# Patient Record
Sex: Female | Born: 1955 | Race: White | Hispanic: No | Marital: Married | Smoking: Current every day smoker
Health system: Southern US, Community
[De-identification: ages and names within clinical notes are randomized; demographics above are authoritative.]

## PROBLEM LIST (undated history)

## (undated) DIAGNOSIS — A4902 Methicillin resistant Staphylococcus aureus infection, unspecified site: Secondary | ICD-10-CM

## (undated) DIAGNOSIS — M130 Polyarthritis, unspecified: Secondary | ICD-10-CM

## (undated) DIAGNOSIS — M3390 Dermatopolymyositis, unspecified, organ involvement unspecified: Secondary | ICD-10-CM

## (undated) DIAGNOSIS — M3313 Other dermatomyositis without myopathy: Secondary | ICD-10-CM

---

## 2009-12-25 ENCOUNTER — Ambulatory Visit: Payer: Self-pay | Admitting: Internal Medicine

## 2009-12-25 IMAGING — US ABDOMEN ULTRASOUND LIMITED
1 series · 17 of 25 positions shown · non-contrast
Comparison: none

REASON FOR EXAM: abd pain
COMMENTS:

[Series 1: abdomen ultrasound limited · 17 of 82 slices shown]
[im 1/82]
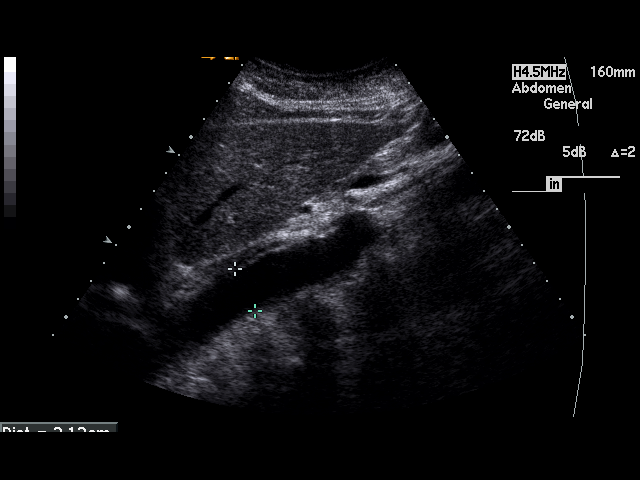
[im 7/82]
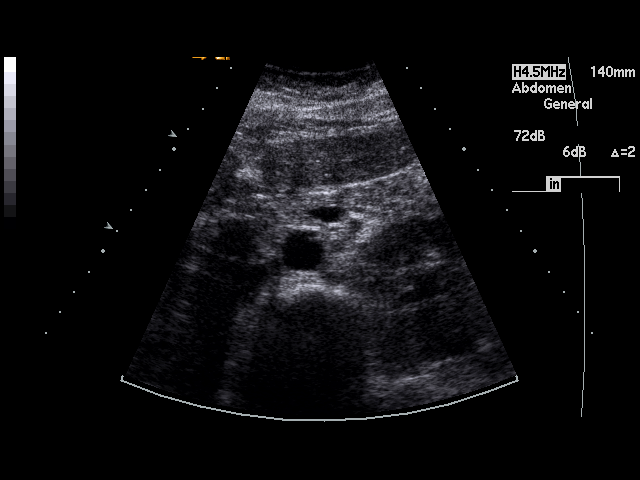
[im 11/82]
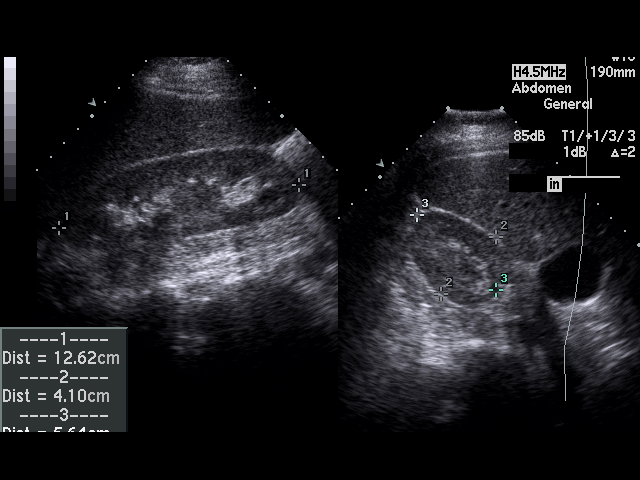
[im 17/82]
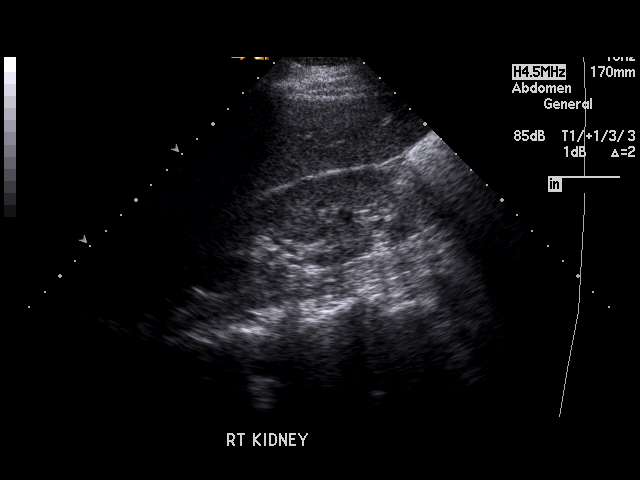
[im 21/82]
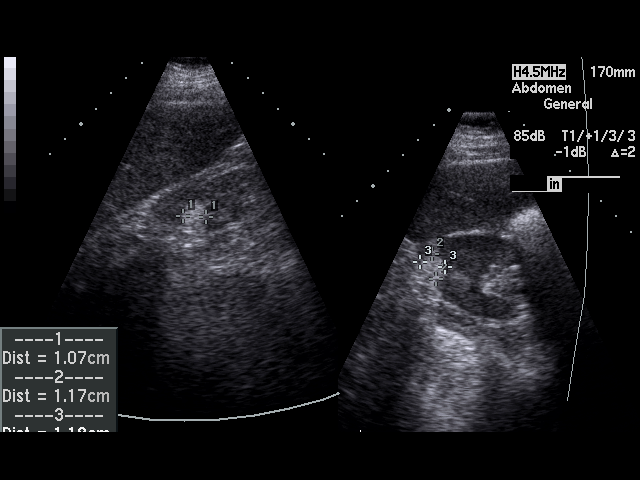
[im 28/82]
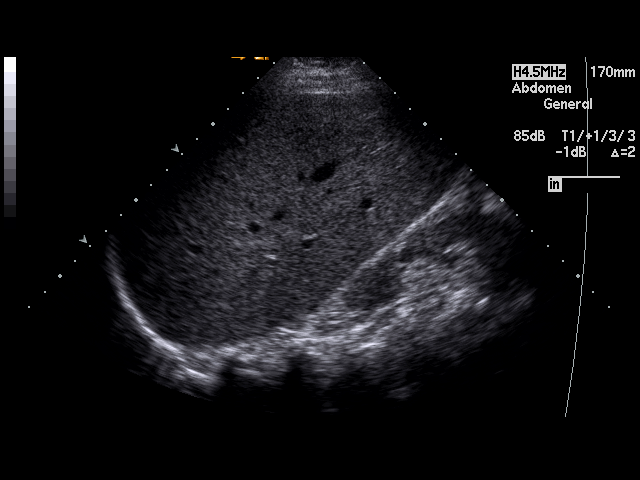
[im 31/82]
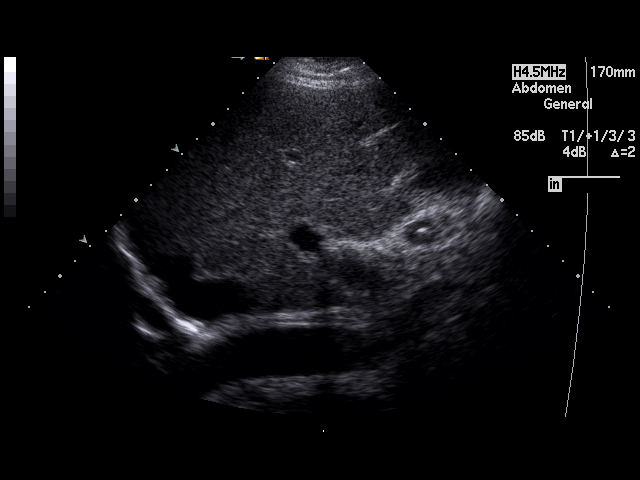
[im 38/82]
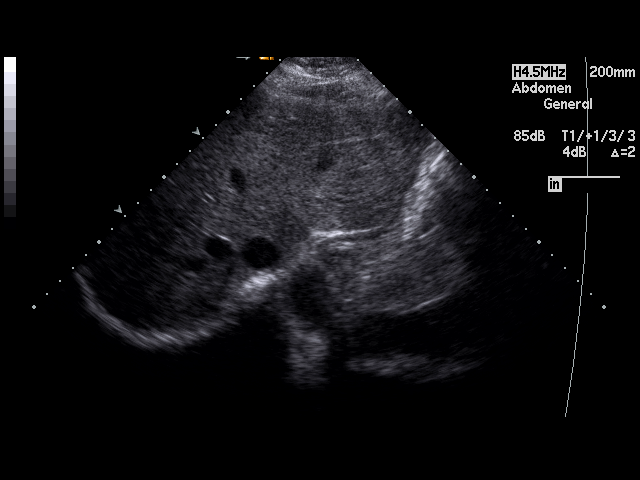
[im 41/82]
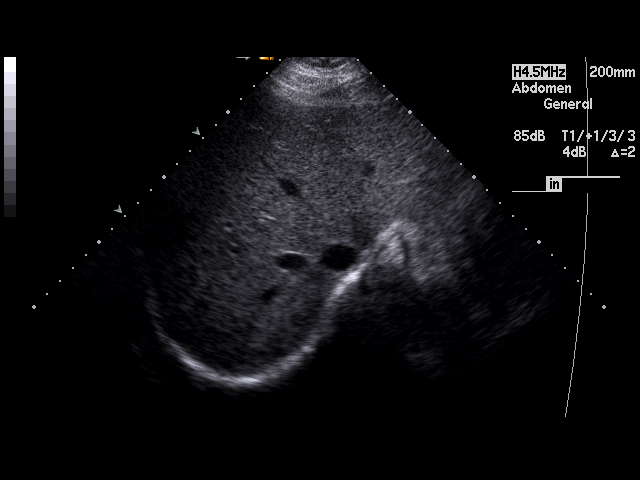
[im 44/82]
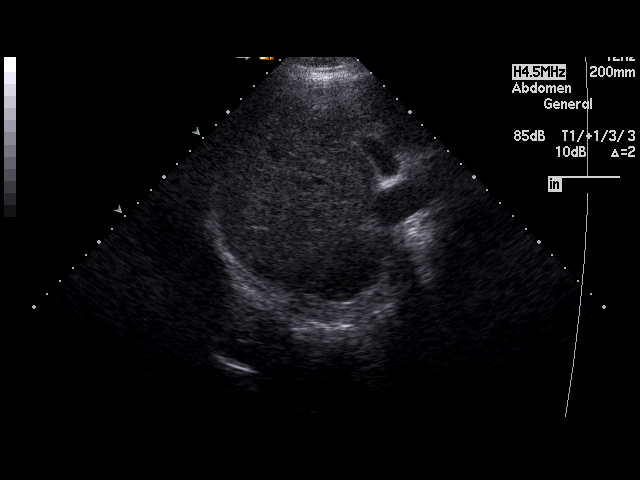
[im 51/82]
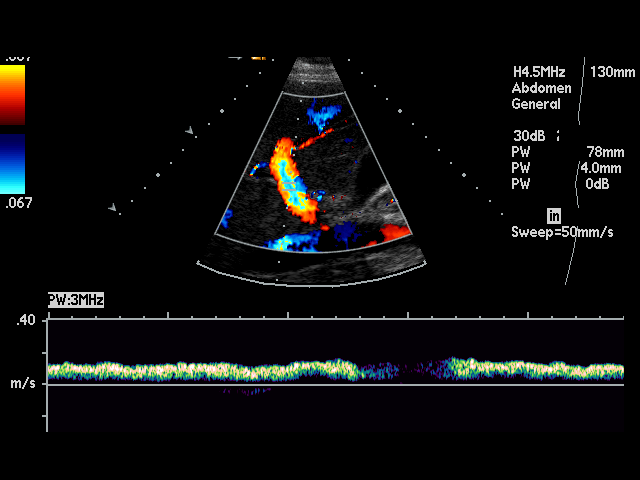
[im 55/82]
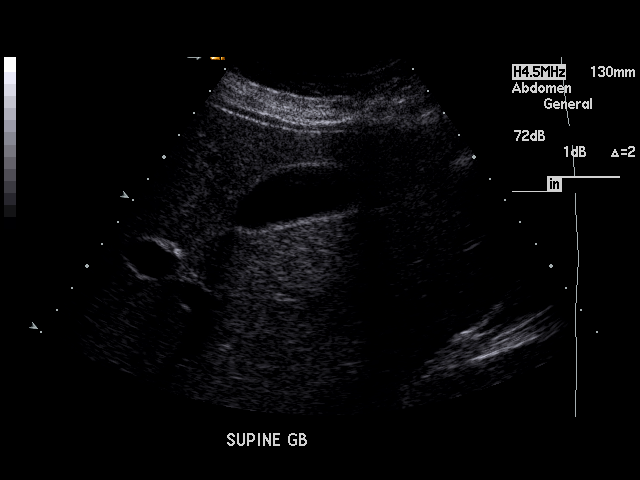
[im 61/82]
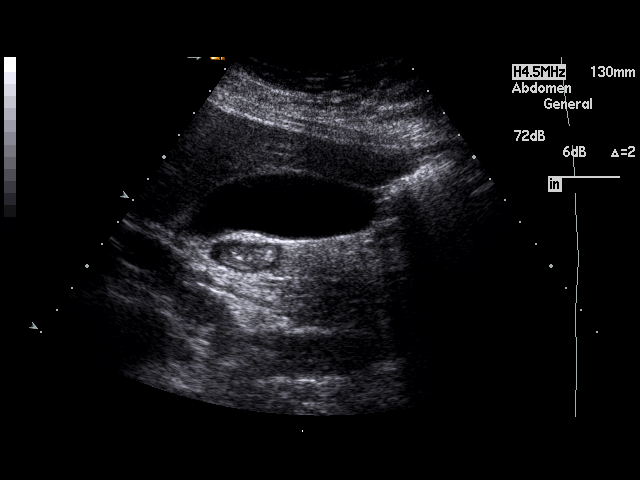
[im 65/82]
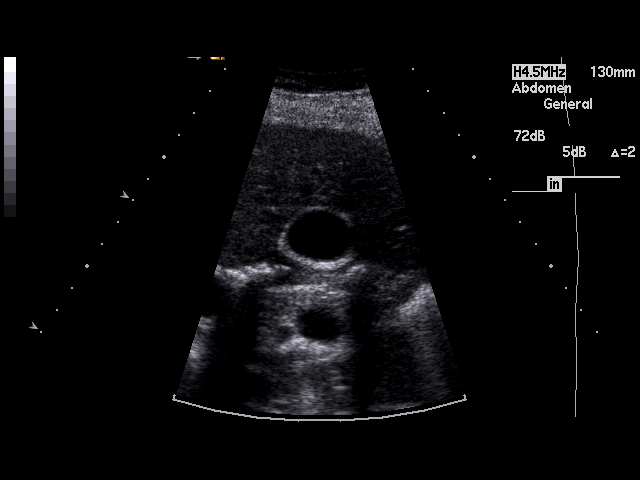
[im 71/82]
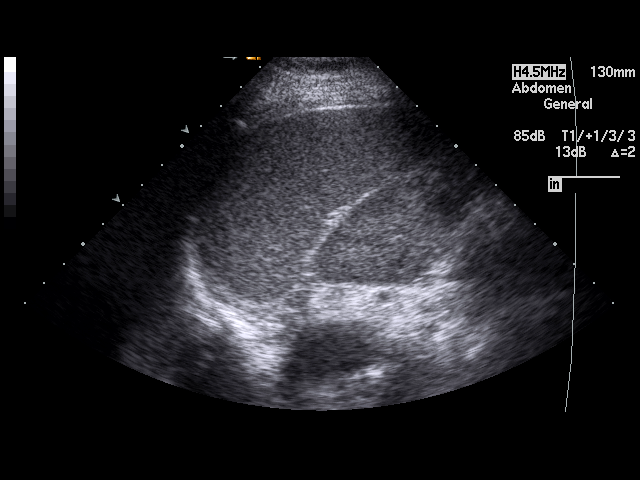
[im 75/82]
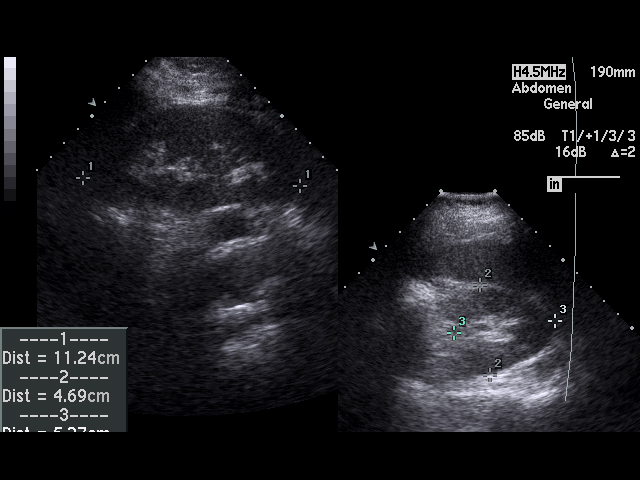
[im 82/82]
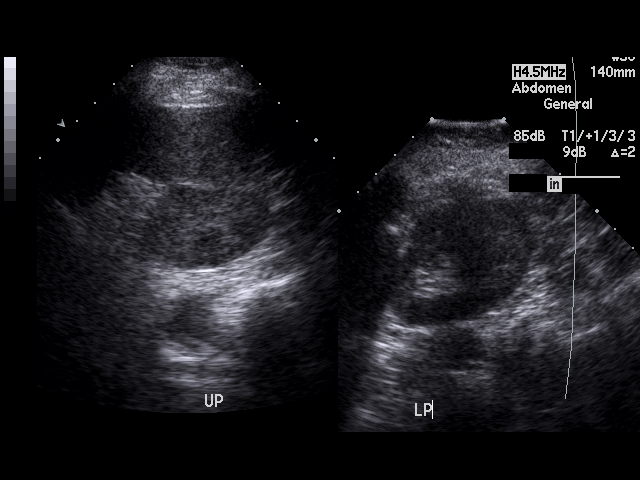

[17 of 25 positions shown; findings below may reference images not displayed]

PROCEDURE:     SONCEK - SONCEK ABDOMEN UPPER GENERAL  - [DATE]  [DATE]

RESULT:     There is a 1.91 cm cyst in the right lobe of the liver. The
hepatic echo pattern otherwise is normal in appearance. The spleen size is
normal. The pancreas, abdominal aorta and inferior vena cava show no
significant abnormalities. No gallstones are seen. There is no thickening of
the gallbladder wall. The common bile duct measures 4 mm in diameter which
is within normal limits. The kidneys show no hydronephrosis. There is a
partially exophytic, hyperechoic mass of the mid portion of the right
kidney. The finding is most compatible with a small angiomyolipoma.
Follow-up examination to document stability is suggested. Sagittally, the
right kidney measures 12.62 cm and the left measures 11.24 cm. No ascites is
seen.
IMPRESSION: 1.  No gallstones or other acute changes identified.
2.  Incidental note is made of a 1.91 cm cyst of the right lobe of the liver.
3.  There is a 1.18 cm, hyperechoic mass of the right kidney consistent with
an angiomyolipoma.

## 2010-08-04 ENCOUNTER — Ambulatory Visit: Payer: Self-pay | Admitting: Gastroenterology

## 2014-08-15 ENCOUNTER — Emergency Department
Admission: EM | Admit: 2014-08-15 | Discharge: 2014-08-15 | Disposition: A | Discharge status: Home / Self Care | Payer: No Typology Code available for payment source | Attending: Emergency Medicine | Admitting: Emergency Medicine

## 2014-08-15 ENCOUNTER — Encounter: Payer: Self-pay | Admitting: Emergency Medicine

## 2014-08-15 DIAGNOSIS — Y9289 Other specified places as the place of occurrence of the external cause: Secondary | ICD-10-CM | POA: Insufficient documentation

## 2014-08-15 DIAGNOSIS — Y9389 Activity, other specified: Secondary | ICD-10-CM | POA: Insufficient documentation

## 2014-08-15 DIAGNOSIS — Y288XXA Contact with other sharp object, undetermined intent, initial encounter: Secondary | ICD-10-CM | POA: Diagnosis not present

## 2014-08-15 DIAGNOSIS — Y998 Other external cause status: Secondary | ICD-10-CM | POA: Insufficient documentation

## 2014-08-15 DIAGNOSIS — S61012A Laceration without foreign body of left thumb without damage to nail, initial encounter: Secondary | ICD-10-CM

## 2014-08-15 MED ORDER — TETANUS-DIPHTH-ACELL PERTUSSIS 5-2.5-18.5 LF-MCG/0.5 IM SUSP
INTRAMUSCULAR | Status: AC
  Filled 2014-08-15: qty 0.5

## 2014-08-15 MED ORDER — TETANUS-DIPHTH-ACELL PERTUSSIS 5-2.5-18.5 LF-MCG/0.5 IM SUSP: 0.5000 mL | Freq: Once | INTRAMUSCULAR | Status: DC

## 2014-08-15 MED ORDER — LIDOCAINE HCL (PF) 1 % IJ SOLN
INTRAMUSCULAR | Status: AC
  Filled 2014-08-15: qty 5

## 2014-08-15 NOTE — ED Notes (Signed)
Using a blade to cut a banner and it slipped and cut left thumb.  Bleeding controlled

## 2014-08-15 NOTE — ED Provider Notes (Signed)
[facility] Emergency Department Provider Note  ____________________________________________  Time seen:  9:51 AM  I have reviewed the triage vital signs and the nursing notes.   HISTORY  Chief Complaint Laceration   HPI Christy Ray is a 59 y.o. female is here with laceration to her left thumb. She is using a X-Acto blade to cut a banner when it slipped and cut her left thumb. She states is probably been over 10 years since her last tetanus shot. Currently her pain is 2 out of 10. She initially went to urgent care where they wrapped it and told her to come to the emergency room. This has  continued to bleed since that time.   History reviewed. No pertinent past medical history.  There are no active problems to display for this patient.   History reviewed. No pertinent past surgical history.  No current outpatient prescriptions on file.  Allergies Review of patient's allergies indicates no known allergies.  History reviewed. No pertinent family history.  Social History History  Substance Use Topics  . Smoking status: Never Smoker   . Smokeless tobacco: Not on file  . Alcohol Use: No    Review of Systems Constitutional: No fever/chills Eyes: No visual changes. ENT: No sore throat. Cardiovascular: Denies chest pain. Respiratory: Denies shortness of breath. Gastrointestinal: No abdominal pain.  No nausea, no vomiting. Genitourinary: Negative for dysuria. Musculoskeletal: Negative for back pain. Skin: Negative for rash. Neurological: Negative for headaches  10-point ROS otherwise negative.  ____________________________________________   PHYSICAL EXAM:  VITAL SIGNS: ED Triage Vitals  Enc Vitals Group     BP 08/15/14 0837 170/105 mmHg     Pulse Rate 08/15/14 0837 77     Resp 08/15/14 0837 16     Temp 08/15/14 0837 98.1 F (36.7 C)     Temp Source 08/15/14 0837 Oral     SpO2 08/15/14 0837 96 %     Weight 08/15/14 0837 150 lb  (68.04 kg)     Height 08/15/14 0837 5\' 5"  (1.651 m)     Head Cir --      Peak Flow --      Pain Score 08/15/14 0835 2     Pain Loc --      Pain Edu? --      Excl. in [place]? --     Constitutional: Alert and oriented. Well appearing and in no acute distress. Eyes: Conjunctivae are normal. PERRL. EOMI. Head: Atraumatic. Nose: No congestion/rhinnorhea. Neck: No stridor.   Cardiovascular: Normal rate, regular rhythm. Grossly normal heart sounds.  Good peripheral circulation. Respiratory: Normal respiratory effort.  No retractions. Lungs CTAB. Gastrointestinal: Soft and nontender. No distention. Musculoskeletal: No lower extremity tenderness nor edema.  No joint effusions. Neurologic:  Normal speech and language. No gross focal neurologic deficits are appreciated. Speech is normal. No gait instability. Skin:  Skin is warm, dry and intact. There is a very superficial skin avulsion to the left thumb laterally. Motor sensory function intact. Psychiatric: Mood and affect are normal. Speech and behavior are normal.  ____________________________________________   LABS (all labs ordered are listed, but only abnormal results are displayed)  Labs Reviewed - No data to display  PROCEDURES  Procedure(s) performed: None  Critical Care performed: No  ____________________________________________   INITIAL IMPRESSION / ASSESSMENT AND PLAN / ED COURSE  Pertinent labs & imaging results that were available during my care of the patient were reviewed by me and considered in my medical decision making (see  chart for details).  After a pressure dressing with surigel there continued to be some slow bleeding. A digital block was performed with 1% lidocaine and at the time the dressing was removed and was no continued bleeding present. Another pressure dressing was placed. Patient is to leave this dressing on for 2 days. ____________________________________________   FINAL CLINICAL IMPRESSION(S) / ED  DIAGNOSES  Final diagnoses:  Laceration of thumb, left, initial encounter      Tommi Rumps, PA-C 08/15/14 1156  Minna Antis, MD 08/15/14 1423

## 2014-08-15 NOTE — Discharge Instructions (Signed)
° °  TYLENOL IF NEEDED FOR PAIN KEEP AREA CLEAN AND DRY.  LEAVE DRESSING ON FOR 2 DAYS AND THEN RE-DRESS

## 2021-07-12 ENCOUNTER — Emergency Department: Payer: Medicare Other

## 2021-07-12 ENCOUNTER — Encounter: Payer: Self-pay | Admitting: Emergency Medicine

## 2021-07-12 ENCOUNTER — Other Ambulatory Visit: Payer: Self-pay

## 2021-07-12 ENCOUNTER — Inpatient Hospital Stay
Admission: EM | Admit: 2021-07-12 | Discharge: 2021-07-20 | DRG: 028 | Disposition: A | Discharge status: Home Health | Payer: Medicare Other | Attending: Internal Medicine | Admitting: Internal Medicine

## 2021-07-12 DIAGNOSIS — M2022 Hallux rigidus, left foot: Secondary | ICD-10-CM | POA: Diagnosis present

## 2021-07-12 DIAGNOSIS — M009 Pyogenic arthritis, unspecified: Secondary | ICD-10-CM | POA: Diagnosis not present

## 2021-07-12 DIAGNOSIS — D84821 Immunodeficiency due to drugs: Secondary | ICD-10-CM | POA: Diagnosis present

## 2021-07-12 DIAGNOSIS — R7881 Bacteremia: Secondary | ICD-10-CM | POA: Diagnosis not present

## 2021-07-12 DIAGNOSIS — L02612 Cutaneous abscess of left foot: Secondary | ICD-10-CM | POA: Diagnosis present

## 2021-07-12 DIAGNOSIS — E2740 Unspecified adrenocortical insufficiency: Secondary | ICD-10-CM | POA: Diagnosis present

## 2021-07-12 DIAGNOSIS — L03116 Cellulitis of left lower limb: Secondary | ICD-10-CM | POA: Diagnosis present

## 2021-07-12 DIAGNOSIS — B9561 Methicillin susceptible Staphylococcus aureus infection as the cause of diseases classified elsewhere: Secondary | ICD-10-CM | POA: Diagnosis not present

## 2021-07-12 DIAGNOSIS — M109 Gout, unspecified: Secondary | ICD-10-CM | POA: Diagnosis present

## 2021-07-12 DIAGNOSIS — M00072 Staphylococcal arthritis, left ankle and foot: Secondary | ICD-10-CM | POA: Diagnosis present

## 2021-07-12 DIAGNOSIS — E876 Hypokalemia: Secondary | ICD-10-CM | POA: Diagnosis present

## 2021-07-12 DIAGNOSIS — G062 Extradural and subdural abscess, unspecified: Secondary | ICD-10-CM | POA: Diagnosis not present

## 2021-07-12 DIAGNOSIS — Z7952 Long term (current) use of systemic steroids: Secondary | ICD-10-CM | POA: Diagnosis not present

## 2021-07-12 DIAGNOSIS — I10 Essential (primary) hypertension: Secondary | ICD-10-CM | POA: Diagnosis present

## 2021-07-12 DIAGNOSIS — A419 Sepsis, unspecified organism: Principal | ICD-10-CM

## 2021-07-12 DIAGNOSIS — B9562 Methicillin resistant Staphylococcus aureus infection as the cause of diseases classified elsewhere: Secondary | ICD-10-CM

## 2021-07-12 DIAGNOSIS — G061 Intraspinal abscess and granuloma: Principal | ICD-10-CM

## 2021-07-12 DIAGNOSIS — Z79624 Long term (current) use of inhibitors of nucleotide synthesis: Secondary | ICD-10-CM

## 2021-07-12 DIAGNOSIS — F17210 Nicotine dependence, cigarettes, uncomplicated: Secondary | ICD-10-CM | POA: Diagnosis present

## 2021-07-12 DIAGNOSIS — M3390 Dermatopolymyositis, unspecified, organ involvement unspecified: Secondary | ICD-10-CM | POA: Diagnosis present

## 2021-07-12 DIAGNOSIS — E871 Hypo-osmolality and hyponatremia: Secondary | ICD-10-CM | POA: Diagnosis present

## 2021-07-12 DIAGNOSIS — K6812 Psoas muscle abscess: Secondary | ICD-10-CM

## 2021-07-12 DIAGNOSIS — M3313 Other dermatomyositis without myopathy: Secondary | ICD-10-CM | POA: Diagnosis present

## 2021-07-12 DIAGNOSIS — F17200 Nicotine dependence, unspecified, uncomplicated: Secondary | ICD-10-CM | POA: Diagnosis present

## 2021-07-12 DIAGNOSIS — M86172 Other acute osteomyelitis, left ankle and foot: Secondary | ICD-10-CM | POA: Diagnosis present

## 2021-07-12 DIAGNOSIS — K5900 Constipation, unspecified: Secondary | ICD-10-CM | POA: Diagnosis present

## 2021-07-12 DIAGNOSIS — E1169 Type 2 diabetes mellitus with other specified complication: Secondary | ICD-10-CM | POA: Diagnosis present

## 2021-07-12 DIAGNOSIS — E785 Hyperlipidemia, unspecified: Secondary | ICD-10-CM | POA: Diagnosis present

## 2021-07-12 DIAGNOSIS — D649 Anemia, unspecified: Secondary | ICD-10-CM | POA: Diagnosis present

## 2021-07-12 DIAGNOSIS — H3530 Unspecified macular degeneration: Secondary | ICD-10-CM | POA: Diagnosis present

## 2021-07-12 HISTORY — DX: Other dermatomyositis without myopathy: M33.13

## 2021-07-12 HISTORY — DX: Polyarthritis, unspecified: M13.0

## 2021-07-12 HISTORY — DX: Dermatopolymyositis, unspecified, organ involvement unspecified: M33.90

## 2021-07-12 LAB — COMPREHENSIVE METABOLIC PANEL
ALT: 39 U/L (ref 0–44)
AST: 20 U/L (ref 15–41)
Albumin: 3 g/dL — ABNORMAL LOW (ref 3.5–5.0)
Alkaline Phosphatase: 384 U/L — ABNORMAL HIGH (ref 38–126)
Anion gap: 12 (ref 5–15)
BUN: 28 mg/dL — ABNORMAL HIGH (ref 8–23)
CO2: 26 mmol/L (ref 22–32)
Calcium: 9 mg/dL (ref 8.9–10.3)
Chloride: 99 mmol/L (ref 98–111)
Creatinine, Ser: 0.69 mg/dL (ref 0.44–1.00)
GFR, Estimated: >60 mL/min (ref >60)
Glucose, Bld: 117 mg/dL — ABNORMAL HIGH (ref 70–99)
Potassium: 2.6 mmol/L — CL (ref 3.5–5.1)
Sodium: 137 mmol/L (ref 135–145)
Total Bilirubin: 1.5 mg/dL — ABNORMAL HIGH (ref 0.3–1.2)
Total Protein: 6.9 g/dL (ref 6.5–8.1)

## 2021-07-12 LAB — URINALYSIS, ROUTINE W REFLEX MICROSCOPIC
Bilirubin Urine: NEGATIVE
Glucose, UA: NEGATIVE mg/dL
Hgb urine dipstick: NEGATIVE
Ketones, ur: 5 mg/dL — AB
Leukocytes,Ua: NEGATIVE
Nitrite: NEGATIVE
Protein, ur: NEGATIVE mg/dL
Specific Gravity, Urine: 1.019 (ref 1.005–1.030)
pH: 5 (ref 5.0–8.0)

## 2021-07-12 LAB — CBC WITH DIFFERENTIAL/PLATELET
Abs Immature Granulocytes: 0.98 10*3/uL — ABNORMAL HIGH (ref 0.00–0.07)
Basophils Absolute: 0.1 10*3/uL (ref 0.0–0.1)
Basophils Relative: 1 %
Eosinophils Absolute: 0 10*3/uL (ref 0.0–0.5)
Eosinophils Relative: 0 %
HCT: 40.1 % (ref 36.0–46.0)
Hemoglobin: 13.6 g/dL (ref 12.0–15.0)
Immature Granulocytes: 4 %
Lymphocytes Relative: 6 %
Lymphs Abs: 1.5 10*3/uL (ref 0.7–4.0)
MCH: 28.2 pg (ref 26.0–34.0)
MCHC: 33.9 g/dL (ref 30.0–36.0)
MCV: 83 fL (ref 80.0–100.0)
Monocytes Absolute: 1.1 10*3/uL — ABNORMAL HIGH (ref 0.1–1.0)
Monocytes Relative: 5 %
Neutro Abs: 19.8 10*3/uL — ABNORMAL HIGH (ref 1.7–7.7)
Neutrophils Relative %: 84 %
Platelets: 177 10*3/uL (ref 150–400)
RBC: 4.83 MIL/uL (ref 3.87–5.11)
RDW: 16.1 % — ABNORMAL HIGH (ref 11.5–15.5)
WBC: 23.6 10*3/uL — ABNORMAL HIGH (ref 4.0–10.5)
nRBC: 0 % (ref 0.0–0.2)

## 2021-07-12 LAB — LACTIC ACID, PLASMA
Lactic Acid, Venous: 1.5 mmol/L (ref 0.5–1.9)
Lactic Acid, Venous: 1.2 mmol/L (ref 0.5–1.9)

## 2021-07-12 LAB — PROCALCITONIN: Procalcitonin: 0.72 ng/mL

## 2021-07-12 LAB — SEDIMENTATION RATE: Sed Rate: 68 mm/hr — ABNORMAL HIGH (ref 0–30)

## 2021-07-12 LAB — MAGNESIUM: Magnesium: 2.2 mg/dL (ref 1.7–2.4)

## 2021-07-12 LAB — HIV ANTIBODY (ROUTINE TESTING W REFLEX): HIV Screen 4th Generation wRfx: NONREACTIVE

## 2021-07-12 LAB — URIC ACID: Uric Acid, Serum: 2.5 mg/dL (ref 2.5–7.1)

## 2021-07-12 IMAGING — CR DG FOOT COMPLETE 3+V*L*
1 series · 3 of 3 positions shown · non-contrast
Comparison: None Available.

CLINICAL DATA: Left foot pain with erythema and swelling of the MCP
joint for 4-5 days. Question gout.

EXAM:
LEFT FOOT - COMPLETE 3+ VIEW

[Series 1: dg foot complete left · 0.14mm/px · 3 of 3 slices shown]
[im 1/3]
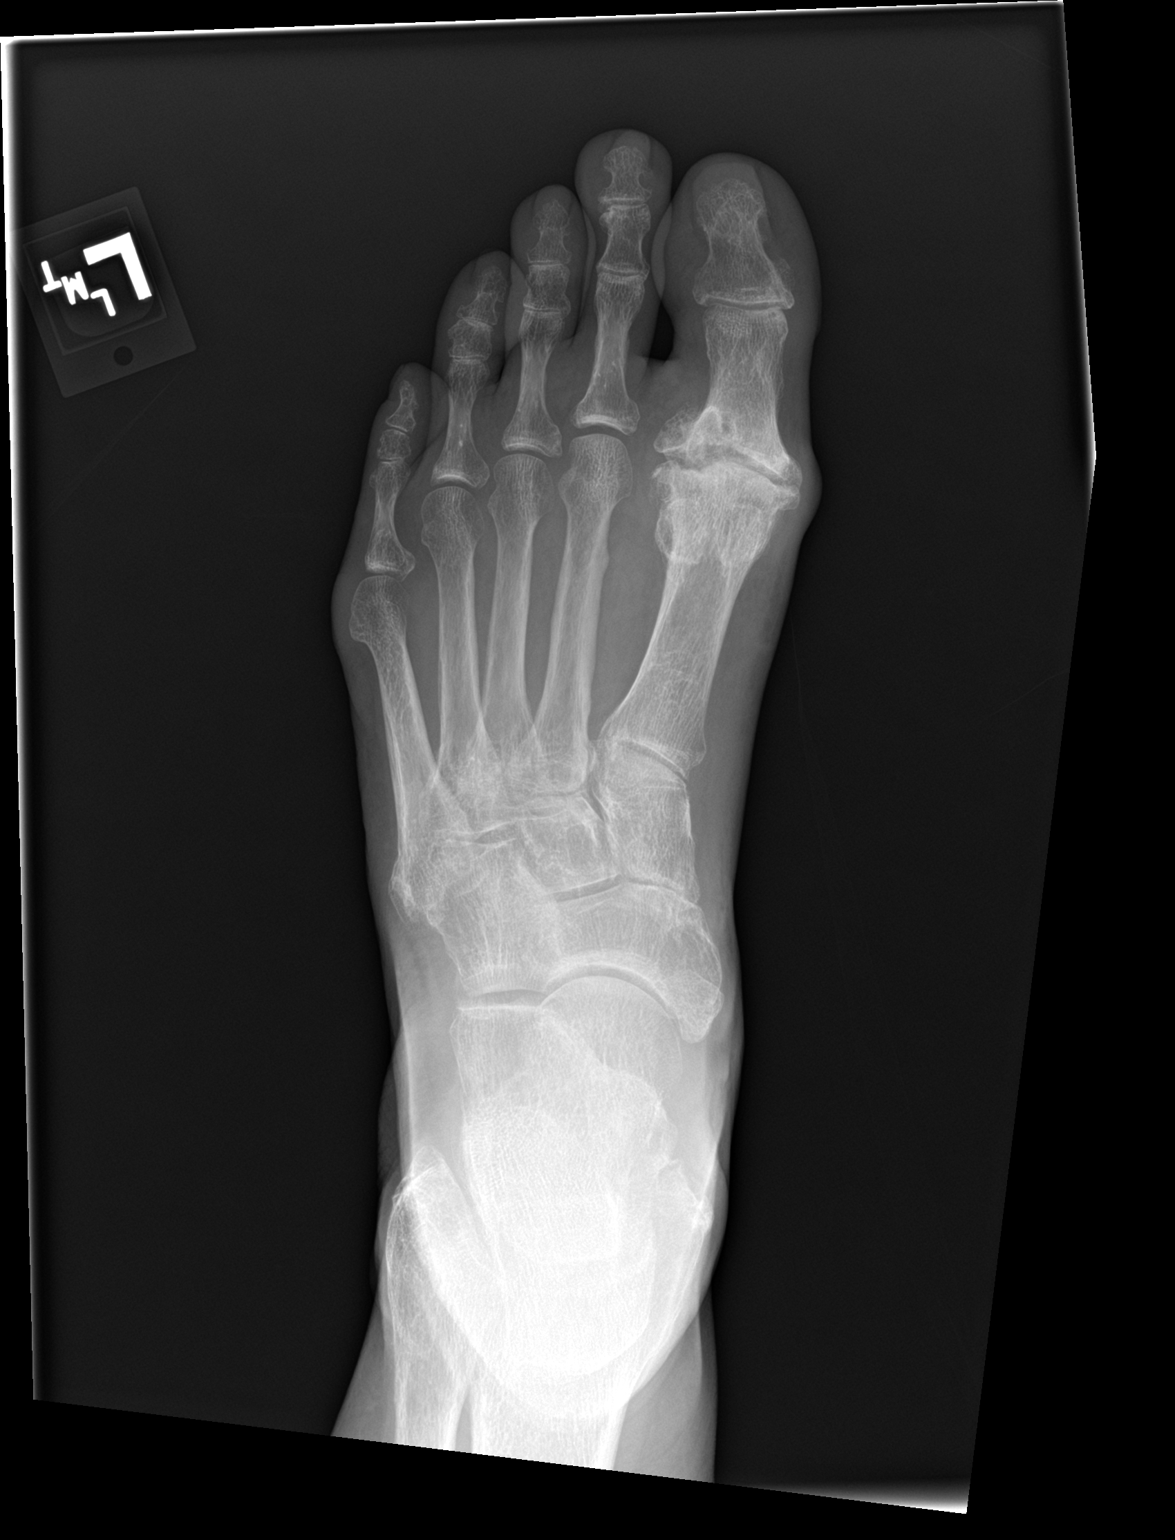
[im 2/3]
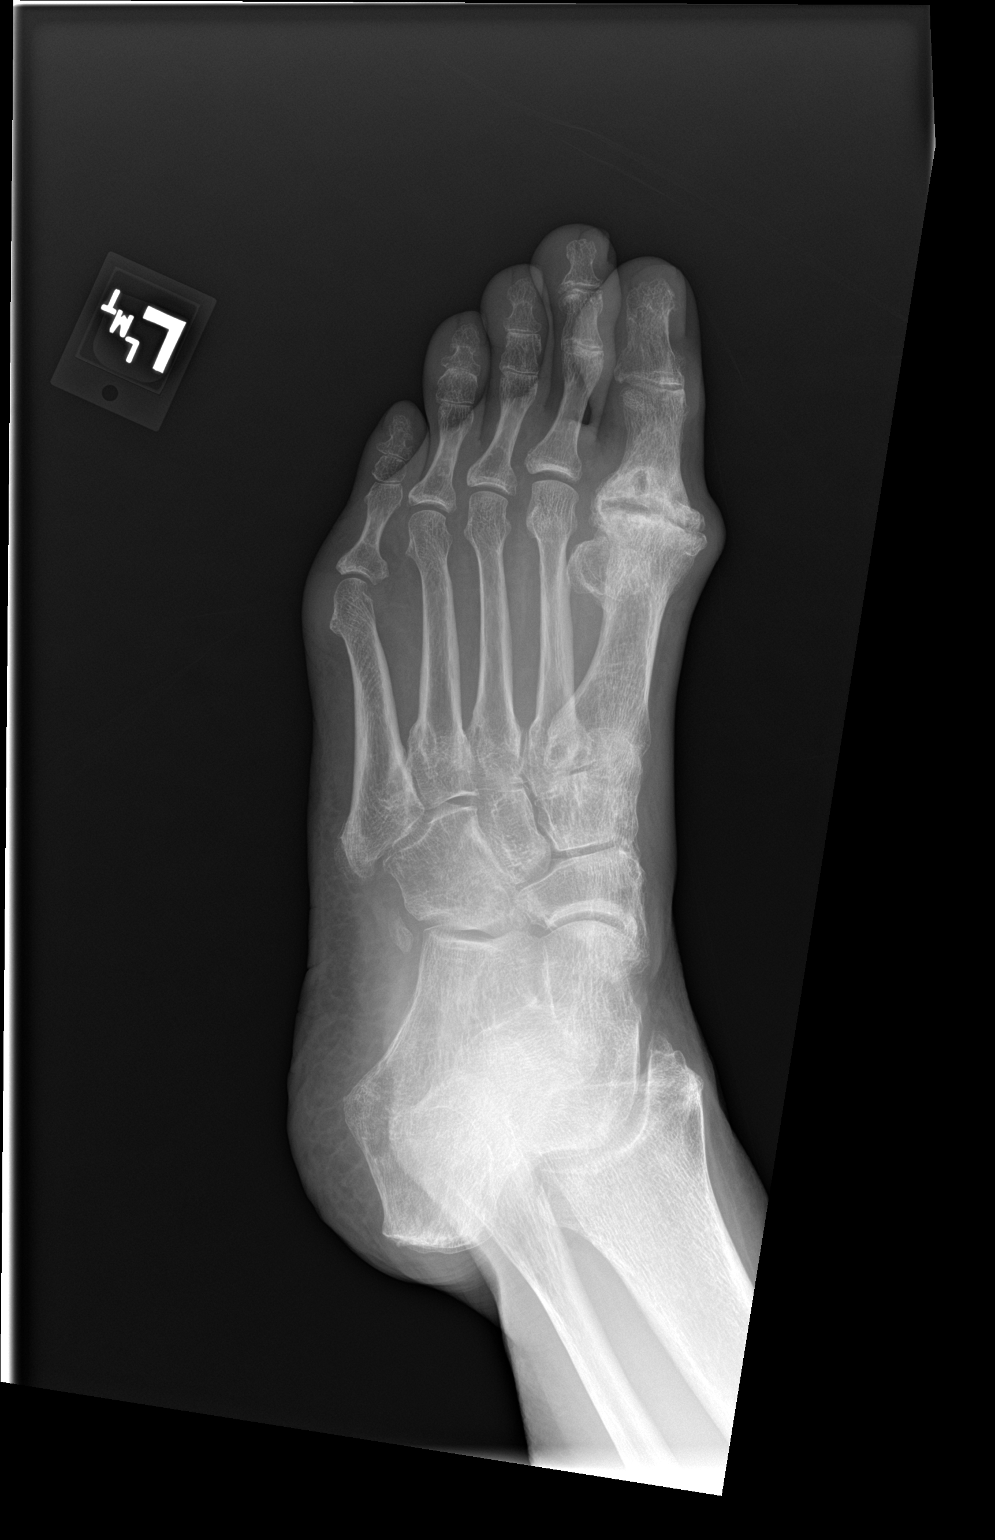
[im 3/3]
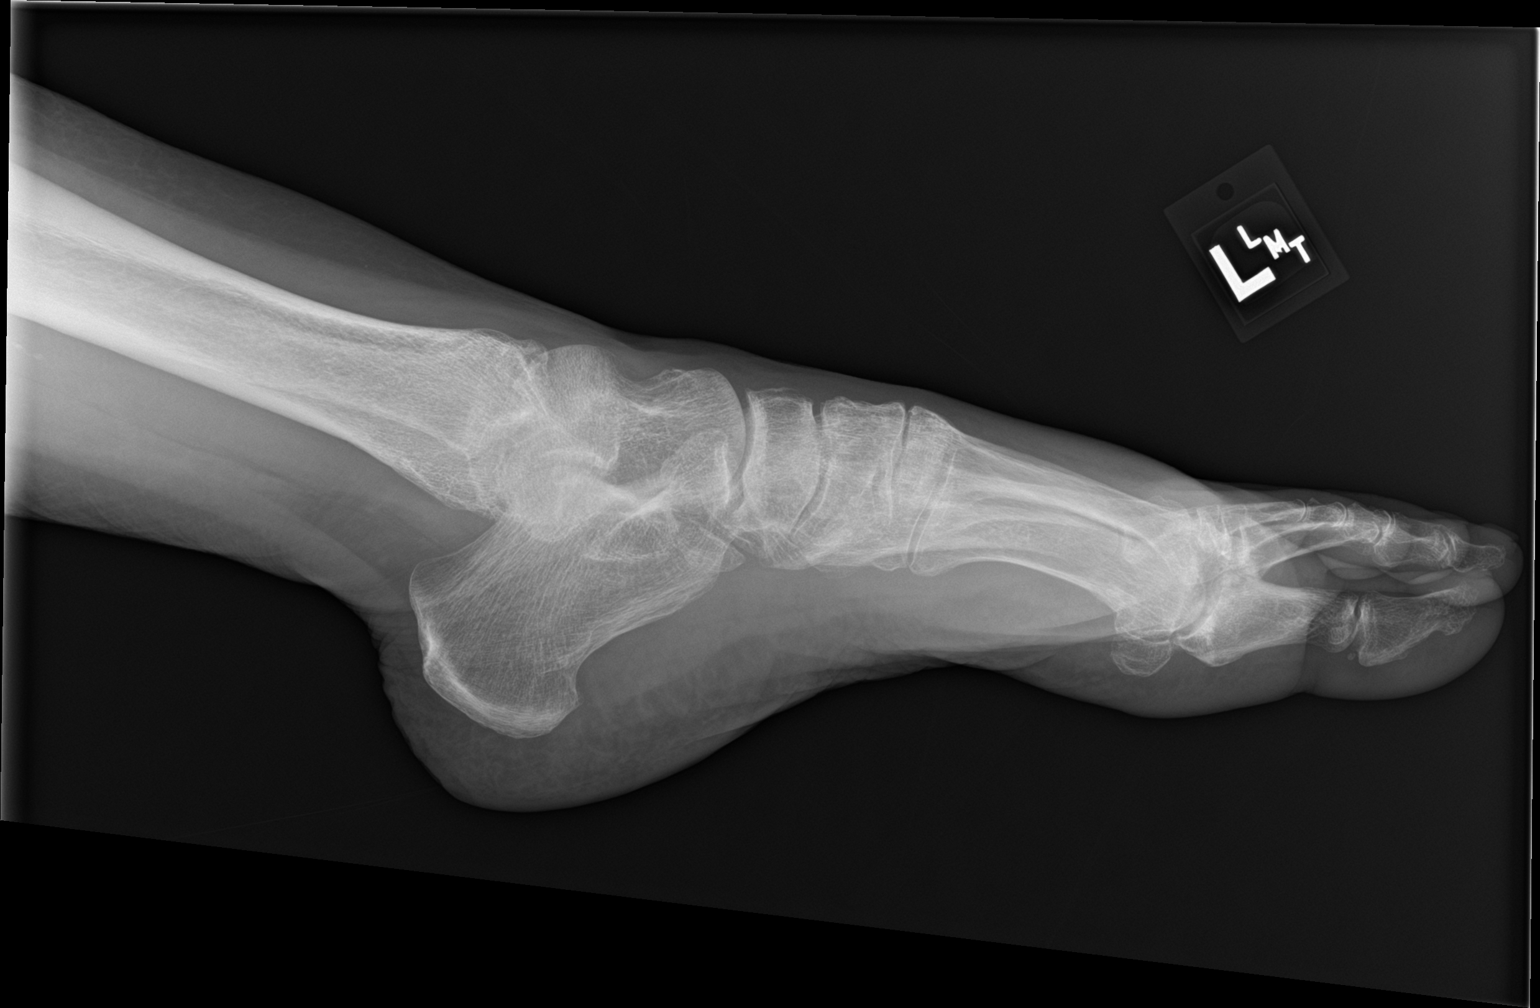

[3 of 3 positions shown; findings below may reference images not displayed]

FINDINGS: The mineralization and alignment are normal. There is no evidence of
acute fracture or dislocation. Advanced osteoarthritis at the 1st
metatarsophalangeal joint with joint space narrowing and
osteophytes. No erosive changes or soft tissue calcifications are
identified. Mild midfoot degenerative changes. There is mild
forefoot soft tissue swelling.
IMPRESSION: No acute osseous findings or specific radiographic findings of gout.
Advanced degenerative changes at the 1st metatarsophalangeal joint
with mild nonspecific forefoot soft tissue swelling.

## 2021-07-12 MED ORDER — POTASSIUM CHLORIDE CRYS ER 20 MEQ PO TBCR
40.0000 meq | EXTENDED_RELEASE_TABLET | Freq: Three times a day (TID) | ORAL | Status: DC
  Administered 2021-07-12: 40 meq via ORAL
  Filled 2021-07-12: qty 2

## 2021-07-12 MED ORDER — LOSARTAN POTASSIUM 50 MG PO TABS
100.0000 mg | ORAL_TABLET | Freq: Every day | ORAL | Status: DC
  Administered 2021-07-12 – 2021-07-20 (×8): 100 mg via ORAL
  Filled 2021-07-12 (×8): qty 2

## 2021-07-12 MED ORDER — PREDNISONE 20 MG PO TABS
30.0000 mg | ORAL_TABLET | Freq: Every day | ORAL | Status: DC
  Administered 2021-07-12: 30 mg via ORAL
  Filled 2021-07-12: qty 1

## 2021-07-12 MED ORDER — PREDNISONE 20 MG PO TABS
40.0000 mg | ORAL_TABLET | Freq: Every day | ORAL | Status: DC
  Administered 2021-07-13: 40 mg via ORAL
  Filled 2021-07-12: qty 2

## 2021-07-12 MED ORDER — HYDROCHLOROTHIAZIDE 12.5 MG PO TABS
12.5000 mg | ORAL_TABLET | Freq: Every day | ORAL | Status: DC
Start: 2021-07-12 — End: 2021-07-16
  Administered 2021-07-12 – 2021-07-16 (×4): 12.5 mg via ORAL
  Filled 2021-07-12 (×4): qty 1

## 2021-07-12 MED ORDER — ONDANSETRON HCL 4 MG PO TABS: 4.0000 mg | ORAL_TABLET | Freq: Four times a day (QID) | ORAL | Status: DC | PRN

## 2021-07-12 MED ORDER — FOLIC ACID 1 MG PO TABS
1.0000 mg | ORAL_TABLET | Freq: Every day | ORAL | Status: DC
  Administered 2021-07-13 – 2021-07-20 (×7): 1 mg via ORAL
  Filled 2021-07-12 (×7): qty 1

## 2021-07-12 MED ORDER — HYDROCHLOROTHIAZIDE 12.5 MG PO TABS: 12.5000 mg | ORAL_TABLET | Freq: Every day | ORAL | Status: DC

## 2021-07-12 MED ORDER — LOSARTAN POTASSIUM 50 MG PO TABS: 100.0000 mg | ORAL_TABLET | Freq: Every day | ORAL | Status: DC

## 2021-07-12 MED ORDER — POTASSIUM CHLORIDE CRYS ER 20 MEQ PO TBCR
40.0000 meq | EXTENDED_RELEASE_TABLET | Freq: Three times a day (TID) | ORAL | Status: AC
  Administered 2021-07-12: 40 meq via ORAL
  Filled 2021-07-12: qty 2

## 2021-07-12 MED ORDER — PREDNISONE 10 MG PO TABS
10.0000 mg | ORAL_TABLET | Freq: Once | ORAL | Status: AC
  Administered 2021-07-12: 10 mg via ORAL
  Filled 2021-07-12: qty 1

## 2021-07-12 MED ORDER — SODIUM CHLORIDE 0.9 % IV SOLN
2.0000 g | INTRAVENOUS | Status: DC
  Administered 2021-07-12: 2 g via INTRAVENOUS
  Filled 2021-07-12: qty 20

## 2021-07-12 MED ORDER — LOSARTAN POTASSIUM-HCTZ 100-12.5 MG PO TABS: 1.0000 | ORAL_TABLET | Freq: Every day | ORAL | Status: DC

## 2021-07-12 MED ORDER — ONDANSETRON HCL 4 MG/2ML IJ SOLN
4.0000 mg | Freq: Four times a day (QID) | INTRAMUSCULAR | Status: DC | PRN
  Administered 2021-07-12 – 2021-07-14 (×3): 4 mg via INTRAVENOUS
  Filled 2021-07-12 (×3): qty 2

## 2021-07-12 MED ORDER — SODIUM CHLORIDE 0.9 % IV SOLN
250.0000 mL | INTRAVENOUS | Status: DC | PRN
  Administered 2021-07-13 – 2021-07-19 (×3): 250 mL via INTRAVENOUS

## 2021-07-12 MED ORDER — MYCOPHENOLATE MOFETIL 250 MG PO CAPS
1000.0000 mg | ORAL_CAPSULE | Freq: Two times a day (BID) | ORAL | Status: DC
  Administered 2021-07-12 – 2021-07-13 (×2): 1000 mg via ORAL
  Filled 2021-07-12 (×2): qty 4

## 2021-07-12 MED ORDER — SODIUM CHLORIDE 0.9 % IV BOLUS
1000.0000 mL | Freq: Once | INTRAVENOUS | Status: AC
  Administered 2021-07-12: 1000 mL via INTRAVENOUS

## 2021-07-12 MED ORDER — LACTATED RINGERS IV SOLN: INTRAVENOUS | Status: DC

## 2021-07-12 MED ORDER — HYDROXYZINE HCL 10 MG PO TABS
10.0000 mg | ORAL_TABLET | Freq: Every day | ORAL | Status: DC
  Administered 2021-07-12 – 2021-07-19 (×8): 10 mg via ORAL
  Filled 2021-07-12 (×9): qty 1

## 2021-07-12 MED ORDER — AMLODIPINE BESYLATE 5 MG PO TABS
2.5000 mg | ORAL_TABLET | Freq: Every day | ORAL | Status: DC
Start: 2021-07-13 — End: 2021-07-20
  Administered 2021-07-13 – 2021-07-20 (×7): 2.5 mg via ORAL
  Filled 2021-07-12 (×7): qty 1

## 2021-07-12 MED ORDER — SODIUM CHLORIDE 0.9% FLUSH
3.0000 mL | Freq: Two times a day (BID) | INTRAVENOUS | Status: DC
  Administered 2021-07-12 – 2021-07-20 (×15): 3 mL via INTRAVENOUS

## 2021-07-12 MED ORDER — ROSUVASTATIN CALCIUM 10 MG PO TABS
10.0000 mg | ORAL_TABLET | Freq: Every day | ORAL | Status: DC
  Administered 2021-07-13 – 2021-07-20 (×7): 10 mg via ORAL
  Filled 2021-07-12 (×7): qty 1

## 2021-07-12 MED ORDER — CYCLOBENZAPRINE HCL 10 MG PO TABS
5.0000 mg | ORAL_TABLET | Freq: Three times a day (TID) | ORAL | Status: DC | PRN
  Administered 2021-07-12 – 2021-07-15 (×5): 5 mg via ORAL
  Filled 2021-07-12 (×5): qty 1

## 2021-07-12 MED ORDER — SODIUM CHLORIDE 0.9% FLUSH: 3.0000 mL | INTRAVENOUS | Status: DC | PRN

## 2021-07-12 MED ORDER — ENOXAPARIN SODIUM 40 MG/0.4ML IJ SOSY
40.0000 mg | PREFILLED_SYRINGE | INTRAMUSCULAR | Status: DC
  Administered 2021-07-12 – 2021-07-13 (×2): 40 mg via SUBCUTANEOUS
  Filled 2021-07-12 (×2): qty 0.4

## 2021-07-12 MED ORDER — KETOROLAC TROMETHAMINE 15 MG/ML IJ SOLN
15.0000 mg | Freq: Four times a day (QID) | INTRAMUSCULAR | Status: AC | PRN
  Administered 2021-07-12 – 2021-07-13 (×3): 15 mg via INTRAVENOUS
  Filled 2021-07-12 (×3): qty 1

## 2021-07-12 NOTE — ED Triage Notes (Signed)
Pt to ED via POV for back pain and left foot pain. Pt states that she thinks she may have gout in her foot. Pt states that the foot pain has been going on for a few days. Pt is in NAD.  ?

## 2021-07-12 NOTE — Assessment & Plan Note (Addendum)
No known history of gout but presents for evaluation of pain, redness and swelling involving the left great toe. ?Uric acid is within normal limits ?Inflammatory markers are elevated ?Continue prednisone 40 mg daily ?IV Toradol as needed for pain ?

## 2021-07-12 NOTE — ED Notes (Signed)
Informed RN bed assigned 

## 2021-07-12 NOTE — Assessment & Plan Note (Signed)
Blood pressure is stable ?Continue losartan - hydrochlorothiazide as well as amlodipine ?

## 2021-07-12 NOTE — Assessment & Plan Note (Signed)
Stable ?Continue CellCept and prednisone ?Follow-up with rheumatology as an outpatient ?

## 2021-07-12 NOTE — Sepsis Progress Note (Signed)
Code Sepsis protocol being monitored by eLink. 

## 2021-07-12 NOTE — Assessment & Plan Note (Addendum)
Related to hydrochlorothiazide use ?Supplement potassium ?Check magnesium levels ?

## 2021-07-12 NOTE — Assessment & Plan Note (Signed)
Smoking cessation was discussed with patient in detail She declines a nicotine transdermal patch at this time 

## 2021-07-12 NOTE — H&P (Addendum)
?History and Physical  ? ? ?Patient: Christy Ray DOB: 24-Jul-1955 ?DOA: 07/12/2021 ?DOS: the patient was seen and examined on 07/12/2021 ?PCP: Wardell Honour, MD  ?Patient coming from: Home ? ?Chief Complaint:  ?Chief Complaint  ?Patient presents with  ? Back Pain  ? Foot Pain  ? ?HPI: Christy Ray is a 66 y.o. female with medical history significant for polyarthritis, dermatomyositis who presents to the emergency room for evaluation of worsening pain, swelling and redness involving the left great toe. ?Patient states that symptoms started about 4 days prior to her presentation and has progressively worsened.  She notes swelling involving the left great toe with associated redness and pain which she rates about a 7 x 10 in intensity at its worst.  She denies any trauma and does not have a known history of gout. ?She denies having any fever, no chills, no abdominal pain, no changes in her bowel habits, no headache, no dizziness, no lightheadedness, no urinary symptoms, no blurred vision or focal deficit. ? ? ?Review of Systems: As mentioned in the history of present illness. All other systems reviewed and are negative. ?Past Medical History:  ?Diagnosis Date  ? Dermatomyositis ([place])   ? Polyarthritis   ? ?History reviewed. No pertinent surgical history. ?Social History:  reports that she has been smoking cigarettes. She has been smoking an average of .5 packs per day. She has never used smokeless tobacco. She reports that she does not drink alcohol and does not use drugs. ? ?No Known Allergies ? ?History reviewed. No pertinent family history. ? ?Prior to Admission medications   ?Not on File  ? ? ?Physical Exam: ?Vitals:  ? 07/12/21 0841 07/12/21 0843 07/12/21 1205 07/12/21 1250  ?BP: (!) 135/91  (!) 147/84 (!) 146/97  ?Pulse: (!) 111  89 93  ?Resp: 16  14 16   ?Temp:  97.6 ?F (36.4 ?C)  97.8 ?F (36.6 ?C)  ?TempSrc:  Oral    ?SpO2: 98%  99% 94%  ?Weight:  60.3 kg    ?Height:  5\' 5"  (1.651 m)     ? ?Physical Exam ?Vitals and nursing note reviewed.  ?Constitutional:   ?   Appearance: Normal appearance.  ?HENT:  ?   Head: Normocephalic.  ?   Nose: Nose normal.  ?   Mouth/Throat:  ?   Mouth: Mucous membranes are moist.  ?Eyes:  ?   Pupils: Pupils are equal, round, and reactive to light.  ?Cardiovascular:  ?   Rate and Rhythm: Tachycardia present.  ?Pulmonary:  ?   Effort: Pulmonary effort is normal.  ?   Breath sounds: Normal breath sounds.  ?Abdominal:  ?   General: Abdomen is flat. Bowel sounds are normal.  ?   Palpations: Abdomen is soft.  ?Musculoskeletal:  ?   Cervical back: Normal range of motion and neck supple.  ?   Comments: Decreased range of motion involving the left great toe.  Swelling, redness involving the left great toe and extending to the dorsum of the left foot  ?Neurological:  ?   Mental Status: She is alert.  ? ? ?Data Reviewed: ?Relevant notes from primary care and specialist visits, past discharge summaries as available in EHR, including Care Everywhere. ?Prior diagnostic testing as pertinent to current admission diagnoses ?Updated medications and problem lists for reconciliation ?ED course, including vitals, labs, imaging, treatment and response to treatment ?Triage notes, nursing and pharmacy notes and ED provider's notes ?Notable results as noted in HPI ?Labs  reviewed.  Magnesium 2.2, lactic acid 1.5, procalcitonin 0.72, white count 23.6, hemoglobin 13.6, hematocrit 40.1, RDW 16.1, platelet count 177, sed rate 68, sodium 137, potassium 2.6, chloride 99, bicarb 26, glucose 117, BUN 28, creatinine 0.69, calcium 9.0, total protein 6.9, albumin 3.0, AST 20, ALT 39, alkaline phosphatase 284, total bilirubin 1.5, uric acid 2.5 ?Left foot x-ray shows No acute osseous findings or specific radiographic findings of gout. Advanced degenerative changes at the 1st metatarsophalangeal joint with mild nonspecific forefoot soft tissue swelling. ?There are no new results to review at this  time. ? ?Assessment and Plan: ?* Cellulitis of left foot ?Patient presents for evaluation of swelling, redness and pain involving the left great toe with streaking onto the dorsum of the left foot ?She has marked leukocytosis and elevated procalcitonin levels ?Continue Rocephin initiated in the ER ?Follow-up results of blood cultures ? ?Gout ?No known history of gout but presents for evaluation of pain, redness and swelling involving the left great toe. ?Uric acid is within normal limits ?Inflammatory markers are elevated ?Continue prednisone 40 mg daily ?IV Toradol as needed for pain ? ?Nicotine dependence ?Smoking cessation was discussed with patient in detail ?She declines a nicotine transdermal patch at this time ? ?Dermatomyositis ([place]) ?Stable ?Continue CellCept and prednisone ?Follow-up with rheumatology as an outpatient ? ?Essential hypertension ?Blood pressure is stable ?Continue losartan - hydrochlorothiazide as well as amlodipine ? ?Hypokalemia ?Related to hydrochlorothiazide use ?Supplement potassium ?Check magnesium levels ? ? ? ? ? Advance Care Planning:   Code Status: Full Code  ? ?Consults: None ? ?Family Communication: Greater than 50% of time was spent discussing patient's condition and plan of care with her and her husband at the bedside.  All questions and concerns have been addressed.  They verbalized understanding and agree with the plan. ? ?Severity of Illness: ?The appropriate patient status for this patient is INPATIENT. Inpatient status is judged to be reasonable and necessary in order to provide the required intensity of service to ensure the patient's safety. The patient's presenting symptoms, physical exam findings, and initial radiographic and laboratory data in the context of their chronic comorbidities is felt to place them at high risk for further clinical deterioration. Furthermore, it is not anticipated that the patient will be medically stable for discharge from the hospital  within 2 midnights of admission.  ? ?* I certify that at the point of admission it is my clinical judgment that the patient will require inpatient hospital care spanning beyond 2 midnights from the point of admission due to high intensity of service, high risk for further deterioration and high frequency of surveillance required.* ? ?Author: ?Collier Bullock, MD ?07/12/2021 1:16 PM ? ?For on call review [email or web address].  ?

## 2021-07-12 NOTE — Assessment & Plan Note (Signed)
Patient presents for evaluation of swelling, redness and pain involving the left great toe with streaking onto the dorsum of the left foot ?She has marked leukocytosis and elevated procalcitonin levels ?Continue Rocephin initiated in the ER ?Follow-up results of blood cultures ?

## 2021-07-12 NOTE — Consult Note (Signed)
CODE SEPSIS - PHARMACY COMMUNICATION ? ?**Broad Spectrum Antibiotics should be administered within 1 hour of Sepsis diagnosis** ? ?Time Code Sepsis Called/Page Received: 1039 ? ?Antibiotics Ordered: Ceftriaxone ? ?Time of 1st antibiotic administration: 1109 ? ?Additional action taken by pharmacy: none ? ?If necessary, Name of Provider/Nurse Contacted: n/a ? ? ? ?Bettey Costa ,PharmD ?Clinical Pharmacist  ?07/12/2021  11:19 AM  ?

## 2021-07-12 NOTE — ED Provider Notes (Signed)
? ?[facility] ?Provider Note ? ? ? Event Date/Time  ? First MD Initiated Contact with Patient 07/12/21 1002   ?  (approximate) ? ? ?History  ? ?Back Pain and Foot Pain ? ? ?HPI ? ?Christy Ray is a 66 y.o. female with a history of dermatomyositis on CellCept who presents with complaints of left great toe redness, foot swelling and pain which is worsened over the last 48 hours.  She denies fevers.  Denies injury to the area.  She also complains of some back pain which she attributes to muscle spasms ?  ? ? ?Physical Exam  ? ?Triage Vital Signs: ?ED Triage Vitals  ?Enc Vitals Group  ?   BP 07/12/21 0841 (!) 135/91  ?   Pulse Rate 07/12/21 0841 (!) 111  ?   Resp 07/12/21 0841 16  ?   Temp 07/12/21 0843 97.6 ?F (36.4 ?C)  ?   Temp Source 07/12/21 0843 Oral  ?   SpO2 07/12/21 0841 98 %  ?   Weight 07/12/21 0843 60.3 kg (133 lb)  ?   Height 07/12/21 0843 1.651 m (5\' 5" )  ?   Head Circumference --   ?   Peak Flow --   ?   Pain Score 07/12/21 0842 10  ?   Pain Loc --   ?   Pain Edu? --   ?   Excl. in [place]? --   ? ? ?Most recent vital signs: ?Vitals:  ? 07/12/21 1205 07/12/21 1250  ?BP: (!) 147/84 (!) 146/97  ?Pulse: 89 93  ?Resp: 14 16  ?Temp:  97.8 ?F (36.6 ?C)  ?SpO2: 99% 94%  ? ? ? ?General: Awake, no distress.  ?CV:  Good peripheral perfusion.  ?Resp:  Normal effort.  ?Abd:  No distention.  ?Other:  Left foot: Mild erythema along the medial dorsal aspect with erythema extending up the foot, mild swelling, no clear skin break ? ? ?ED Results / Procedures / Treatments  ? ?Labs ?(all labs ordered are listed, but only abnormal results are displayed) ?Labs Reviewed  ?CBC WITH DIFFERENTIAL/PLATELET - Abnormal; Notable for the following components:  ?    Result Value  ? WBC 23.6 (*)   ? RDW 16.1 (*)   ? Neutro Abs 19.8 (*)   ? Monocytes Absolute 1.1 (*)   ? Abs Immature Granulocytes 0.98 (*)   ? All other components within normal limits  ?COMPREHENSIVE METABOLIC PANEL - Abnormal; Notable for the  following components:  ? Potassium 2.6 (*)   ? Glucose, Bld 117 (*)   ? BUN 28 (*)   ? Albumin 3.0 (*)   ? Alkaline Phosphatase 384 (*)   ? Total Bilirubin 1.5 (*)   ? All other components within normal limits  ?SEDIMENTATION RATE - Abnormal; Notable for the following components:  ? Sed Rate 68 (*)   ? All other components within normal limits  ?CULTURE, BLOOD (SINGLE)  ?PROCALCITONIN  ?URIC ACID  ?LACTIC ACID, PLASMA  ?LACTIC ACID, PLASMA  ?MAGNESIUM  ?URINALYSIS, ROUTINE W REFLEX MICROSCOPIC  ?HIV ANTIBODY (ROUTINE TESTING W REFLEX)  ? ? ? ?EKG ? ? ? ? ?RADIOLOGY ?Foot x-ray viewed by me, no acute abnormality ? ? ? ?PROCEDURES: ? ?Critical Care performed: yes ? ?CRITICAL CARE ?Performed by: Lavonia Drafts ? ? ?Total critical care time: 30 minutes ? ?Critical care time was exclusive of separately billable procedures and treating other patients. ? ?Critical care was necessary to treat or prevent imminent  or life-threatening deterioration. ? ?Critical care was time spent personally by me on the following activities: development of treatment plan with patient and/or surrogate as well as nursing, discussions with consultants, evaluation of patient's response to treatment, examination of patient, obtaining history from patient or surrogate, ordering and performing treatments and interventions, ordering and review of laboratory studies, ordering and review of radiographic studies, pulse oximetry and re-evaluation of patient's condition. ? ? ?Procedures ? ? ?MEDICATIONS ORDERED IN ED: ?Medications  ?cefTRIAXone (ROCEPHIN) 2 g in sodium chloride 0.9 % 100 mL IVPB (0 g Intravenous Stopped 07/12/21 1200)  ?enoxaparin (LOVENOX) injection 40 mg (40 mg Subcutaneous Given 07/12/21 1258)  ?sodium chloride flush (NS) 0.9 % injection 3 mL (3 mLs Intravenous Given 07/12/21 1247)  ?sodium chloride flush (NS) 0.9 % injection 3 mL (has no administration in time range)  ?0.9 %  sodium chloride infusion (has no administration in time range)   ?ketorolac (TORADOL) 15 MG/ML injection 15 mg (15 mg Intravenous Given 07/12/21 1306)  ?ondansetron (ZOFRAN) tablet 4 mg (has no administration in time range)  ?  Or  ?ondansetron (ZOFRAN) injection 4 mg (has no administration in time range)  ?amLODipine (NORVASC) tablet 2.5 mg (has no administration in time range)  ?rosuvastatin (CRESTOR) tablet 10 mg (has no administration in time range)  ?hydrOXYzine (ATARAX) tablet 10 mg (has no administration in time range)  ?folic acid (FOLVITE) tablet 1 mg (has no administration in time range)  ?mycophenolate (CELLCEPT) capsule 1,000 mg (has no administration in time range)  ?predniSONE (DELTASONE) tablet 40 mg (has no administration in time range)  ?potassium chloride SA (KLOR-CON M) CR tablet 40 mEq (has no administration in time range)  ?losartan (COZAAR) tablet 100 mg (has no administration in time range)  ?  And  ?hydrochlorothiazide (HYDRODIURIL) tablet 12.5 mg (has no administration in time range)  ?sodium chloride 0.9 % bolus 1,000 mL (1,000 mLs Intravenous New Bag/Given 07/12/21 1108)  ?predniSONE (DELTASONE) tablet 10 mg (10 mg Oral Given 07/12/21 1316)  ? ? ? ?IMPRESSION / MDM / ASSESSMENT AND PLAN / ED COURSE  ?I reviewed the triage vital signs and the nursing notes. ? ?Patient presents with left foot pain as above with erythema to the area.  She is immunocompromise ? ?Differential includes cellulitis, traumatic injury, sepsis ? ?Lab work is notable for an elevated white blood cell count of 23.6, she also has hypokalemia on lab work ? ?Given elevated white blood cell count, tachycardia, and exam consistent with cellulitis consistent with sepsis ? ?We will treat her aggressively with IV antibiotics given her immunocompromise status ? ?I have discussed with the hospitalist for admission ? ? ? ? ? ?  ? ? ?FINAL CLINICAL IMPRESSION(S) / ED DIAGNOSES  ? ?Final diagnoses:  ?Sepsis, due to unspecified organism, unspecified whether acute organ dysfunction present [facility])   ? ? ? ?Rx / DC Orders  ? ?ED Discharge Orders   ? ? None  ? ?  ? ? ? ?Note:  This document was prepared using Dragon voice recognition software and may include unintentional dictation errors. ?  ?Lavonia Drafts, MD ?07/12/21 1416 ? ?

## 2021-07-12 NOTE — ED Provider Triage Note (Signed)
Emergency Medicine Provider Triage Evaluation Note ? ?Christy Ray , a 66 y.o. female  was evaluated in triage.  Pt complains of left foot pain, atraumatic.  Left MCP swelling, erythema.  No history of gout.  She is on CellCept for dermatomyositis. ? ?Review of Systems  ?Positive:  ?Negative:  ? ?Physical Exam  ?BP (!) 135/91 (BP Location: Left Arm)   Pulse (!) 111   Temp 97.6 ?F (36.4 ?C) (Oral)   Resp 16   Ht 5\' 5"  (1.651 m)   Wt 60.3 kg   SpO2 98%   BMI 22.13 kg/m?  ?Gen:   Awake, no distress   ?Resp:  Normal effort  ?MSK:   Moves extremities without difficulty  ?Other:   ? ?Medical Decision Making  ?Medically screening exam initiated at 8:48 AM.  Appropriate orders placed.  LUARA FAYE was informed that the remainder of the evaluation will be completed by another provider, this initial triage assessment does not replace that evaluation, and the importance of remaining in the ED until their evaluation is complete. ? ? ?  ?Benay Pillow, MD ?07/12/21 [phone] ? ?

## 2021-07-12 NOTE — Progress Notes (Signed)
Pt vomiting at this time. Emesis bag provided. ?

## 2021-07-13 ENCOUNTER — Encounter: Payer: Self-pay | Admitting: Internal Medicine

## 2021-07-13 ENCOUNTER — Inpatient Hospital Stay: Payer: Medicare Other

## 2021-07-13 DIAGNOSIS — E876 Hypokalemia: Secondary | ICD-10-CM | POA: Diagnosis not present

## 2021-07-13 DIAGNOSIS — M3390 Dermatopolymyositis, unspecified, organ involvement unspecified: Secondary | ICD-10-CM

## 2021-07-13 DIAGNOSIS — B9562 Methicillin resistant Staphylococcus aureus infection as the cause of diseases classified elsewhere: Secondary | ICD-10-CM | POA: Diagnosis not present

## 2021-07-13 DIAGNOSIS — R7881 Bacteremia: Secondary | ICD-10-CM

## 2021-07-13 DIAGNOSIS — B9561 Methicillin susceptible Staphylococcus aureus infection as the cause of diseases classified elsewhere: Secondary | ICD-10-CM

## 2021-07-13 DIAGNOSIS — L03116 Cellulitis of left lower limb: Secondary | ICD-10-CM | POA: Diagnosis not present

## 2021-07-13 LAB — BLOOD CULTURE ID PANEL (REFLEXED) - BCID2

## 2021-07-13 LAB — CBC
HCT: 33.6 % — ABNORMAL LOW (ref 36.0–46.0)
Hemoglobin: 11.4 g/dL — ABNORMAL LOW (ref 12.0–15.0)
MCH: 28.1 pg (ref 26.0–34.0)
MCHC: 33.9 g/dL (ref 30.0–36.0)
MCV: 83 fL (ref 80.0–100.0)
Platelets: 166 10*3/uL (ref 150–400)
RBC: 4.05 MIL/uL (ref 3.87–5.11)
RDW: 15.9 % — ABNORMAL HIGH (ref 11.5–15.5)
WBC: 26.2 10*3/uL — ABNORMAL HIGH (ref 4.0–10.5)
nRBC: 0 % (ref 0.0–0.2)

## 2021-07-13 LAB — BASIC METABOLIC PANEL
Anion gap: 10 (ref 5–15)
BUN: 19 mg/dL (ref 8–23)
CO2: 26 mmol/L (ref 22–32)
Calcium: 8.7 mg/dL — ABNORMAL LOW (ref 8.9–10.3)
Chloride: 102 mmol/L (ref 98–111)
Creatinine, Ser: 0.52 mg/dL (ref 0.44–1.00)
GFR, Estimated: >60 mL/min (ref >60)
Glucose, Bld: 110 mg/dL — ABNORMAL HIGH (ref 70–99)
Potassium: 3.3 mmol/L — ABNORMAL LOW (ref 3.5–5.1)
Sodium: 138 mmol/L (ref 135–145)

## 2021-07-13 LAB — PROCALCITONIN: Procalcitonin: 0.65 ng/mL

## 2021-07-13 IMAGING — MR MR FOOT*L* W/O CM
7 series · 40 of 40 positions shown · non-contrast
Comparison: X-ray [DATE]

CLINICAL DATA: Foot swelling, diabetic, osteomyelitis suspected,
xray done

EXAM:
MRI OF THE LEFT FOOT WITHOUT CONTRAST
TECHNIQUE: Multiplanar, multisequence MR imaging of the left forefoot was
performed. No intravenous contrast was administered.

[Series 3: T1 · coronal · left · 3.0mm · 0.38mm/px · 7 of 45 slices shown (1 of 2)]
[im 1/45]
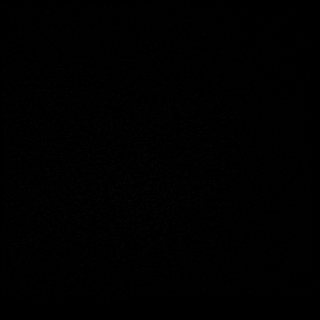
[im 8/45]
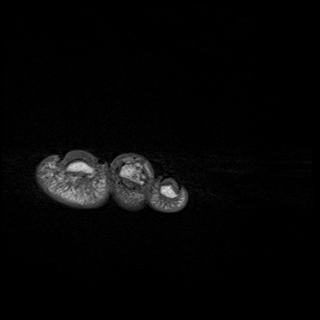
[im 15/45]
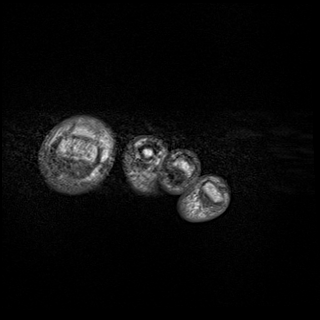
[im 23/45]
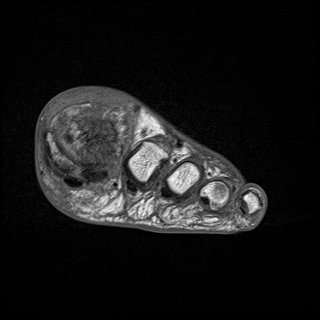
[im 30/45]
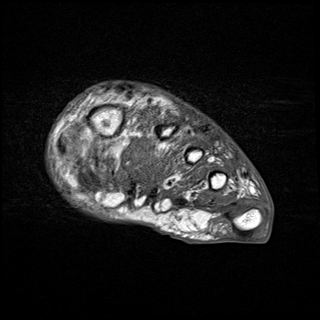
[im 37/45]
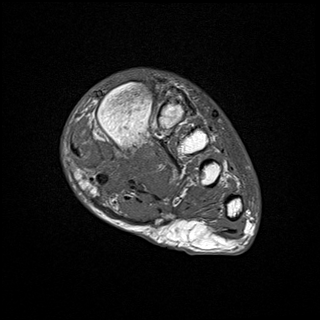
[im 45/45]
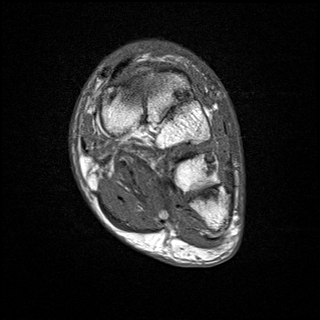

[Series 4: T2 · coronal · left · 3.0mm · 0.50mm/px · 8 of 43 slices shown (1 of 4)]
[im 1/43]
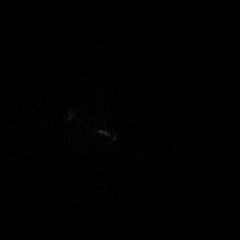
[im 7/43]
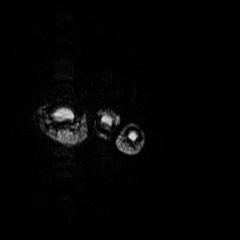
[im 13/43]
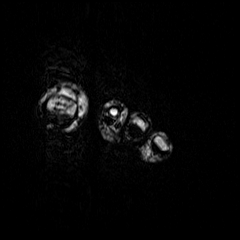
[im 19/43]
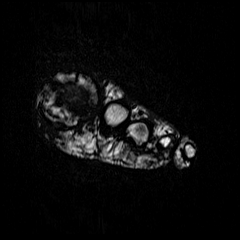
[im 25/43]
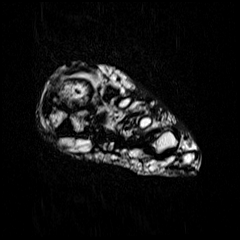
[im 31/43]
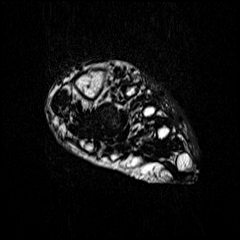
[im 37/43]
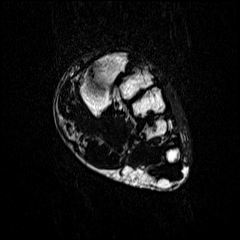
[im 43/43]
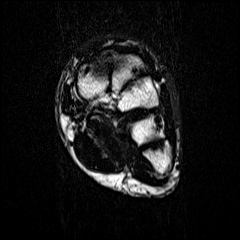

[Series 5: T2 · coronal · left · 3.0mm · 0.50mm/px · 8 of 44 slices shown (2 of 4)]
[im 1/44]
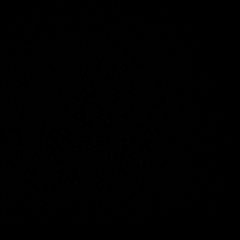
[im 7/44]
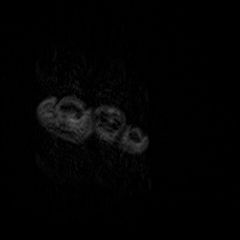
[im 13/44]
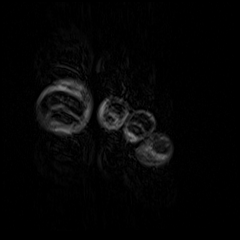
[im 19/44]
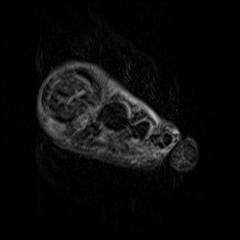
[im 25/44]
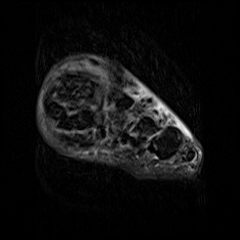
[im 31/44]
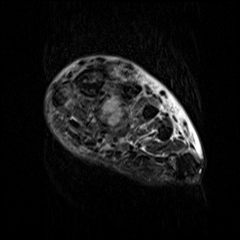
[im 37/44]
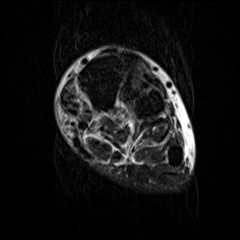
[im 44/44]
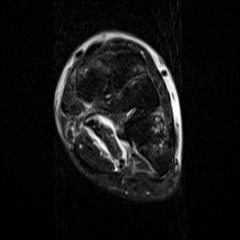

[Series 6: T1 · axial · left · 3.0mm · 0.70mm/px · z∈[-107,-36]mm · 4 of 20 slices shown (2 of 2)]
[im 1/20]
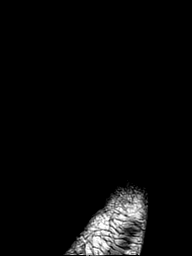
[im 7/20]
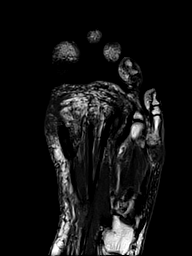
[im 13/20]
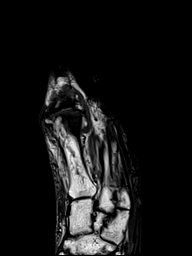
[im 20/20]
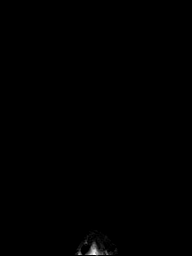

[Series 7: T2 · axial · left · 3.0mm · 0.70mm/px · z∈[-107,-36]mm · 4 of 20 slices shown (3 of 4)]
[im 1/20]
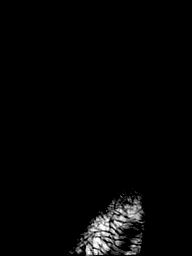
[im 7/20]
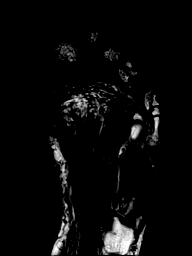
[im 13/20]
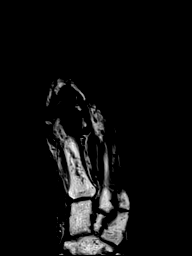
[im 20/20]
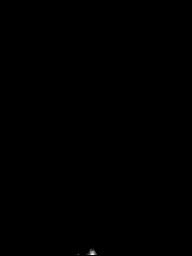

[Series 8: T2 · axial · left · 3.0mm · 0.70mm/px · z∈[-107,-36]mm · 4 of 20 slices shown (4 of 4)]
[im 1/20]
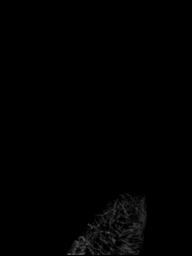
[im 7/20]
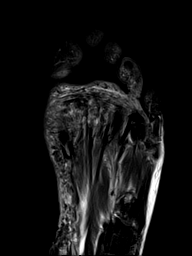
[im 13/20]
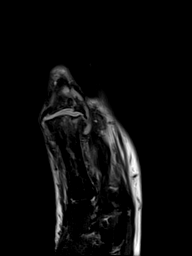
[im 20/20]
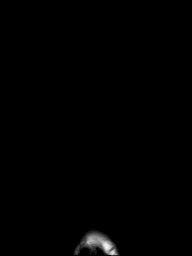

[Series 9: STIR · sagittal · left · 3.0mm · 0.62mm/px · 5 of 30 slices shown]
[im 1/30]
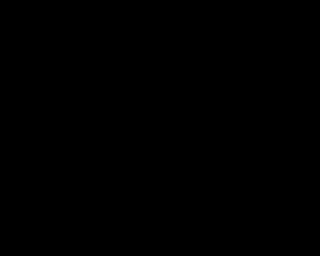
[im 8/30]
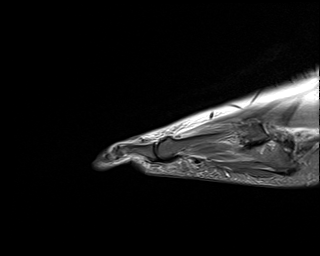
[im 15/30]
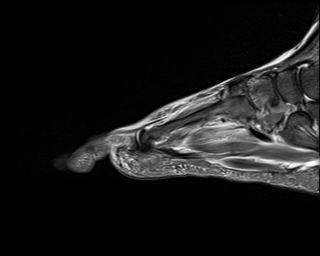
[im 22/30]
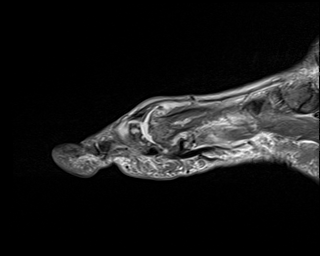
[im 30/30]
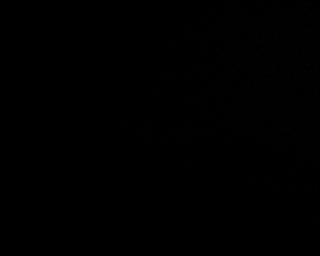

[40 of 40 positions shown; findings below may reference images not displayed]

FINDINGS: Technical Note: Despite efforts by the technologist and patient,
motion artifact is present on today's exam and could not be
eliminated. This reduces exam sensitivity and specificity.

Bones/Joint/Cartilage

Severe arthropathy of the first MTP joint with complete
full-thickness cartilage loss, subchondral sclerosis/cystic change,
and bulky marginal osteophyte formation. Possible fragmentation of
dorsal osteophyte of the base of the great toe proximal phalanx.
Moderate-large first MTP joint effusion, nonspecific. Subchondral
marrow edema on both sides of the joint may be degenerative/reactive
although changes related to infection would have a similar
appearance.

Moderate degenerative changes across the tarsometatarsal joints.
Bone marrow edema within the fourth and fifth metatarsal bases and
to a lesser degree at the second metatarsal proximal diaphysis are
favored to represent a combination of reactive/degenerative changes
and stress-related changes. Moderate naviculocuneiform arthropathy.
No malalignment.

Ligaments

Intact Lisfranc ligament.  First MTP joint capsular thickening.

Muscles and Tendons

Edema-like signal throughout the foot musculature. Focal
intramuscular fluid collection within the adductor hallucis muscle
adjacent to the first metatarsal diaphysis measuring 3.4 x 1.2 x
cm (series 5, image 31; series 9, images 13-15). No significant
tenosynovial fluid collection.

Soft tissues

Generalized subcutaneous edema, most pronounced over the dorsum of
the forefoot. Additional focal fluid collection adjacent to the
medial margin of the first metatarsal neck measuring 1.7 x 0.6 x
cm. No ulceration is seen.
IMPRESSION: 1. Severe arthropathy of the first MTP joint. Moderate-large first
MTP joint effusion, nonspecific and may be degenerative/reactive,
related to an underlying inflammatory arthropathy, or represent
septic arthritis.
2. Marrow edema within the first metatarsal head and great toe
proximal phalanx could also be reactive/degenerative or secondary to
osteomyelitis in the setting of infection.
3. Focal intramuscular fluid collection within the adductor hallucis
muscle adjacent to the first metatarsal diaphysis measuring 3.4 x
1.2 x 1.5 cm. Findings are suspicious for intramuscular abscess.
4. Additional probable abscess along the medial margin of the first
metatarsal neck measuring up to 1.7 cm.
5. Bone marrow edema within the fourth and fifth metatarsal bases
and to a lesser degree at the second metatarsal proximal diaphysis
are favored to represent a combination of reactive/degenerative
changes and stress-related changes.

## 2021-07-13 MED ORDER — VANCOMYCIN HCL 1250 MG/250ML IV SOLN
1250.0000 mg | INTRAVENOUS | Status: DC
  Administered 2021-07-14: 1250 mg via INTRAVENOUS
  Filled 2021-07-13: qty 250

## 2021-07-13 MED ORDER — VANCOMYCIN HCL 1500 MG/300ML IV SOLN
1500.0000 mg | Freq: Once | INTRAVENOUS | Status: AC
  Administered 2021-07-13: 1500 mg via INTRAVENOUS
  Filled 2021-07-13: qty 300

## 2021-07-13 MED ORDER — POTASSIUM CHLORIDE CRYS ER 20 MEQ PO TBCR
40.0000 meq | EXTENDED_RELEASE_TABLET | Freq: Once | ORAL | Status: AC
  Administered 2021-07-13: 40 meq via ORAL
  Filled 2021-07-13: qty 2

## 2021-07-13 NOTE — Progress Notes (Signed)
PHARMACY - PHYSICIAN COMMUNICATION ?CRITICAL VALUE ALERT - BLOOD CULTURE IDENTIFICATION (BCID) ? ?BCID results:  2 of 2 bottles w/ MRSA (mecA/C and MREJ detected).  Pt only on Ceftriaxone for cellulitis. ? ?Name of provider contacted: Morton Amy, NP ? ?Changes to prescribed antibiotics required: Transition pt to Vancomycin ? ?Renda Rolls, PharmD, MBA ?07/13/2021 ?1:53 AM ? ?

## 2021-07-13 NOTE — Progress Notes (Addendum)
? ? ? ?Progress Note  ? ? ?Christy Ray  [id number] DOB: 1956/01/26  DOA: 07/12/2021 ?PCP: Wardell Honour, MD  ? ? ? ? ?Brief Narrative:  ? ? ?Medical records reviewed and are as summarized below: ? ?Christy Ray is a 66 y.o. 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  It started from the left great toe about 4 to 5 days prior to admission but symptoms progressively worsened. ? ?She was admitted to the hospital for left foot cellulitis.  Blood culture showed Staph aureus.  Staph aureus bacteremia. ? ? ? ? ?Assessment/Plan:  ? ?Principal Problem: ?  Cellulitis of left foot ?Active Problems: ?  Gout ?  Hypokalemia ?  Essential hypertension ?  Dermatomyositis ([place]) ?  Nicotine dependence ?  Staphylococcus aureus bacteremia ? ? ? ?Body mass index is 22.13 kg/m?. ? ? ? ?Left foot cellulitis, Staph aureus bacteremia, leukocytosis, immunocompromised state: ?Continue IV vancomycin.  Use analgesics as needed for pain.  Obtain 2D echo for further evaluation of Staph aureus bacteremia.  Monitor CBC and repeat blood culture in 48 hours.  Consulted ID, Dr. Delaine Lame, and podiatrist, Dr. Luana Shu, to assist with management ? ?Hypokalemia: Replace potassium and monitor levels ? ?Dermatomyositis: Continue prednisone.  Hold CellCept. ? ?  ? ?Diet Order   ? ?       ?  Diet 2 gram sodium Room service appropriate? Yes; Fluid consistency: Thin  Diet effective now       ?  ? ?  ?  ? ?  ? ? ? ? ? ? ? ? ?Consultants: ?Infectious disease ? ?Procedures: ?None ? ? ? ?Medications:  ? ? amLODipine  2.5 mg Oral Daily  ? enoxaparin (LOVENOX) injection  40 mg Subcutaneous [id number]  ? folic acid  1 mg Oral Daily  ? losartan  100 mg Oral Daily  ? And  ? hydrochlorothiazide  12.5 mg Oral Daily  ? hydrOXYzine  10 mg Oral QHS  ? potassium chloride  40 mEq Oral Once  ?  predniSONE  40 mg Oral Q breakfast  ? rosuvastatin  10 mg Oral Daily  ? sodium chloride flush  3 mL Intravenous Q12H  ? ?Continuous Infusions: ? sodium chloride 250 mL (07/13/21 0124)  ? [START ON 07/14/2021] vancomycin    ? ? ? ?Anti-infectives (From admission, onward)  ? ? Start     Dose/Rate Route Frequency Ordered Stop  ? 07/14/21 0100  vancomycin (VANCOREADY) IVPB 1250 mg/250 mL       ? 1,250 mg ?166.7 mL/hr over 90 Minutes Intravenous Every 24 hours 07/13/21 0131    ? 07/13/21 0145  vancomycin (VANCOREADY) IVPB 1500 mg/300 mL       ? 1,500 mg ?150 mL/hr over 120 Minutes Intravenous  Once 07/13/21 0057 07/13/21 0325  ? 07/12/21 1045  cefTRIAXone (ROCEPHIN) 2 g in sodium chloride 0.9 % 100 mL IVPB  Status:  Discontinued       ? 2 g ?200 mL/hr over 30 Minutes Intravenous Every 24 hours 07/12/21 1035 07/13/21 0058  ? ?  ? ? ? ? ? ? ? ? ? ?Family Communication/Anticipated D/C date and plan/Code Status  ? ?DVT prophylaxis: enoxaparin (LOVENOX) injection 40 mg Start: 07/12/21 1400 ? ? ?  Code Status: Full Code ? ?Family Communication: Plan discussed with the husband at the bedside ?Disposition Plan:  Plan to discharge home in 2 to 3 days ? ? ?Status is: Inpatient ?Remains inpatient appropriate because: IV antibiotics ? ? ? ? ? ? ?Subjective:  ? ?Interval events noted.  She complains of pain and redness in the left foot.  Swelling of the foot is coming down. ? ?Objective:  ? ? ?Vitals:  ? 07/13/21 0329 07/13/21 0819 07/13/21 1139 07/13/21 1559  ?BP: (!) 157/93 (!) 157/94 120/81 119/86  ?Pulse: 86 98 100 (!) 103  ?Resp: 16 16 16 16   ?Temp: 98.2 ?F (36.8 ?C) (!) 97.4 ?F (36.3 ?C) 98.4 ?F (36.9 ?C) 98.7 ?F (37.1 ?C)  ?TempSrc:      ?SpO2: 99% 96% 99% 98%  ?Weight:      ?Height:      ? ?No data found. ? ? ?Intake/Output Summary (Last 24 hours) at 07/13/2021 1603 ?Last data filed at 07/13/2021 1500 ?Gross per 24 hour  ?Intake 6.88 ml  ?Output 200 ml  ?Net -193.12 ml  ? ?Filed Weights  ? 07/12/21 0843  ?Weight: 60.3 kg   ? ? ?Exam: ? ?GEN: NAD ?SKIN: Rash on her back and chest ?EYES: No pallor or icterus ?ENT: MMM ?CV: RRR ?PULM: CTA B ?ABD: soft, ND, NT, +BS ?CNS: AAO x 3, non focal ?EXT: Mild swelling, tenderness and erythema of dorsal aspect left foot (mainly on the proximal side) ? ? ? ?  ? ? ?Data Reviewed:  ? ?I have personally reviewed following labs and imaging studies: ? ?Labs: ?Labs show the following:  ? ?Basic Metabolic Panel: ?Recent Labs  ?Lab 07/12/21 ?0850 07/13/21 ?[id number]  ?NA 137 138  ?K 2.6* 3.3*  ?CL 99 102  ?CO2 26 26  ?GLUCOSE 117* 110*  ?BUN 28* 19  ?CREATININE 0.69 0.52  ?CALCIUM 9.0 8.7*  ?MG 2.2  --   ? ?GFR ?Estimated Creatinine Clearance: 60.6 mL/min (by C-G formula based on SCr of 0.52 mg/dL). ?Liver Function Tests: ?Recent Labs  ?Lab 07/12/21 ?[id number]  ?AST 20  ?ALT 39  ?ALKPHOS 384*  ?BILITOT 1.5*  ?PROT 6.9  ?ALBUMIN 3.0*  ? ?No results for input(s): LIPASE, AMYLASE in the last 168 hours. ?No results for input(s): AMMONIA in the last 168 hours. ?Coagulation profile ?No results for input(s): INR, PROTIME in the last 168 hours. ? ?CBC: ?Recent Labs  ?Lab 07/12/21 ?0850 07/13/21 ?[id number]  ?WBC 23.6* 26.2*  ?NEUTROABS 19.8*  --   ?HGB 13.6 11.4*  ?HCT 40.1 33.6*  ?MCV 83.0 83.0  ?PLT 177 166  ? ?Cardiac Enzymes: ?No results for input(s): CKTOTAL, CKMB, CKMBINDEX, TROPONINI in the last 168 hours. ?BNP (last 3 results) ?No results for input(s): PROBNP in the last 8760 hours. ?CBG: ?No results for input(s): GLUCAP in the last 168 hours. ?D-Dimer: ?No results for input(s): DDIMER in the last 72 hours. ?Hgb A1c: ?No results for input(s): HGBA1C in the last 72 hours. ?Lipid Profile: ?No results for input(s): CHOL, HDL, LDLCALC, TRIG, CHOLHDL, LDLDIRECT in the last 72 hours. ?Thyroid function studies: ?No results for input(s): TSH, T4TOTAL, T3FREE, THYROIDAB in the last 72 hours. ? ?Invalid input(s): FREET3 ?Anemia work up: ?No results for input(s): VITAMINB12, FOLATE, FERRITIN, TIBC, IRON, RETICCTPCT in the last 72  hours. ?Sepsis Labs: ?Recent Labs  ?Lab 07/12/21 ?0850 07/12/21 ?1118 07/12/21 ?1314 07/13/21 ?0259  ?PROCALCITON 0.72  --   --  0.65  ?WBC 23.6*  --   --  26.2*  ?LATICACIDVEN  --  1.5 1.2  --   ? ? ?Microbiology ?Recent Results (from the  past 240 hour(s))  ?Culture, blood (single)     Status: None (Preliminary result)  ? Collection Time: 07/12/21 10:57 AM  ? Specimen: BLOOD  ?Result Value Ref Range Status  ? Specimen Description   Final  ?  BLOOD LEFT ANTECUBITAL ?Performed at [facility], [address], [place], [place] [address] ?  ? Special Requests   Final  ?  BOTTLES DRAWN AEROBIC AND ANAEROBIC Blood Culture adequate volume ?Performed at [facility], [address], [place], [place] [address] ?  ? Culture  Setup Time   Final  ?  GRAM POSITIVE COCCI ?IN BOTH AEROBIC AND ANAEROBIC BOTTLES ?Organism ID to follow ?CRITICAL RESULT CALLED TO, READ BACK BY AND VERIFIED WITH: ?NATHAN [email or web address]  07/13/21 RH ?  ? Culture GRAM POSITIVE COCCI  Final  ? Report Status PENDING  Incomplete  ?Blood Culture ID Panel (Reflexed)     Status: Abnormal  ? Collection Time: 07/12/21 10:57 AM  ?Result Value Ref Range Status  ? Enterococcus faecalis NOT DETECTED NOT DETECTED Final  ? Enterococcus Faecium NOT DETECTED NOT DETECTED Final  ? Listeria monocytogenes NOT DETECTED NOT DETECTED Final  ? Staphylococcus species DETECTED (A) NOT DETECTED Final  ?  Comment: CRITICAL RESULT CALLED TO, READ BACK BY AND VERIFIED WITH: ?NATHAN [email or web address]@0015  07/13/21 RH ?  ? Staphylococcus aureus (BCID) DETECTED (A) NOT DETECTED Final  ?  Comment: Methicillin (oxacillin)-resistant Staphylococcus aureus (MRSA). MRSA is predictably resistant to beta-lactam antibiotics (except ceftaroline). Preferred therapy is vancomycin unless clinically contraindicated. Patient requires contact precautions if  ?hospitalized. ?CRITICAL RESULT CALLED TO, READ BACK BY AND VERIFIED WITH: ?NATHAN [email or web address]  07/13/21 RH ?  ? Staphylococcus epidermidis NOT  DETECTED NOT DETECTED Final  ? Staphylococcus lugdunensis NOT DETECTED NOT DETECTED Final  ? Streptococcus species NOT DETECTED NOT DETECTED Final  ? Streptococcus agalactiae NOT DETECTED NOT DETECTED Final  ? Strepto

## 2021-07-13 NOTE — Consult Note (Signed)
NAME: Christy Ray  ?DOB: 1955/12/24  ?MRN: [id number]  ?Date/Time: 07/13/2021 6:11 PM ? ?REQUESTING PROVIDER: Dr. Mal Misty ?Subjective:  ?REASON FOR CONSULT: MRSA bacteremia ?? ?Christy Ray is a 66 y.o. with a history of dermatomyositis on CellCept and prednisone current smoker presents with acute onset of left great toe swelling and pain of 4 days duration ?Patient states last week when she was in her yard she developed a blister between the great toe and the second toe.  Then she noted some redness which started to increase with swelling of the right great toe and severe pain. ?She has no history of gout ?She did not have any fever or chills ?In the ED vitals BP 135/91, pulse 111, temperature 97.6 and sats of 98%. ?Labs revealed WBC of 23.6, Hb 13.6 and platelets 177.  Creatinine was 0.69 and potassium was 2.6.  Blood culture sent.  She was started on IV ceftriaxone for possible cellulitis and also started on 40 mg prednisone for gout ?As blood culture was positive for MRSA I am seeing the patient ?Patient also had low back pain lifting her grandson couple of weeks ago ?That has subsided ?But she has some mid back spasm which she thinks is because of the way she is walking due to the pain in the left foot ? ?Past Medical History:  ?Diagnosis Date  ? Dermatomyositis ([place])   ? Polyarthritis   ?Age-related macular degeneration receiving B12 injections with anti-VEGF.  Last one was on 07/08/2021 ?History reviewed. No pertinent surgical history.  ?Social History  ? ?Socioeconomic History  ? Marital status: Married  ?  Spouse name: Not on file  ? Number of children: Not on file  ? Years of education: Not on file  ? Highest education level: Not on file  ?Occupational History  ? Not on file  ?Tobacco Use  ? Smoking status: Every Day  ?  Packs/day: 0.50  ?  Types: Cigarettes  ? Smokeless tobacco: Never  ?Substance and Sexual Activity  ? Alcohol use: No  ? Drug use: Never  ? Sexual activity: Not on file  ?Other Topics  Concern  ? Not on file  ?Social History Narrative  ? Not on file  ? ?Social Determinants of Health  ? ?Financial Resource Strain: Not on file  ?Food Insecurity: Not on file  ?Transportation Needs: Not on file  ?Physical Activity: Not on file  ?Stress: Not on file  ?Social Connections: Not on file  ?Intimate Partner Violence: Not on file  ?  ?Family history mother has macular degeneration ? ?No Known Allergies ?I? ?Current Facility-Administered Medications  ?Medication Dose Route Frequency Provider Last Rate Last Admin  ? 0.9 %  sodium chloride infusion  250 mL Intravenous PRN Agbata, Tochukwu, MD 10 mL/hr at 07/13/21 0124 250 mL at 07/13/21 0124  ? amLODipine (NORVASC) tablet 2.5 mg  2.5 mg Oral Daily Agbata, Tochukwu, MD   2.5 mg at 07/13/21 0857  ? cyclobenzaprine (FLEXERIL) tablet 5 mg  5 mg Oral TID PRN Agbata, Tochukwu, MD   5 mg at 07/13/21 0857  ? enoxaparin (LOVENOX) injection 40 mg  40 mg Subcutaneous Q24H Agbata, Tochukwu, MD   40 mg at 07/13/21 1321  ? folic acid (FOLVITE) tablet 1 mg  1 mg Oral Daily Agbata, Tochukwu, MD   1 mg at 07/13/21 0857  ? losartan (COZAAR) tablet 100 mg  100 mg Oral Daily Darrick Penna, RPH   100 mg at 07/13/21 [id number]  ? And  ?  hydrochlorothiazide (HYDRODIURIL) tablet 12.5 mg  12.5 mg Oral Daily Darrick Penna, RPH   12.5 mg at 07/13/21 [id number]  ? hydrOXYzine (ATARAX) tablet 10 mg  10 mg Oral QHS Agbata, Tochukwu, MD   10 mg at 07/12/21 2226  ? ondansetron (ZOFRAN) tablet 4 mg  4 mg Oral Q6H PRN Agbata, Tochukwu, MD      ? Or  ? ondansetron (ZOFRAN) injection 4 mg  4 mg Intravenous Q6H PRN Agbata, Tochukwu, MD   4 mg at 07/12/21 2028  ? predniSONE (DELTASONE) tablet 40 mg  40 mg Oral Q breakfast Agbata, Tochukwu, MD   40 mg at 07/13/21 0857  ? rosuvastatin (CRESTOR) tablet 10 mg  10 mg Oral Daily Agbata, Tochukwu, MD   10 mg at 07/13/21 0857  ? sodium chloride flush (NS) 0.9 % injection 3 mL  3 mL Intravenous Q12H Agbata, Tochukwu, MD   3 mL at 07/13/21 0900  ? sodium chloride  flush (NS) 0.9 % injection 3 mL  3 mL Intravenous PRN Agbata, Tochukwu, MD      ? [START ON 07/14/2021] vancomycin (VANCOREADY) IVPB 1250 mg/250 mL  1,250 mg Intravenous Q24H Renda Rolls, RPH      ?  ? ?Abtx:  ?Anti-infectives (From admission, onward)  ? ? Start     Dose/Rate Route Frequency Ordered Stop  ? 07/14/21 0100  vancomycin (VANCOREADY) IVPB 1250 mg/250 mL       ? 1,250 mg ?166.7 mL/hr over 90 Minutes Intravenous Every 24 hours 07/13/21 0131    ? 07/13/21 0145  vancomycin (VANCOREADY) IVPB 1500 mg/300 mL       ? 1,500 mg ?150 mL/hr over 120 Minutes Intravenous  Once 07/13/21 0057 07/13/21 0325  ? 07/12/21 1045  cefTRIAXone (ROCEPHIN) 2 g in sodium chloride 0.9 % 100 mL IVPB  Status:  Discontinued       ? 2 g ?200 mL/hr over 30 Minutes Intravenous Every 24 hours 07/12/21 1035 07/13/21 0058  ? ?  ? ? ?REVIEW OF SYSTEMS:  ?Const: negative fever, negative chills, negative weight loss ?Eyes: negative diplopia or visual changes, negative eye pain ?ENT: negative coryza, negative sore throat ?Resp: negative cough, hemoptysis, dyspnea ?Cards: negative for chest pain, palpitations, lower extremity edema ?GU: negative for frequency, dysuria and hematuria ?GI: Negative for abdominal pain, diarrhea, bleeding, constipation ?Skin: Scarred rash  on her back: negative for easy bruising and gum/nose bleeding ?MS: Pain left great toe ?Back pain ?Neurolo:negative for headaches, dizziness, vertigo, memory problems  ?Psych: negative for feelings of anxiety, depression  ?Endocrine: negative for thyroid, diabetes ?Allergy/Immunology- negative for any medication or food allergies ?? ?Pertinent Positives include : ?Objective:  ?VITALS:  ?BP 119/86 (BP Location: Right Arm)   Pulse (!) 103   Temp 98.7 ?F (37.1 ?C)   Resp 16   Ht 5\' 5"  (1.651 m)   Wt 60.3 kg   SpO2 98%   BMI 22.13 kg/m?  ?LDA ?Foley ?Central line ?Other drainage tubes ?PHYSICAL EXAM:  ?General: Alert, cooperative, no distress, appears stated age.  ?Head:  Normocephalic, without obvious abnormality, atraumatic. ?Eyes: Conjunctivae clear, anicteric sclerae. Pupils are equal ?ENT Nares normal. No drainage or sinus tenderness. ?Lips, mucosa, and tongue normal. No Thrush ?Neck: Supple, symmetrical, no adenopathy, thyroid: non tender ?no carotid bruit and no JVD. ?Back: No CVA tenderness. ?Lungs: Clear to auscultation bilaterally. No Wheezing or Rhonchi. No rales. ?Heart: Regular rate and rhythm, no murmur, rub or gallop. ?Abdomen: Soft, non-tender,not distended. Bowel sounds normal. No  masses ?Extremities: Left foot great toe swollen, red, painful, tender to touch ? ? ?Skin: No rashes or lesions. Or bruising ?Lymph: Cervical, supraclavicular normal. ?Neurologic: Grossly non-focal ?Pertinent Labs ?Lab Results ?CBC ?   ?Component Value Date/Time  ? WBC 26.2 (H) 07/13/2021 0259  ? RBC 4.05 07/13/2021 0259  ? HGB 11.4 (L) 07/13/2021 0259  ? HCT 33.6 (L) 07/13/2021 0259  ? PLT 166 07/13/2021 0259  ? MCV 83.0 07/13/2021 0259  ? MCH 28.1 07/13/2021 0259  ? MCHC 33.9 07/13/2021 0259  ? RDW 15.9 (H) 07/13/2021 0259  ? LYMPHSABS 1.5 07/12/2021 0850  ? MONOABS 1.1 (H) 07/12/2021 0850  ? EOSABS 0.0 07/12/2021 0850  ? BASOSABS 0.1 07/12/2021 0850  ? ? ? ?  Latest Ref Rng & Units 07/13/2021  ?  2:59 AM 07/12/2021  ?  8:50 AM  ?CMP  ?Glucose 70 - 99 mg/dL 110   117    ?BUN 8 - 23 mg/dL 19   28    ?Creatinine 0.44 - 1.00 mg/dL 0.52   0.69    ?Sodium 135 - 145 mmol/L 138   137    ?Potassium 3.5 - 5.1 mmol/L 3.3   2.6    ?Chloride 98 - 111 mmol/L 102   99    ?CO2 22 - 32 mmol/L 26   26    ?Calcium 8.9 - 10.3 mg/dL 8.7   9.0    ?Total Protein 6.5 - 8.1 g/dL  6.9    ?Total Bilirubin 0.3 - 1.2 mg/dL  1.5    ?Alkaline Phos 38 - 126 U/L  384    ?AST 15 - 41 U/L  20    ?ALT 0 - 44 U/L  39    ? ? ? ? ?Microbiology: ?Recent Results (from the past 240 hour(s))  ?Culture, blood (single)     Status: None (Preliminary result)  ? Collection Time: 07/12/21 10:57 AM  ? Specimen: BLOOD  ?Result Value Ref  Range Status  ? Specimen Description   Final  ?  BLOOD LEFT ANTECUBITAL ?Performed at [facility], [address], [place], [place] [address] ?  ? Special Requests   Final  ?  BOTTLES DRAWN AEROBIC AND ANA

## 2021-07-13 NOTE — Progress Notes (Signed)
Pharmacy Antibiotic Note ? ?Christy Ray is a 66 y.o. female admitted on 07/12/2021 with MRSA bacteremia.  Pharmacy has been consulted for Vancomycin dosing. ? ?Plan: ?Pt given initial dose of 1500 mg. ?Vancomycin 1250 mg IV Q 24 hrs.  ?Goal AUC 400-550. ?Expected AUC: 459.3 (526.7) ?SCr used: 0.69 (0.8) ? ?Pharmacy will continue to follow and will adjust abx dosing when warranted. ? ? ?Height: 5\' 5"  (165.1 cm) ?Weight: 60.3 kg (133 lb) ?IBW/kg (Calculated) : 57 ? ?Temp (24hrs), Avg:97.9 ?F (36.6 ?C), Min:97.6 ?F (36.4 ?C), Max:98.3 ?F (36.8 ?C) ? ?Recent Labs  ?Lab 07/12/21 ?0850 07/12/21 ?1118 07/12/21 ?1314  ?WBC 23.6*  --   --   ?CREATININE 0.69  --   --   ?LATICACIDVEN  --  1.5 1.2  ?  ?Estimated Creatinine Clearance: 60.6 mL/min (by C-G formula based on SCr of 0.69 mg/dL).   ? ?No Known Allergies ? ?Antimicrobials this admission: ?5/15 Ceftriaxone >> x 1 dose ?5/16 Vancomycin >>  ? ?Microbiology results: ?5/15 BCx: 2 of 2 bottles w/ MRSA (mecA/C and MREJ detected) ? ?Thank you for allowing pharmacy to be a part of this patient?s care. ? ?6/15, PharmD, MBA ?07/13/2021 ?12:59 AM ? ? ?

## 2021-07-14 ENCOUNTER — Inpatient Hospital Stay: Payer: Medicare Other

## 2021-07-14 ENCOUNTER — Inpatient Hospital Stay
Admit: 2021-07-14 | Discharge: 2021-07-14 | Disposition: A | Discharge status: Home / Self Care | Payer: Medicare Other | Attending: Internal Medicine | Admitting: Internal Medicine

## 2021-07-14 ENCOUNTER — Inpatient Hospital Stay: Payer: Medicare Other | Admitting: Anesthesiology

## 2021-07-14 ENCOUNTER — Encounter: Payer: Self-pay | Admitting: Internal Medicine

## 2021-07-14 ENCOUNTER — Encounter
Admission: EM | Disposition: A | Discharge status: Home Health | Payer: Self-pay | Source: Home / Self Care | Attending: Internal Medicine

## 2021-07-14 DIAGNOSIS — R7881 Bacteremia: Secondary | ICD-10-CM | POA: Diagnosis not present

## 2021-07-14 DIAGNOSIS — M009 Pyogenic arthritis, unspecified: Secondary | ICD-10-CM

## 2021-07-14 DIAGNOSIS — B9561 Methicillin susceptible Staphylococcus aureus infection as the cause of diseases classified elsewhere: Secondary | ICD-10-CM | POA: Diagnosis not present

## 2021-07-14 DIAGNOSIS — L03116 Cellulitis of left lower limb: Secondary | ICD-10-CM | POA: Diagnosis not present

## 2021-07-14 DIAGNOSIS — M3390 Dermatopolymyositis, unspecified, organ involvement unspecified: Secondary | ICD-10-CM | POA: Diagnosis not present

## 2021-07-14 HISTORY — PX: INCISION AND DRAINAGE OF WOUND: SHX1803

## 2021-07-14 HISTORY — PX: OSTECTOMY: SHX6439

## 2021-07-14 LAB — CBC
HCT: 35.1 % — ABNORMAL LOW (ref 36.0–46.0)
Hemoglobin: 12.4 g/dL (ref 12.0–15.0)
MCH: 28.7 pg (ref 26.0–34.0)
MCHC: 35.3 g/dL (ref 30.0–36.0)
MCV: 81.3 fL (ref 80.0–100.0)
Platelets: 175 10*3/uL (ref 150–400)
RBC: 4.32 MIL/uL (ref 3.87–5.11)
RDW: 15.9 % — ABNORMAL HIGH (ref 11.5–15.5)
WBC: 26 10*3/uL — ABNORMAL HIGH (ref 4.0–10.5)
nRBC: 0 % (ref 0.0–0.2)

## 2021-07-14 LAB — ECHOCARDIOGRAM COMPLETE
AR max vel: 2.42 cm2
AV Area VTI: 2.94 cm2
AV Area mean vel: 3.02 cm2
AV Mean grad: 5 mmHg
AV Peak grad: 11 mmHg
Ao pk vel: 1.66 m/s
Area-P 1/2: 6.37 cm2
Height: 65 in
MV VTI: 4.53 cm2
P 1/2 time: 471 msec
S' Lateral: 2.6 cm
Weight: 2128 oz

## 2021-07-14 LAB — MAGNESIUM: Magnesium: 2.2 mg/dL (ref 1.7–2.4)

## 2021-07-14 LAB — PROCALCITONIN: Procalcitonin: 1.26 ng/mL

## 2021-07-14 LAB — POTASSIUM: Potassium: 3.5 mmol/L (ref 3.5–5.1)

## 2021-07-14 LAB — SURGICAL PCR SCREEN
MRSA, PCR: NEGATIVE
Staphylococcus aureus: NEGATIVE

## 2021-07-14 IMAGING — MR MR LUMBAR SPINE WO/W CM
6 of 7 series · 30 of 48 positions shown · IV contrast (gadavist)
Comparison: None Available.

CLINICAL DATA: Back pain and MRSA bacteremia

EXAM:
MRI THORACIC AND LUMBAR SPINE WITHOUT AND WITH CONTRAST
TECHNIQUE: Multiplanar and multiecho pulse sequences of the thoracic and lumbar
spine were obtained without and with intravenous contrast.
CONTRAST:  6mL GADAVIST GADOBUTROL 1 MMOL/ML IV SOLN

[Series 1: T2 · sagittal · 4.0mm · 0.81mm/px · 4 of 17 slices shown (1 of 2)]
[im 1/17]
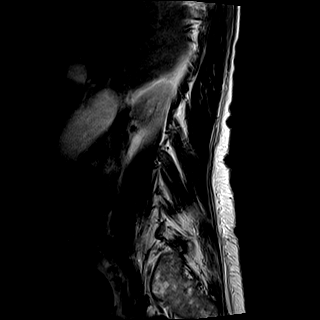
[im 6/17]
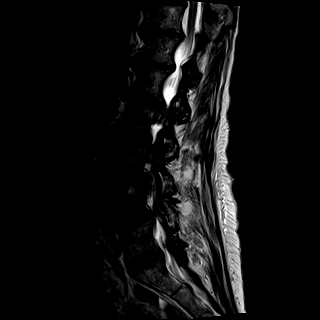
[im 11/17]
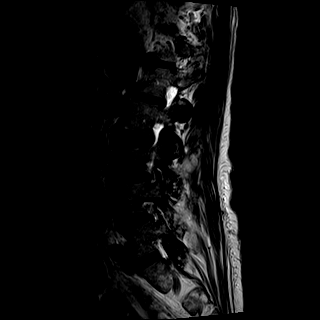
[im 17/17]
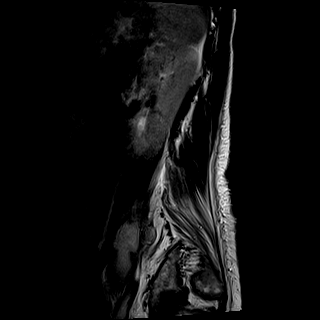

[Series 2: T1 · sagittal · 4.0mm · 0.81mm/px · 4 of 17 slices shown (1 of 2)]
[im 1/17]
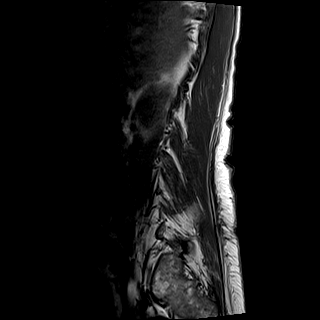
[im 6/17]
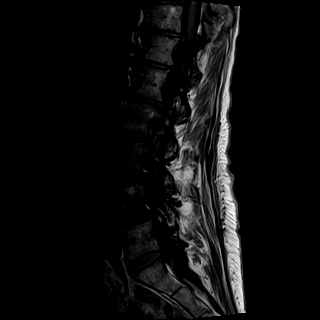
[im 11/17]
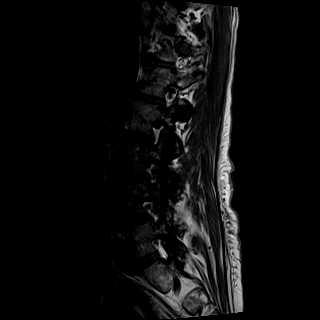
[im 17/17]
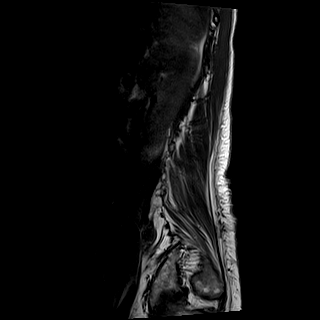

[Series 3: STIR · sagittal · 4.0mm · 0.41mm/px · 1 of 17 slices shown]
[im 1/17]
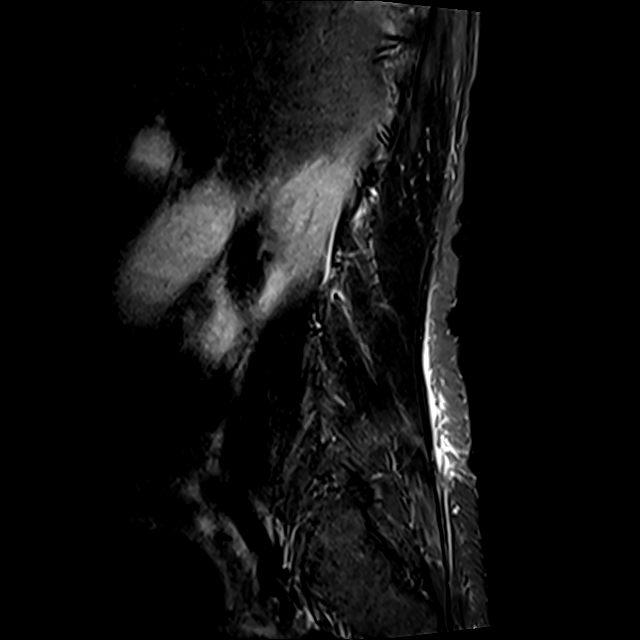

[Series 4: T2 · axial · 4.0mm · 0.78mm/px · z∈[-387,-178]mm · 8 of 36 slices shown (2 of 2)]
[im 1/36]
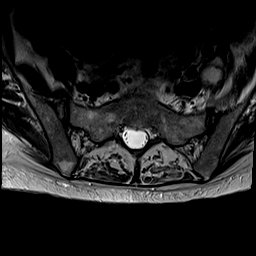
[im 4/36]
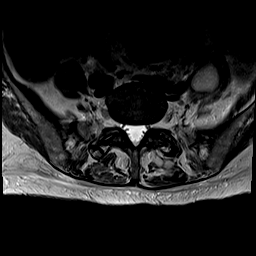
[im 12/36]
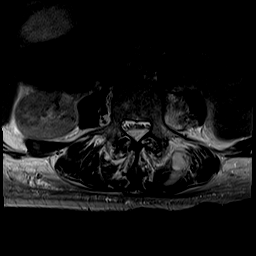
[im 16/36]
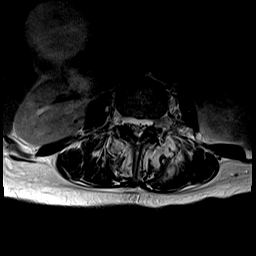
[im 20/36]
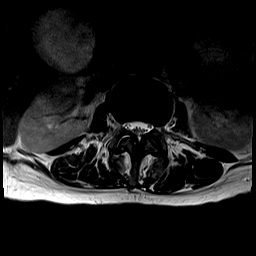
[im 24/36]
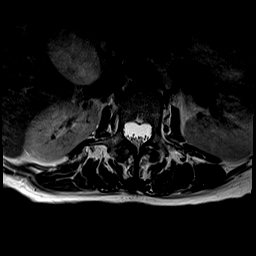
[im 32/36]
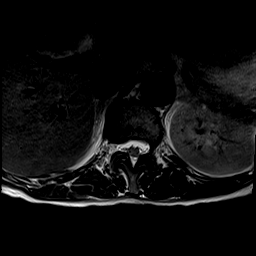
[im 36/36]
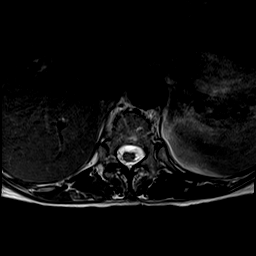

[Series 5: T1 · axial · 4.0mm · 0.39mm/px · z∈[-387,-178]mm · 8 of 36 slices shown (2 of 2)]
[im 1/36]
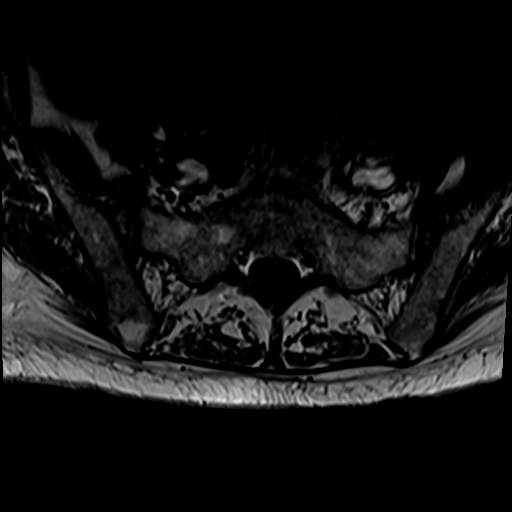
[im 4/36]
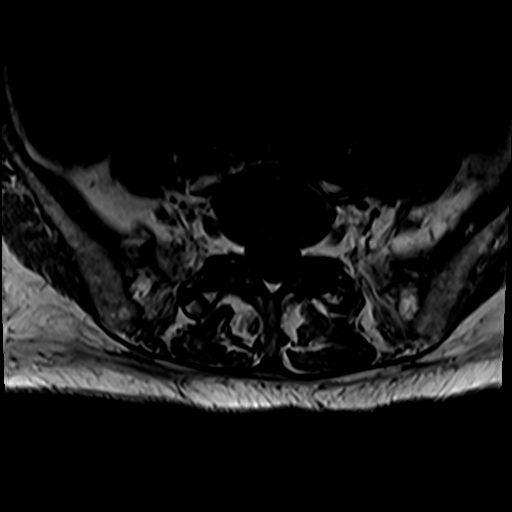
[im 12/36]
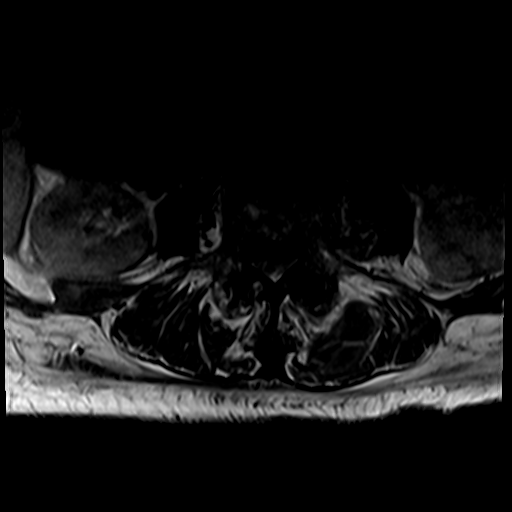
[im 16/36]
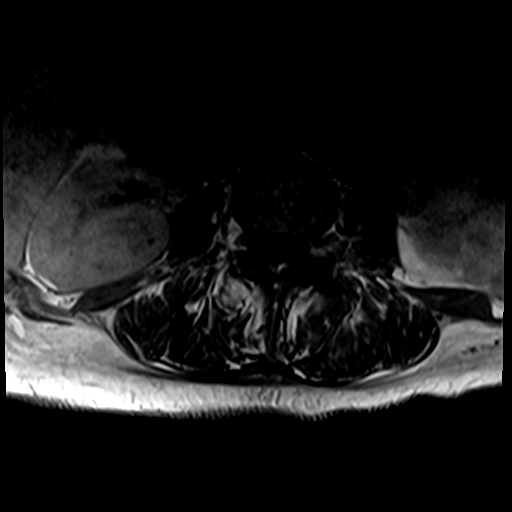
[im 20/36]
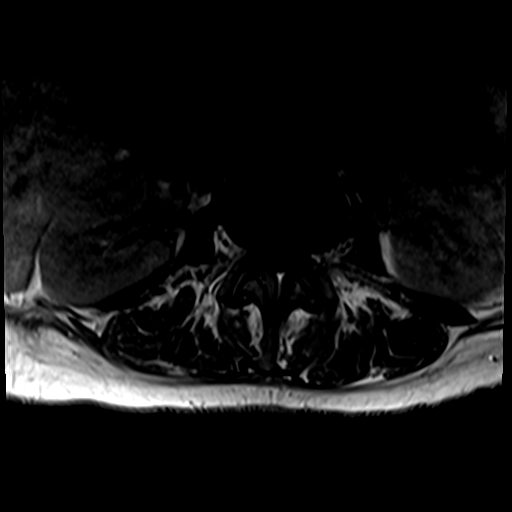
[im 24/36]
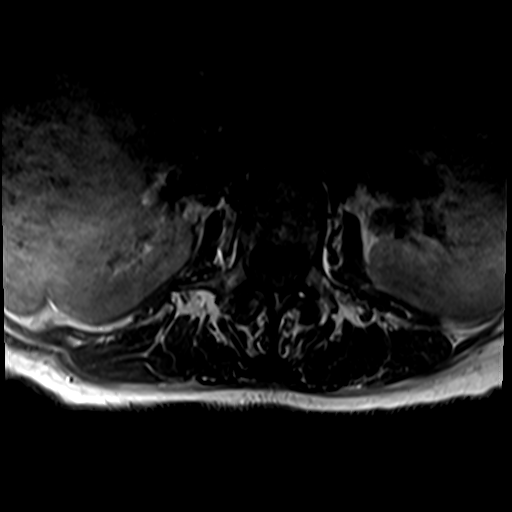
[im 32/36]
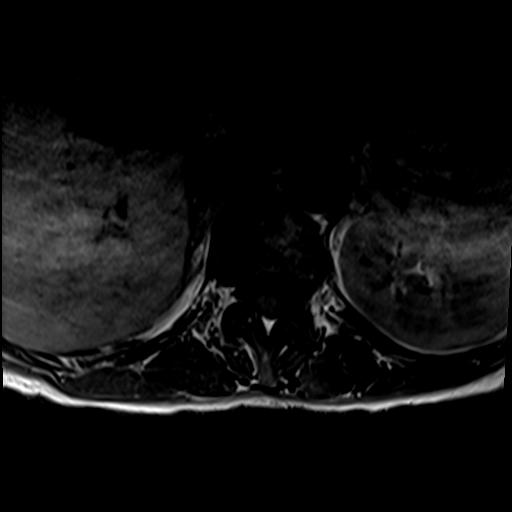
[im 36/36]
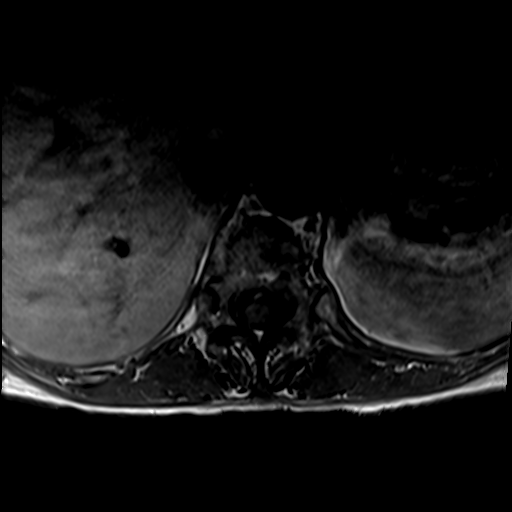

[Series 6: T1 fat-sat post-contrast · sagittal · 4.0mm · 0.81mm/px · 5 of 17 slices shown]
[im 1/17]
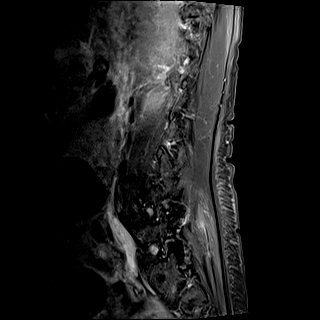
[im 5/17]
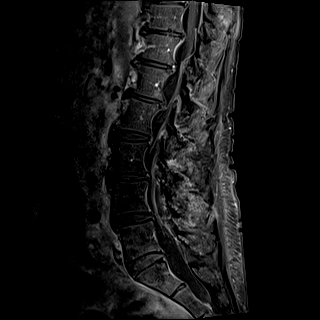
[im 9/17]
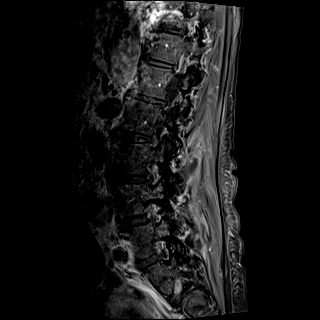
[im 13/17]
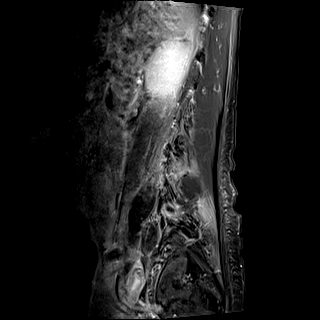
[im 17/17]
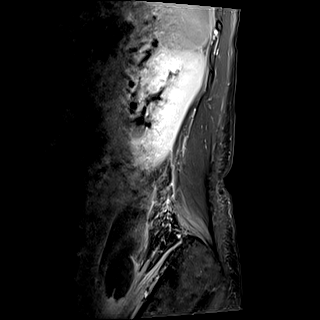

[30 of 48 positions shown; findings below may reference images not displayed]

FINDINGS: MRI THORACIC SPINE FINDINGS

Alignment:  Normal

Vertebrae: No fracture, evidence of discitis, or bone lesion.

Cord: Spinal cord is normal. However, there is a right is eccentric
and dorsal epidural collection extending from T7-T12 with peripheral
contrast enhancement. This causes mild attenuation of the thecal sac
without spinal cord compression. There is a smaller component of the
collection at the left ventral aspect of the T11 level.

Paraspinal and other soft tissues: Negative

Disc levels:

No discogenic spinal canal stenosis.

MRI LUMBAR SPINE FINDINGS

Segmentation:  Standard.

Alignment:  Grade 1 anterolisthesis at L3-4

Vertebrae: Mild edema at the inferior endplate of L1 is likely
degenerative.

Conus medullaris: The conus medullaris terminates at the L1-2 disc
level. There is mild abnormal contrast enhancement of the cauda
equina. Within the dorsal epidural space at L3-5, there is a fluid
collection measuring up to 7 mm in thickness. This severely crowds
the cauda equina nerve roots in the spinal canal.

Paraspinal and other soft tissues: Left psoas abscess measures 1.6 x
8.0 cm. There are small paraspinal abscesses on the left at the L4
level.

Disc levels:

T12-L1: Small disc bulge without spinal canal stenosis.

L1-2: Small disc bulge with endplate spurring.  No stenosis.

L2-3: Normal.

L3-4: Moderate facet hypertrophy and small disc bulge with moderate
spinal canal stenosis.

L4-5: Severe facet arthrosis and small disc bulge with moderate
spinal canal stenosis. No neural foraminal stenosis.

L5-S1: Severe facet arthrosis without spinal canal or neural
foraminal stenosis.
IMPRESSION: 1. Multifocal epidural abscess occluding T7-12 and L3-5. There is
moderate attenuation of the thecal sac.
2. Left psoas abscess and small left posterior paraspinal muscle
abscess.

## 2021-07-14 IMAGING — DX DG FOOT COMPLETE 3+V*L*
3 series · 3 of 3 positions shown · non-contrast
Comparison: [DATE].

CLINICAL DATA: Postoperative check.

EXAM:
LEFT FOOT - COMPLETE 3+ VIEW

[foot lat]
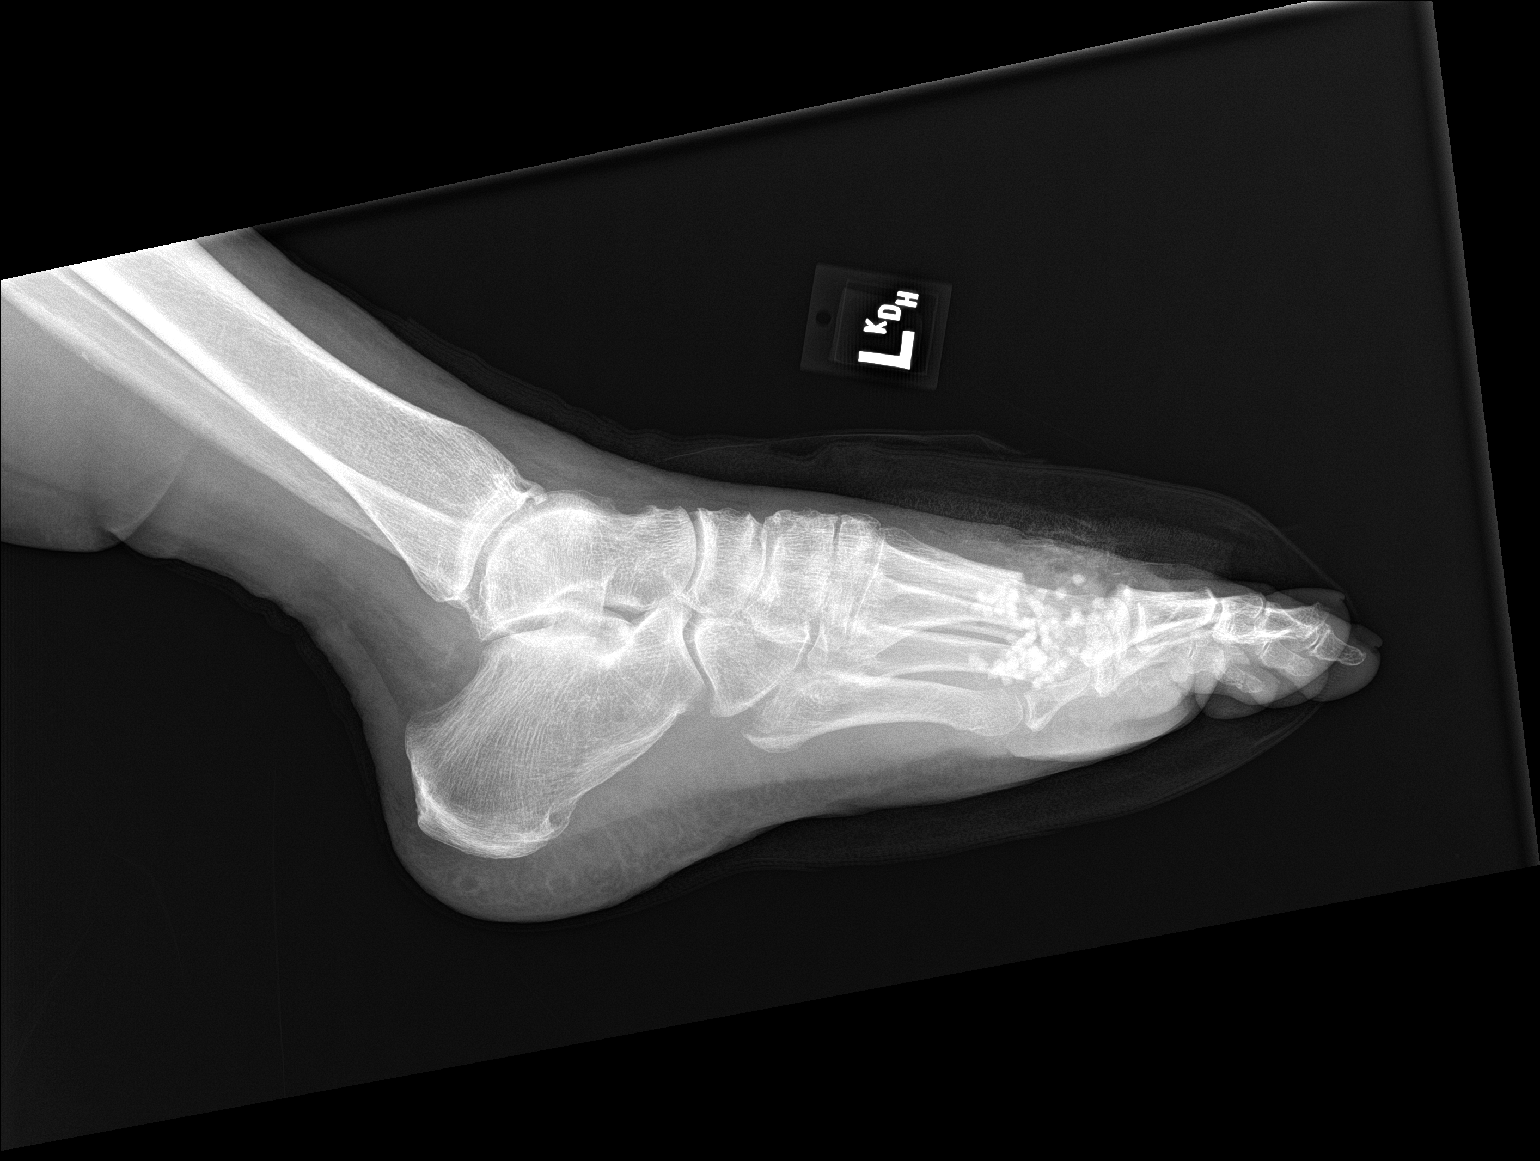

[foot ap]
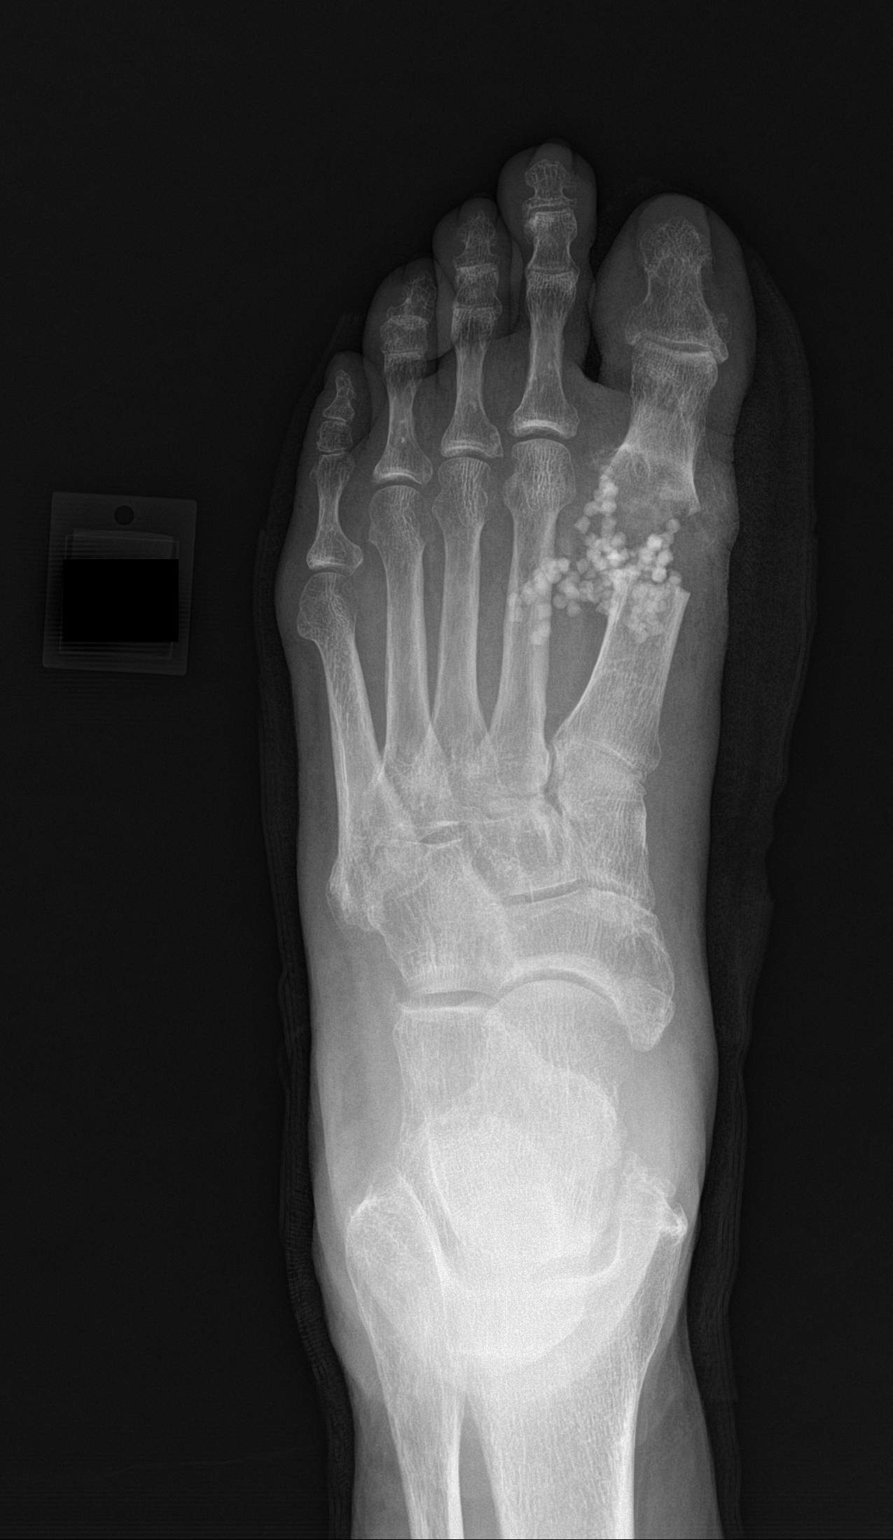

[foot obl]
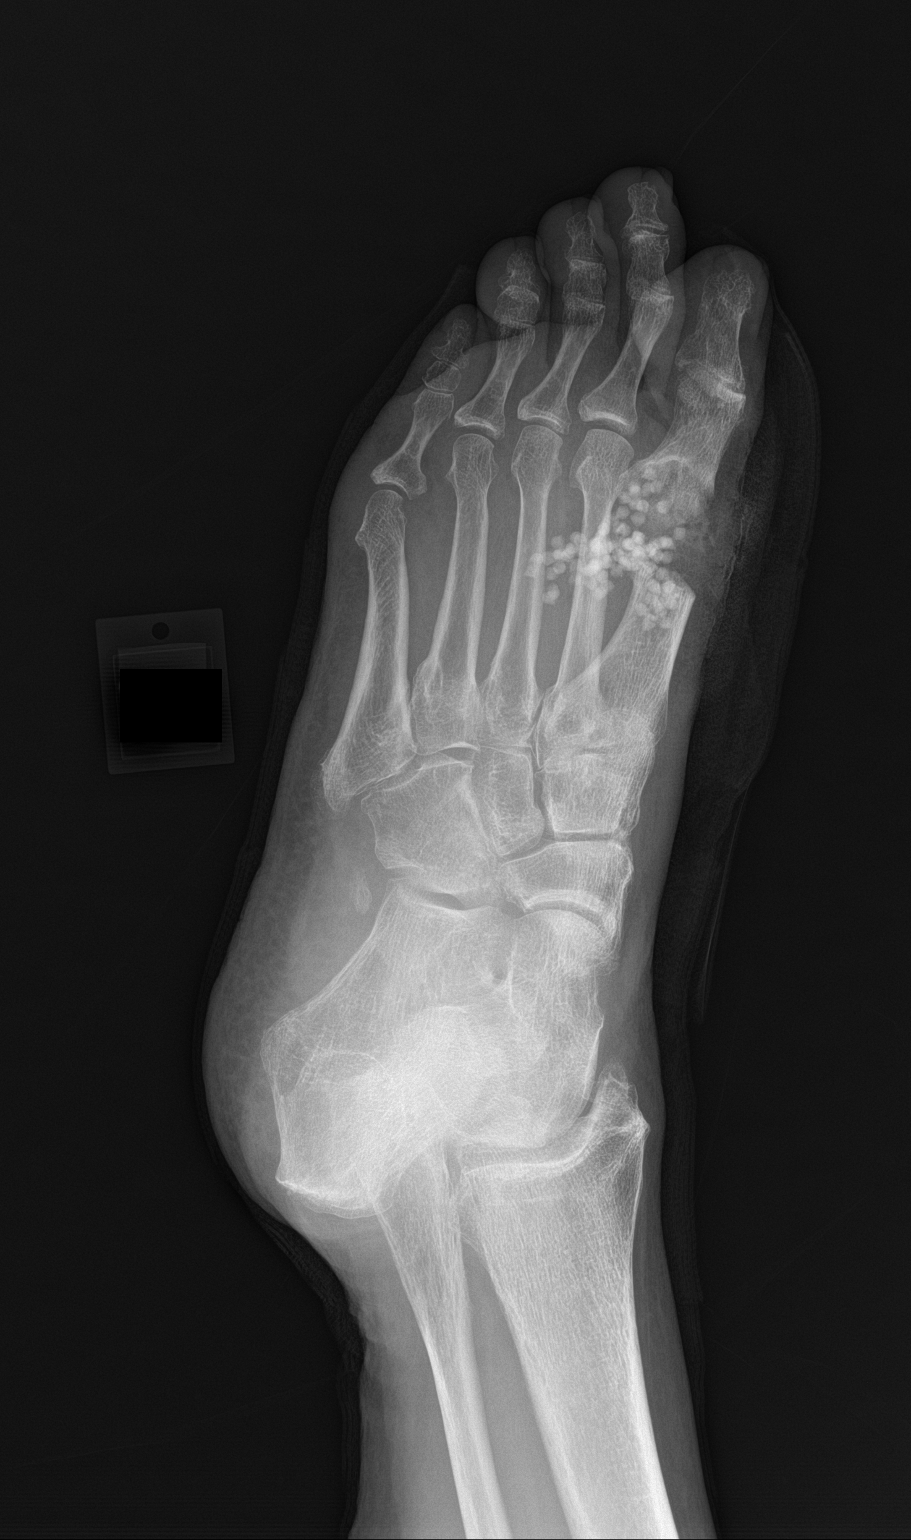

[3 of 3 positions shown; findings below may reference images not displayed]

FINDINGS: Status post osteotomy of distal first metatarsal as well as a
portion of the first proximal phalanx. Surgically placed beads are
noted in the region of the first metatarsophalangeal joint.
IMPRESSION: Postsurgical changes as described above.

## 2021-07-14 IMAGING — MR MR THORACIC SPINE WO/W CM
7 of 9 series · 29 of 48 positions shown · IV contrast (gadavist)
Comparison: None Available.

CLINICAL DATA: Back pain and MRSA bacteremia

EXAM:
MRI THORACIC AND LUMBAR SPINE WITHOUT AND WITH CONTRAST
TECHNIQUE: Multiplanar and multiecho pulse sequences of the thoracic and lumbar
spine were obtained without and with intravenous contrast.
CONTRAST:  6mL GADAVIST GADOBUTROL 1 MMOL/ML IV SOLN

[Series 16: T1 · sagittal · 5.0mm · 1.88mm/px · 1 of 9 slices shown (1 of 3)]
[im 1/9]
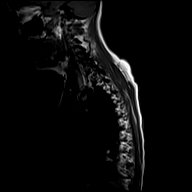

[Series 17: T2 · sagittal · 3.0mm · 1.06mm/px · 3 of 21 slices shown (1 of 2)]
[im 1/21]
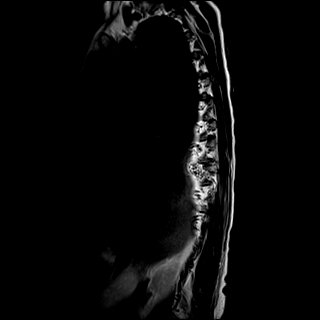
[im 11/21]
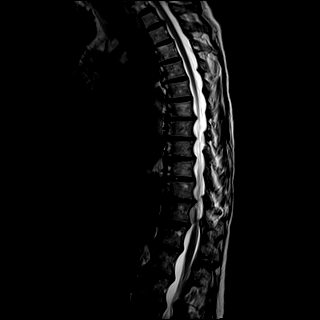
[im 21/21]
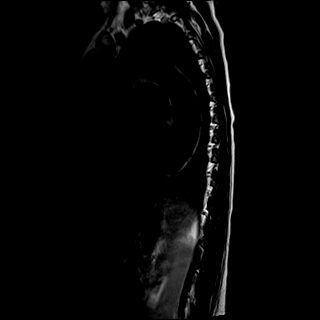

[Series 18: T1 · sagittal · 3.0mm · 1.06mm/px · 4 of 21 slices shown (2 of 3)]
[im 1/21]
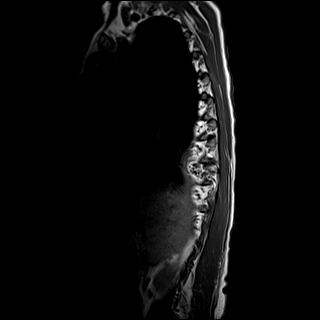
[im 7/21]
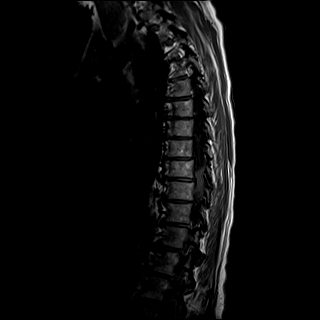
[im 14/21]
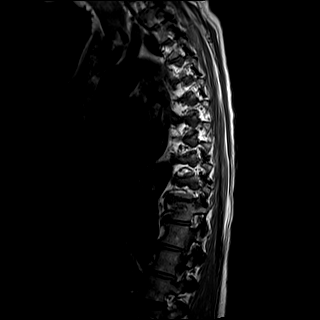
[im 21/21]
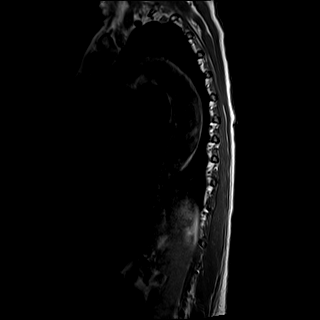

[Series 19: STIR · sagittal · 3.0mm · 0.53mm/px · 1 of 21 slices shown]
[im 1/21]
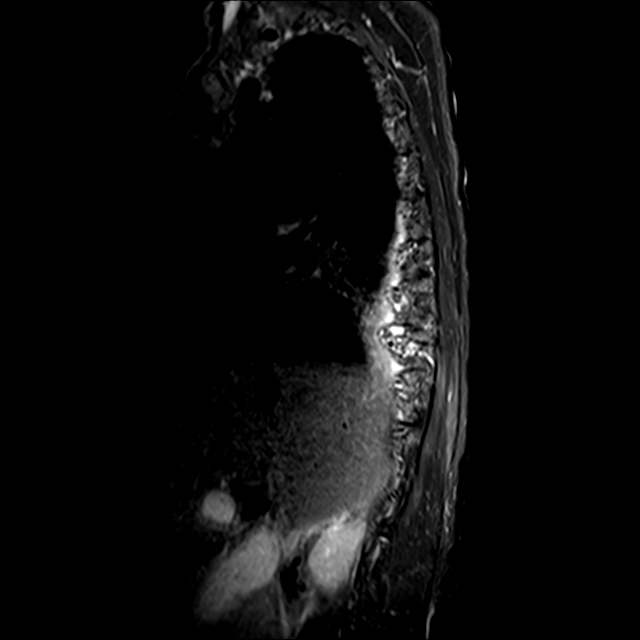

[Series 20: T2 · axial · 4.0mm · 0.59mm/px · z∈[-198,+17]mm · 8 of 39 slices shown (2 of 2)]
[im 1/39]
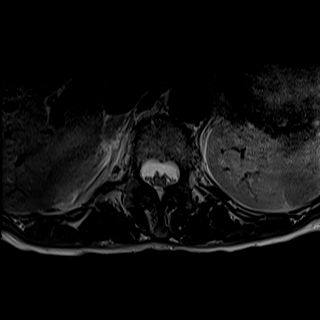
[im 6/39]
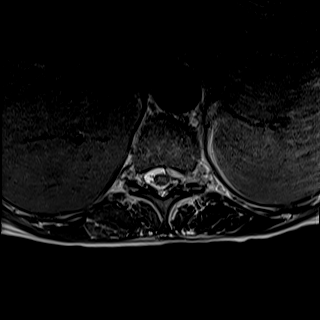
[im 11/39]
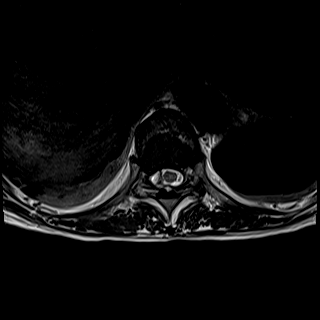
[im 17/39]
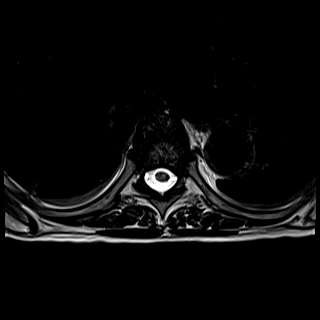
[im 22/39]
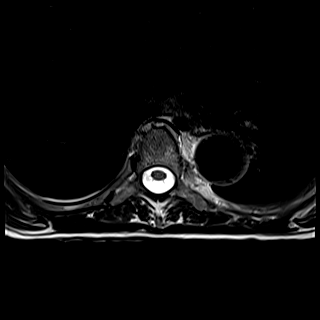
[im 28/39]
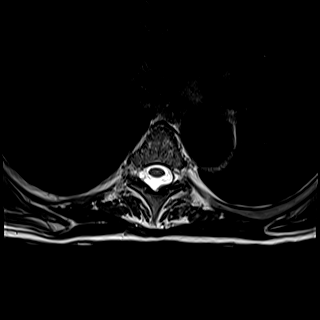
[im 33/39]
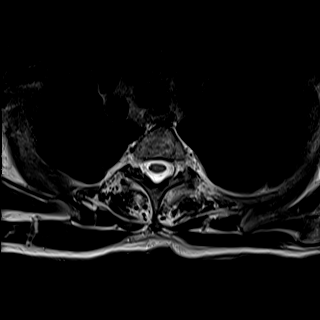
[im 39/39]
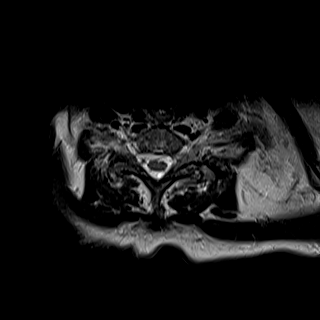

[Series 22: T1 · axial · non-contrast · 4.0mm · 0.37mm/px · z∈[-198,+17]mm · 8 of 39 slices shown (3 of 3)]
[im 1/39]
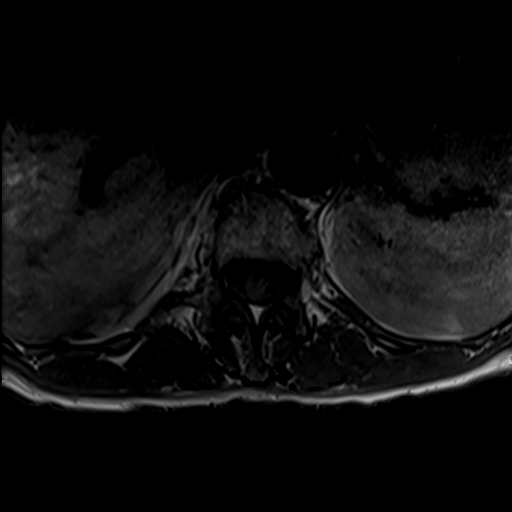
[im 6/39]
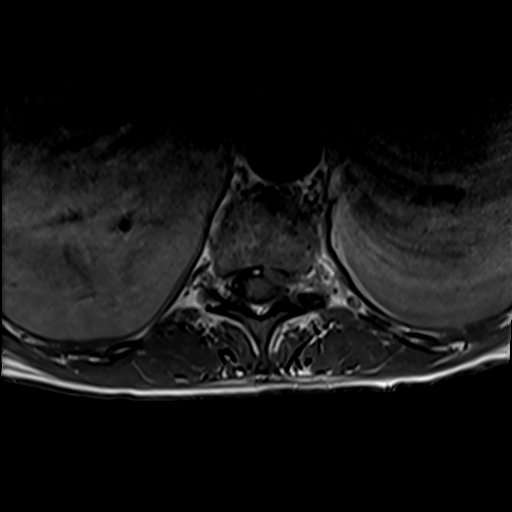
[im 11/39]
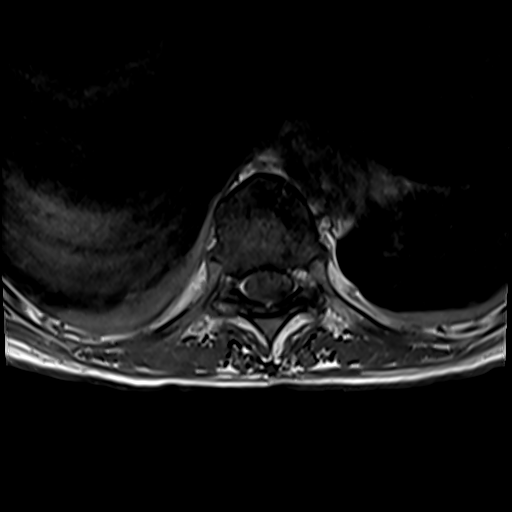
[im 17/39]
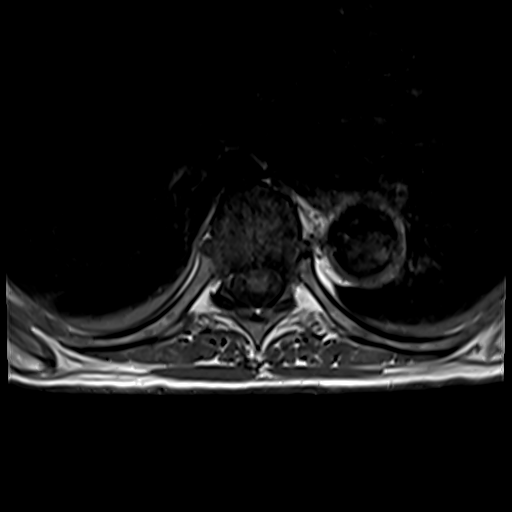
[im 22/39]
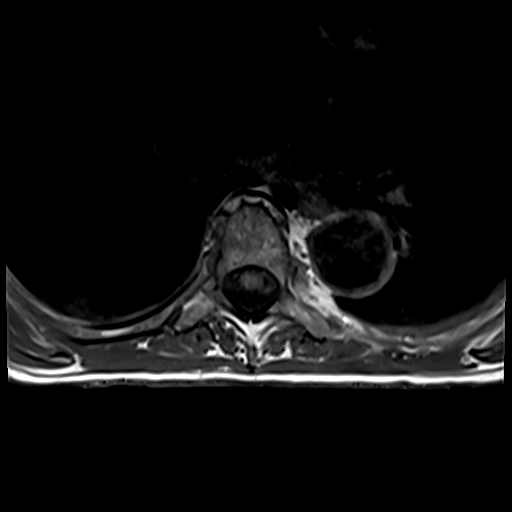
[im 28/39]
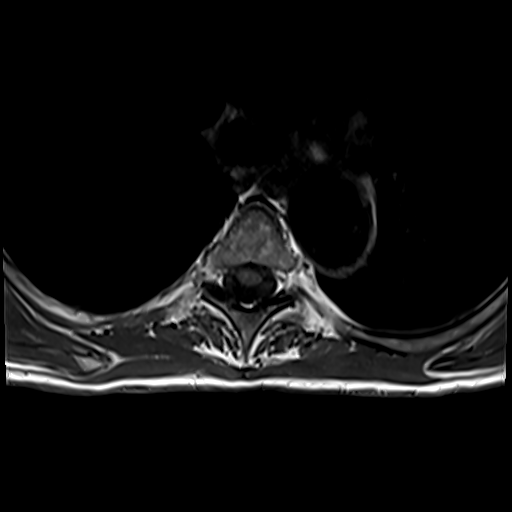
[im 33/39]
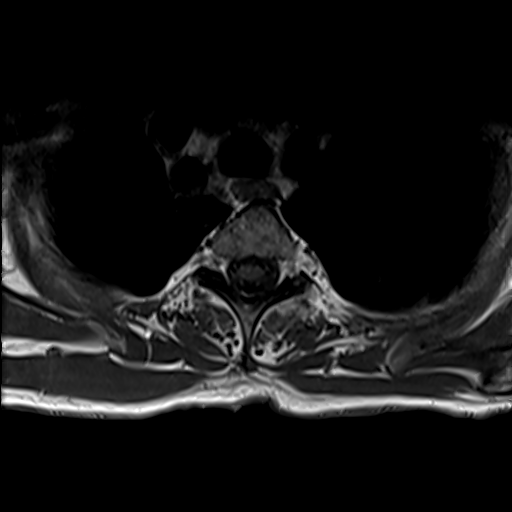
[im 39/39]
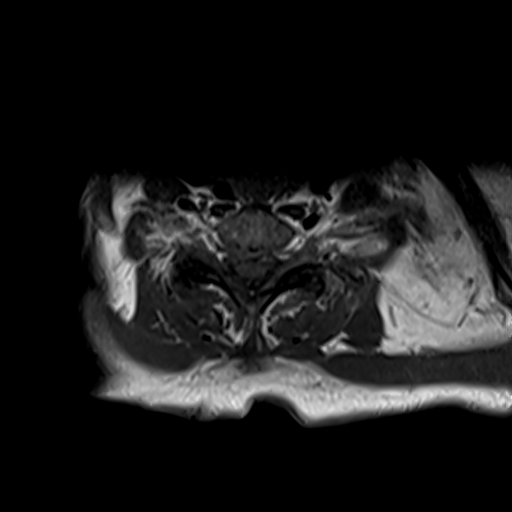

[Series 24: T1 fat-sat post-contrast · sagittal · 3.0mm · 1.06mm/px · 4 of 21 slices shown]
[im 1/21]
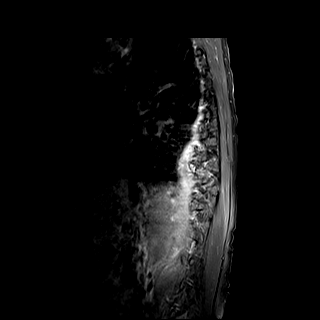
[im 7/21]
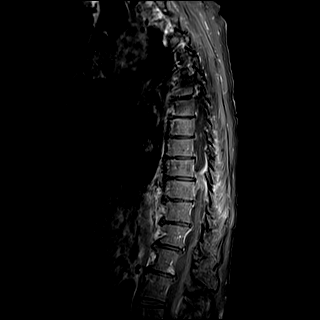
[im 14/21]
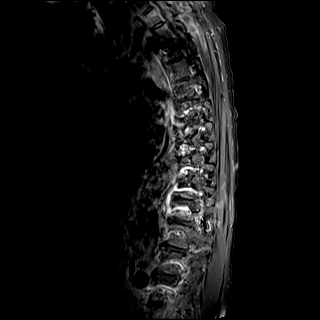
[im 21/21]
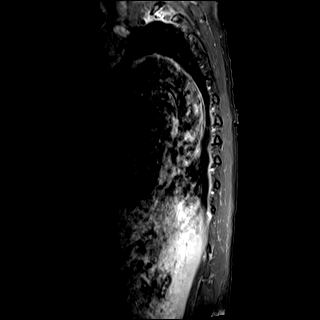

[29 of 48 positions shown; findings below may reference images not displayed]

FINDINGS: MRI THORACIC SPINE FINDINGS

Alignment:  Normal

Vertebrae: No fracture, evidence of discitis, or bone lesion.

Cord: Spinal cord is normal. However, there is a right is eccentric
and dorsal epidural collection extending from T7-T12 with peripheral
contrast enhancement. This causes mild attenuation of the thecal sac
without spinal cord compression. There is a smaller component of the
collection at the left ventral aspect of the T11 level.

Paraspinal and other soft tissues: Negative

Disc levels:

No discogenic spinal canal stenosis.

MRI LUMBAR SPINE FINDINGS

Segmentation:  Standard.

Alignment:  Grade 1 anterolisthesis at L3-4

Vertebrae: Mild edema at the inferior endplate of L1 is likely
degenerative.

Conus medullaris: The conus medullaris terminates at the L1-2 disc
level. There is mild abnormal contrast enhancement of the cauda
equina. Within the dorsal epidural space at L3-5, there is a fluid
collection measuring up to 7 mm in thickness. This severely crowds
the cauda equina nerve roots in the spinal canal.

Paraspinal and other soft tissues: Left psoas abscess measures 1.6 x
8.0 cm. There are small paraspinal abscesses on the left at the L4
level.

Disc levels:

T12-L1: Small disc bulge without spinal canal stenosis.

L1-2: Small disc bulge with endplate spurring.  No stenosis.

L2-3: Normal.

L3-4: Moderate facet hypertrophy and small disc bulge with moderate
spinal canal stenosis.

L4-5: Severe facet arthrosis and small disc bulge with moderate
spinal canal stenosis. No neural foraminal stenosis.

L5-S1: Severe facet arthrosis without spinal canal or neural
foraminal stenosis.
IMPRESSION: 1. Multifocal epidural abscess occluding T7-12 and L3-5. There is
moderate attenuation of the thecal sac.
2. Left psoas abscess and small left posterior paraspinal muscle
abscess.

## 2021-07-14 SURGERY — IRRIGATION AND DEBRIDEMENT WOUND
Anesthesia: General | Laterality: Left

## 2021-07-14 MED ORDER — ONDANSETRON HCL 4 MG/2ML IJ SOLN
INTRAMUSCULAR | Status: DC | PRN
  Administered 2021-07-14: 4 mg via INTRAVENOUS

## 2021-07-14 MED ORDER — ONDANSETRON HCL 4 MG/2ML IJ SOLN
INTRAMUSCULAR | Status: AC
  Filled 2021-07-14: qty 2

## 2021-07-14 MED ORDER — LIDOCAINE HCL (PF) 1 % IJ SOLN
INTRAMUSCULAR | Status: AC
  Filled 2021-07-14: qty 30

## 2021-07-14 MED ORDER — PROPOFOL 10 MG/ML IV BOLUS
INTRAVENOUS | Status: DC | PRN
  Administered 2021-07-14: 100 mg via INTRAVENOUS

## 2021-07-14 MED ORDER — PHENYLEPHRINE 80 MCG/ML (10ML) SYRINGE FOR IV PUSH (FOR BLOOD PRESSURE SUPPORT)
PREFILLED_SYRINGE | INTRAVENOUS | Status: AC
  Filled 2021-07-14: qty 10

## 2021-07-14 MED ORDER — DEXTROSE IN LACTATED RINGERS 5 % IV SOLN: INTRAVENOUS | Status: DC

## 2021-07-14 MED ORDER — SODIUM CHLORIDE 0.9 % IV SOLN
8.0000 mg/kg | Freq: Every day | INTRAVENOUS | Status: DC
  Administered 2021-07-15: 500 mg via INTRAVENOUS
  Filled 2021-07-14 (×2): qty 10

## 2021-07-14 MED ORDER — FENTANYL CITRATE (PF) 100 MCG/2ML IJ SOLN: 25.0000 ug | INTRAMUSCULAR | Status: DC | PRN

## 2021-07-14 MED ORDER — OXYCODONE HCL 5 MG PO TABS
5.0000 mg | ORAL_TABLET | ORAL | Status: DC | PRN
Start: 2021-07-14 — End: 2021-07-20
  Administered 2021-07-14 – 2021-07-17 (×4): 5 mg via ORAL
  Filled 2021-07-14 (×5): qty 1

## 2021-07-14 MED ORDER — CHLORHEXIDINE GLUCONATE 4 % EX LIQD
60.0000 mL | Freq: Once | CUTANEOUS | Status: AC
  Administered 2021-07-14: 4 via TOPICAL

## 2021-07-14 MED ORDER — DEXAMETHASONE SODIUM PHOSPHATE 10 MG/ML IJ SOLN
INTRAMUSCULAR | Status: DC | PRN
  Administered 2021-07-14: 10 mg via INTRAVENOUS

## 2021-07-14 MED ORDER — ACETAMINOPHEN 10 MG/ML IV SOLN
INTRAVENOUS | Status: AC
  Filled 2021-07-14: qty 100

## 2021-07-14 MED ORDER — OXYCODONE HCL 5 MG PO TABS: 5.0000 mg | ORAL_TABLET | Freq: Four times a day (QID) | ORAL | Status: DC | PRN

## 2021-07-14 MED ORDER — OXYCODONE HCL 5 MG PO TABS: 5.0000 mg | ORAL_TABLET | Freq: Once | ORAL | Status: DC | PRN

## 2021-07-14 MED ORDER — LACTATED RINGERS IV SOLN: INTRAVENOUS | Status: DC

## 2021-07-14 MED ORDER — FENTANYL CITRATE (PF) 100 MCG/2ML IJ SOLN
INTRAMUSCULAR | Status: DC | PRN
  Administered 2021-07-14 (×4): 25 ug via INTRAVENOUS

## 2021-07-14 MED ORDER — LIDOCAINE HCL (PF) 2 % IJ SOLN
INTRAMUSCULAR | Status: AC
  Filled 2021-07-14: qty 5

## 2021-07-14 MED ORDER — LIDOCAINE HCL (PF) 1 % IJ SOLN
INTRAMUSCULAR | Status: DC | PRN
  Administered 2021-07-14: 20 mL

## 2021-07-14 MED ORDER — FENTANYL CITRATE (PF) 100 MCG/2ML IJ SOLN
INTRAMUSCULAR | Status: AC
  Filled 2021-07-14: qty 2

## 2021-07-14 MED ORDER — LIDOCAINE HCL (CARDIAC) PF 100 MG/5ML IV SOSY
PREFILLED_SYRINGE | INTRAVENOUS | Status: DC | PRN
Start: 2021-07-14 — End: 2021-07-14
  Administered 2021-07-14: 60 mg via INTRAVENOUS

## 2021-07-14 MED ORDER — MORPHINE SULFATE (PF) 4 MG/ML IV SOLN
4.0000 mg | INTRAVENOUS | Status: DC | PRN
  Administered 2021-07-14 – 2021-07-16 (×5): 4 mg via INTRAVENOUS
  Filled 2021-07-14 (×5): qty 1

## 2021-07-14 MED ORDER — VANCOMYCIN HCL 1000 MG IV SOLR
INTRAVENOUS | Status: DC | PRN
  Administered 2021-07-14: 1000 mg

## 2021-07-14 MED ORDER — ACETAMINOPHEN 10 MG/ML IV SOLN: 1000.0000 mg | Freq: Once | INTRAVENOUS | Status: DC | PRN

## 2021-07-14 MED ORDER — MIDAZOLAM HCL 2 MG/2ML IJ SOLN
INTRAMUSCULAR | Status: AC
  Filled 2021-07-14: qty 2

## 2021-07-14 MED ORDER — ACETAMINOPHEN 10 MG/ML IV SOLN
INTRAVENOUS | Status: DC | PRN
  Administered 2021-07-14: 1000 mg via INTRAVENOUS

## 2021-07-14 MED ORDER — POTASSIUM CHLORIDE CRYS ER 20 MEQ PO TBCR
40.0000 meq | EXTENDED_RELEASE_TABLET | Freq: Once | ORAL | Status: AC
  Administered 2021-07-14: 40 meq via ORAL
  Filled 2021-07-14: qty 2

## 2021-07-14 MED ORDER — PHENYLEPHRINE 80 MCG/ML (10ML) SYRINGE FOR IV PUSH (FOR BLOOD PRESSURE SUPPORT)
PREFILLED_SYRINGE | INTRAVENOUS | Status: DC | PRN
  Administered 2021-07-14: 80 ug via INTRAVENOUS
  Administered 2021-07-14 (×2): 160 ug via INTRAVENOUS
  Administered 2021-07-14: 80 ug via INTRAVENOUS
  Administered 2021-07-14: 160 ug via INTRAVENOUS
  Administered 2021-07-14 (×2): 80 ug via INTRAVENOUS

## 2021-07-14 MED ORDER — ACETAMINOPHEN 325 MG PO TABS: 650.0000 mg | ORAL_TABLET | Freq: Four times a day (QID) | ORAL | Status: DC | PRN

## 2021-07-14 MED ORDER — MIDAZOLAM HCL 2 MG/2ML IJ SOLN
INTRAMUSCULAR | Status: DC | PRN
  Administered 2021-07-14: 2 mg via INTRAVENOUS

## 2021-07-14 MED ORDER — LACTATED RINGERS IV SOLN
INTRAVENOUS | Status: DC | PRN
Start: 2021-07-14 — End: 2021-07-14

## 2021-07-14 MED ORDER — ENOXAPARIN SODIUM 40 MG/0.4ML IJ SOSY: 40.0000 mg | PREFILLED_SYRINGE | INTRAMUSCULAR | Status: DC

## 2021-07-14 MED ORDER — GADOBUTROL 1 MMOL/ML IV SOLN
6.0000 mL | Freq: Once | INTRAVENOUS | Status: AC | PRN
  Administered 2021-07-14: 6 mL via INTRAVENOUS

## 2021-07-14 MED ORDER — OXYCODONE HCL 5 MG/5ML PO SOLN: 5.0000 mg | Freq: Once | ORAL | Status: DC | PRN

## 2021-07-14 MED ORDER — SODIUM CHLORIDE 0.9 % IR SOLN
Status: DC | PRN
  Administered 2021-07-14: 3000 mL

## 2021-07-14 MED ORDER — 0.9 % SODIUM CHLORIDE (POUR BTL) OPTIME
TOPICAL | Status: DC | PRN
Start: 2021-07-14 — End: 2021-07-14
  Administered 2021-07-14: 150 mL

## 2021-07-14 MED ORDER — BUPIVACAINE HCL (PF) 0.5 % IJ SOLN
INTRAMUSCULAR | Status: AC
  Filled 2021-07-14: qty 30

## 2021-07-14 MED ORDER — PROPOFOL 10 MG/ML IV BOLUS
INTRAVENOUS | Status: AC
  Filled 2021-07-14: qty 20

## 2021-07-14 MED ORDER — ONDANSETRON HCL 4 MG/2ML IJ SOLN: 4.0000 mg | Freq: Once | INTRAMUSCULAR | Status: DC | PRN

## 2021-07-14 MED ORDER — DEXAMETHASONE SODIUM PHOSPHATE 10 MG/ML IJ SOLN
INTRAMUSCULAR | Status: AC
  Filled 2021-07-14: qty 1

## 2021-07-14 MED ORDER — VANCOMYCIN HCL 1000 MG IV SOLR
INTRAVENOUS | Status: AC
  Filled 2021-07-14: qty 20

## 2021-07-14 SURGICAL SUPPLY — 45 items
BAG COUNTER SPONGE SURGICOUNT (BAG) ×2 IMPLANT
BLADE OSC/SAGITTAL MD 9X18.5 (BLADE) ×2 IMPLANT
BLADE SURG 15 STRL LF DISP TIS (BLADE) ×2 IMPLANT
BLADE SURG 15 STRL SS (BLADE) ×2
BNDG COHESIVE 4X5 TAN ST LF (GAUZE/BANDAGES/DRESSINGS) ×2 IMPLANT
BNDG ELASTIC 4X5.8 VLCR STR LF (GAUZE/BANDAGES/DRESSINGS) ×2 IMPLANT
BNDG ESMARK 4X12 TAN STRL LF (GAUZE/BANDAGES/DRESSINGS) ×2 IMPLANT
BNDG GAUZE DERMACEA FLUFF (GAUZE/BANDAGES/DRESSINGS) ×1
BNDG GAUZE DERMACEA FLUFF 4 (GAUZE/BANDAGES/DRESSINGS) IMPLANT
BNDG GAUZE ELAST 4 BULKY (GAUZE/BANDAGES/DRESSINGS) ×4 IMPLANT
BNDG STRETCH 4X75 STRL LF (GAUZE/BANDAGES/DRESSINGS) ×2 IMPLANT
DURAPREP 26ML APPLICATOR (WOUND CARE) ×2 IMPLANT
ELECT REM PT RETURN 9FT ADLT (ELECTROSURGICAL) ×2
ELECTRODE REM PT RTRN 9FT ADLT (ELECTROSURGICAL) ×1 IMPLANT
GAUZE PACKING IODOFORM 1/2 (PACKING) ×1 IMPLANT
GAUZE SPONGE 4X4 12PLY STRL (GAUZE/BANDAGES/DRESSINGS) ×1 IMPLANT
GAUZE XEROFORM 1X8 LF (GAUZE/BANDAGES/DRESSINGS) ×1 IMPLANT
GLOVE BIO SURGEON STRL SZ7 (GLOVE) ×2 IMPLANT
GLOVE SURG UNDER LTX SZ7 (GLOVE) ×2 IMPLANT
GOWN STRL REUS W/ TWL LRG LVL3 (GOWN DISPOSABLE) ×2 IMPLANT
GOWN STRL REUS W/TWL LRG LVL3 (GOWN DISPOSABLE) ×2
HANDLE YANKAUER SUCT BULB TIP (MISCELLANEOUS) IMPLANT
IV NS IRRIG 3000ML ARTHROMATIC (IV SOLUTION) ×1 IMPLANT
KIT STIMULAN RAPID CURE 5CC (Orthopedic Implant) ×1 IMPLANT
KIT TURNOVER KIT A (KITS) ×2 IMPLANT
MANIFOLD NEPTUNE II (INSTRUMENTS) ×2 IMPLANT
NDL HYPO 25X1 1.5 SAFETY (NEEDLE) ×2 IMPLANT
NDL SAFETY ECLIPSE 18X1.5 (NEEDLE) ×1 IMPLANT
NEEDLE HYPO 18GX1.5 SHARP (NEEDLE) ×1
NEEDLE HYPO 25X1 1.5 SAFETY (NEEDLE) ×4 IMPLANT
NS IRRIG 500ML POUR BTL (IV SOLUTION) ×2 IMPLANT
PACK EXTREMITY ARMC (MISCELLANEOUS) ×2 IMPLANT
PAD ABD DERMACEA PRESS 5X9 (GAUZE/BANDAGES/DRESSINGS) ×1 IMPLANT
PULSAVAC PLUS IRRIG FAN TIP (DISPOSABLE) ×2
STOCKINETTE M/LG 89821 (MISCELLANEOUS) ×2 IMPLANT
STOCKINETTE STRL 6IN 960660 (GAUZE/BANDAGES/DRESSINGS) ×2 IMPLANT
STRIP CLOSURE SKIN 1/4X4 (GAUZE/BANDAGES/DRESSINGS) ×2 IMPLANT
SUT ETHILON 3-0 FS-10 30 BLK (SUTURE) ×2
SUT VIC AB 3-0 SH 27 (SUTURE) ×2
SUT VIC AB 3-0 SH 27X BRD (SUTURE) IMPLANT
SUTURE EHLN 3-0 FS-10 30 BLK (SUTURE) IMPLANT
SWAB CULTURE AMIES ANAERIB BLU (MISCELLANEOUS) ×1 IMPLANT
SYR 10ML LL (SYRINGE) ×4 IMPLANT
TIP FAN IRRIG PULSAVAC PLUS (DISPOSABLE) IMPLANT
WATER STERILE IRR 500ML POUR (IV SOLUTION) ×2 IMPLANT

## 2021-07-14 NOTE — Progress Notes (Signed)
*  PRELIMINARY RESULTS* ?Echocardiogram ?2D Echocardiogram has been performed. ? ?Rhema Boyett, Dorene Sorrow ?07/14/2021, 9:59 AM ?

## 2021-07-14 NOTE — Consult Note (Addendum)
PODIATRY / FOOT AND ANKLE SURGERY CONSULTATION NOTE ? ?Requesting Physician: Dr. Myriam Forehand ? ?Reason for consult: L foot infection ? ?Chief Complaint: Left 1st MTPJ pain, redness, and swelling ?  ?HPI: Christy Ray is a 66 y.o. female who presents with pain to the first metatarsal phalangeal joint area of the left foot.  She notes its been painful for the past for 5 days.  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  She notes that she was in her yard and developed a blister between the first and second toes of the left foot and subsequently after that time she started to have increased pain discomfort to the left first metatarsal phalangeal joint area with increased redness and swelling.  She presented to the hospital due to this yesterday and was subsequently admitted due to concerns for sepsis.  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  Patient had an MRI performed yesterday, results still pending.  Patient currently denies nausea, vomiting, fevers, chills but does have fairly substantial pain to the left first metatarsal phalangeal joint. ? ?PMHx:  ?Past Medical History:  ?Diagnosis Date  ? Dermatomyositis (HCC)   ? Polyarthritis   ? ? ?Surgical Hx: History reviewed. No pertinent surgical history. ? ?FHx: History reviewed. No pertinent family history. ? ?Social History:  reports that she has been smoking cigarettes. She has been smoking an average of .5 packs per day. She has never used smokeless tobacco. She reports that she does not drink alcohol and does not use drugs. ? ?Allergies: No Known Allergies ? ?Medications Prior to Admission  ?Medication Sig Dispense Refill  ? amLODipine (NORVASC) 2.5 MG tablet Take 2.5 mg by mouth daily.    ? folic acid (FOLVITE) 1 MG tablet Take 1 mg by mouth daily.    ? hydrOXYzine (ATARAX) 10 MG tablet Take 10 mg by mouth at bedtime.    ?  losartan-hydrochlorothiazide (HYZAAR) 100-12.5 MG tablet Take 1 tablet by mouth daily.    ? mycophenolate (CELLCEPT) 500 MG tablet Take 1,000 mg by mouth 2 (two) times daily.    ? predniSONE (DELTASONE) 10 MG tablet Take 40 mg by mouth daily.    ? rosuvastatin (CRESTOR) 10 MG tablet Take 10 mg by mouth daily.    ? methotrexate (RHEUMATREX) 2.5 MG tablet Take 20 mg by mouth once a week. (Patient not taking: Reported on 07/12/2021)    ? ? ?Physical Exam: ?General: Alert and oriented.  No apparent distress. ? ?Vascular: DP/PT pulses palpable bilateral, capillary fill time appears to be intact to digits bilaterally, no hair growth noted to digits bilaterally.  Moderate to severe swelling noted to the left first metatarsal phalangeal joint with associated erythema. ? ?Neuro: Light touch sensation intact to digits bilaterally. ? ?Derm: No open lesions present to bilateral lower extremities.  No open wounds present to the left first metatarsal phalangeal joint area or digits. ? ? ? ?MSK: Palpable fluctuance prominence over the left first metatarsal phalangeal joint with associated erythema and edema present to the area.  Pain on palpation to the left first metatarsal phalangeal joint, reduced range of motion noted to the area consistent with likely DJD. ? ?Results for orders placed or performed during the hospital encounter of 07/12/21 (from the past 48 hour(s))  ?Procalcitonin - Baseline     Status: None  ? Collection Time: 07/12/21  8:50 AM  ?Result Value  Ref Range  ? Procalcitonin 0.72 ng/mL  ?  Comment:        ?Interpretation: ?PCT > 0.5 ng/mL and <= 2 ng/mL: ?Systemic infection (sepsis) is possible, ?but other conditions are known to elevate ?PCT as well. ?(NOTE) ?      Sepsis PCT Algorithm           Lower Respiratory Tract ?                                     Infection PCT Algorithm ?   ----------------------------     ---------------------------- ?        PCT < 0.25 ng/mL                PCT < 0.10 ng/mL ? ?         Strongly encourage             Strongly discourage ?  discontinuation of antibiotics    initiation of antibiotics ?   ----------------------------     ----------------------------- ?      PCT 0.25 - 0.50 ng/mL            PCT 0.10 - 0.25 ng/mL ?              OR ?      >80% decrease in PCT            Discourage initiation of ?                                           antibiotics ?     Encourage discontinuation ?          of antibiotics ?   ----------------------------     ----------------------------- ?        PCT >= 0.50 ng/mL              PCT 0.26 - 0.50 ng/mL ?               AND ?      <80% decrease in PCT             Encourage initiation of ?                                            antibiotics ?      Encourage continuation ?          of antibiotics ?   ----------------------------     ----------------------------- ?       PCT >= 0.50 ng/mL                  PCT > 0.50 ng/mL ?              AND ?        increase in PCT                  Strongly encourage ?                                     initiation of antibiotics ?   Strongly encourage escalation ?  of antibiotics ?                                    ----------------------------- ?                                          PCT <= 0.25 ng/mL ?                                                OR ?                                       > 80% decrease in PCT ? ?                                    Discontinue / Do not initiate ?                                            antibiotics ? ?Performed at [facility], [address] [place], ?[place] [id number] ?  ?CBC with Differential/Platelet     Status: Abnormal  ? Collection Time: 07/12/21  8:50 AM  ?Result Value Ref Range  ? WBC 23.6 (H) 4.0 - 10.5 K/uL  ? RBC 4.83 3.87 - 5.11 MIL/uL  ? Hemoglobin 13.6 12.0 - 15.0 g/dL  ? HCT 40.1 36.0 - 46.0 %  ? MCV 83.0 80.0 - 100.0 fL  ? MCH 28.2 26.0 - 34.0 pg  ? MCHC 33.9 30.0 - 36.0 g/dL  ? RDW 16.1 (H) 11.5 - 15.5 %  ? Platelets 177 150 - 400 K/uL  ? nRBC  0.0 0.0 - 0.2 %  ? Neutrophils Relative % 84 %  ? Neutro Abs 19.8 (H) 1.7 - 7.7 K/uL  ? Lymphocytes Relative 6 %  ? Lymphs Abs 1.5 0.7 - 4.0 K/uL  ? Monocytes Relative 5 %  ? Monocytes Absolute 1.1 (H) 0.1 - 1.0 K/uL  ? Eosinophils Relative 0 %  ? Eosinophils Absolute 0.0 0.0 - 0.5 K/uL  ? Basophils Relative 1 %  ? Basophils Absolute 0.1 0.0 - 0.1 K/uL  ? Immature Granulocytes 4 %  ? Abs Immature Granulocytes 0.98 (H) 0.00 - 0.07 K/uL  ?  Comment: Performed at [facility], [address] [place], [place] [address]  ?Comprehensive metabolic panel     Status: Abnormal  ? Collection Time: 07/12/21  8:50 AM  ?Result Value Ref Range  ? Sodium 137 135 - 145 mmol/L  ? Potassium 2.6 (LL) 3.5 - 5.1 mmol/L  ?  Comment: CRITICAL RESULT CALLED TO, READ BACK BY AND VERIFIED WITH ?Clementeen HoofSTEPHANIE RUDD RN [id number] 07/12/21 [facility] ?  ? Chloride 99 98 - 111 mmol/L  ? CO2 26 22 - 32 mmol/L  ? Glucose, Bld 117 (H) 70 - 99 mg/dL  ?  Comment: Glucose reference range applies only to samples taken after fasting for at least  8 hours.  ? BUN 28 (H) 8 - 23 mg/dL  ? Creatinine, Ser 0.69 0.44 - 1.00 mg/dL  ? Calcium 9.0 8.9 - 10.3 mg/dL  ? Total Protein 6.9 6.5 - 8.1 g/dL  ? Albumin 3.0 (L) 3.5 - 5.0 g/dL  ? AST 20 15 - 41 U/L  ? ALT 39 0 - 44 U/L  ? Alkaline Phosphatase 384 (H) 38 - 126 U/L  ? Total Bilirubin 1.5 (H) 0.3 - 1.2 mg/dL  ? GFR, Estimated >60 >60 mL/min  ?  Comment: (NOTE) ?Calculated using the CKD-EPI Creatinine Equation (2021) ?  ? Anion gap 12 5 - 15  ?  Comment: Performed at [facility], [address], [place], [place] [address]  ?Sedimentation rate     Status: Abnormal  ? Collection Time: 07/12/21  8:50 AM  ?Result Value Ref Range  ? Sed Rate 68 (H) 0 - 30 mm/hr  ?  Comment: Performed at [facility], [address] [place], [place] [address]  ?Uric acid     Status: None  ? Collection Time: 07/12/21  8:50 AM  ?Result Value Ref Range  ? Uric Acid, Serum 2.5 2.5 - 7.1 mg/dL  ?  Comment: Performed at  [facility], [address] [place], [place] [address]  ?Magnesium     Status: None  ? Collection Time: 07/12/21  8:50 AM  ?Result Value Ref Range  ? Magnesium 2.2 1.7 - 2.4 mg/dL  ?  Comment: Performed at [facility]

## 2021-07-14 NOTE — Plan of Care (Signed)

## 2021-07-14 NOTE — H&P (Addendum)
HISTORY AND PHYSICAL INTERVAL NOTE: ? ?07/14/2021 ? ?4:29 PM ? ?Christy Ray  has presented today for surgery, with the diagnosis of osteomyelitis, septic joint left 1st MTPJ, abscess.  The various methods of treatment have been discussed with the patient.  No guarantees were given.  After consideration of risks, benefits and other options for treatment, the patient has consented to surgery.  I have reviewed the patients? chart and labs.   ? ?PROCEDURE: ?LEFT FOOT INCISION AND DRAINAGE, MULTIPLE POSSIBLE TO BONE ?LEFT FOOT POSSIBLE 1ST MTPJ RESECTION WITH ABX BEAD APPLICATION ?LEFT 1ST MTPJ BONE BIOPSY/CULTURE ? ? ?A history and physical examination was performed in the hospital.  The patient was reexamined.  There have been no changes to this history and physical examination. ? ?Rosetta Posner, DPM ? ?

## 2021-07-14 NOTE — Op Note (Signed)
PODIATRY / FOOT AND ANKLE SURGERY OPERATIVE REPORT ? ? ? ?SURGEON: Rosetta Posner, DPM ? ?PRE-OPERATIVE DIAGNOSIS:  ?1.  Septic joint left first metatarsal phalangeal joint ?2.  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 ?POST-OPERATIVE DIAGNOSIS: Same ? ?PROCEDURE(S): ?Left first metatarsal phalangeal joint resection ?Left foot incision and drainage with removal of all nonviable necrotic tissues to level of bone ?Application of antibiotic beads left first metatarsal phalangeal joint ? ?HEMOSTASIS: Left ankle tourniquet ? ?ANESTHESIA: general ? ?ESTIMATED BLOOD LOSS: 20 cc ? ?FINDING(S): ?1.  Purulent drainage within the first metatarsal phalangeal joint no obvious septic joint present with soft joint surface consistent with osteomyelitis. ?2.  Abscess present within the first metatarsal phalangeal joint that extended to the first interspace. ? ?PATHOLOGY/SPECIMEN(S): Bone culture and path specimen first metatarsal phalangeal joint, proximal margin marked in purple ink first metatarsal head: Wound culture ? ?INDICATIONS:   ?MAHAGONY GRIEB is a 66 y.o. female who presents with pain and discomfort to the left first metatarsal phalangeal joint and multiple areas throughout body.  Patient was noted to have cellulitis upon examination in the emergency room had x-rays taken which did show changes at the first metatarsal phalangeal joint consistent with arthritic changes.  Patient had concern for septic arthritis as patient had fevers as well as blood culture positive for MRSA.  Patient noted that she wore sandals over the weekend and got a scrape on her toe and believes that this could have caused this to happen.  MRI was performed which showed an abscess to the first metatarsal phalangeal joint consistent with septic joint with possible osteomyelitis and abscess spreading to the first interspace.  All treatment options were discussed with the patient both conservative and surgical attempts at  correction including potential risks and complications at this time patient is elected for surgical procedure. ? ?DESCRIPTION: ?After obtaining full informed written consent, the patient was brought back to the operating room and placed supine upon the operating table.  The patient received IV antibiotics prior to induction.  After obtaining adequate anesthesia, a preoperative block was performed with 20 cc of 1% lidocaine plain.  The patient was prepped and draped in the standard fashion.  An Esmarch bandage was used to exsanguinate the left lower extremity and pneumatic ankle tourniquet was inflated. ? ?Attention was directed to the first metatarsal phalangeal joint where a linear longitudinal incision was made slightly medial to the tendon of the extensor hallucis longus.  The incision was deepened through the subcutaneous tissues utilizing sharp and blunt dissection and care was taken to identify and retract all vital neurovascular structures and all venous contributories were cauterized as necessary.  At this time a capsular incision and periosteal incision was made into the first metatarsal medial to the tendon of the extensor hallucis longus.  Immediately purulence was expressible from the first metatarsal phalangeal joint when entering the joint capsule consistent with septic joint.  The capsular and periosteal tissue was reflected medially and laterally at the operative site thereby exposing the first metatarsal phalangeal joint.  There appeared to be a large dorsal exostosis present that appeared to be loose as a loose body present to the joint.  There appeared to be notable purulence around the area.  This area was resected and passed off the operative site.  The first metatarsal head appeared to be very soft as well as the base the proximal phalanx and did not have any cartilage present concerning for osteomyelitis and septic joint.  At this time sagittal bone saw was used to resect the first metatarsal  head and proximal phalanx base which were passed off the operative site and the proximal margin was marked in purple ink.  A bone culture was also taken and passed off the operative site of the first metatarsal head.  The sesamoids also appeared to have purulent drainage coming from that area and the sesamoids appear to be severely degenerated so it was determined at this time to also resect the sesamoids which were performed carefully keeping the flexor tendon intact as well as much of the plantar plate as possible.  The sesamoids were also passed off the operative site and sent off with the path specimen.  Dissection was then continued down further to the first interspace via the same incision and there was notable purulence also expressed from the first interspace area around the adductor hallucis muscle belly consistent with MRI findings.  All nonviable necrotic tissue was resected and passed off in the operative site.  The surgical site was flushed with copious amounts normal sterile saline with a pulse lavage with 3 L.  Any nonviable necrotic tissue was once again resected and passed off the operative site.  The tissues that remained appeared to be healthy and viable at this time.  The first metatarsal phalange joint appeared to be fairly loose overall but appeared to still sit in a fairly rectus position with loading of the foot.  At this time the wound was packed with antibiotic beads at the first metatarsal phalangeal joint and first interspace area.  A small stab incision was made over the first interspace and iodoform packing gauze was then packed into the first interspace area.  The capsular and periosteal tissues at the first metatarsal phalangeal joint area were reapproximated with 3-0 Vicryl.  The skin was then reapproximated well coapted with 3-0 nylon in a combination of simple and horizontal mattress type stitching. ? ?The pneumatic ankle tourniquet was deflated and a prompt traumatic response was  noted all digits of the left foot.  A postoperative dressing was then applied consisting of Xeroform followed by 4 x 4 gauze, ABD, Kerlix, Ace wrap.  The patient tolerated the procedure and anesthesia well was transferred to recovery room vital signs stable vascular status appearing to be intact to all toes left foot.  Following a period of postoperative monitoring the patient be discharged back to the inpatient room with the appropriate orders, instructions, medications.  We will see patient back again tomorrow and to remove packing gauze.  Recommend IV antibiotics for 6 weeks.  Patient may ultimately need a revision of the procedure consisting of partial first ray amputation if worsening condition occurs or if toe deformity occurs. ? ?COMPLICATIONS: None ? ?CONDITION: Good, stable ? ?Rosetta Posner, DPM ? ?

## 2021-07-14 NOTE — Progress Notes (Addendum)
? ? ? ?Progress Note  ? ? ?Christy PillowJamie J Ray  Christy Ray Christy Ray  DOA: 07/12/2021 ?PCP: Ethelda ChickSmith, Kristi M, MD  ? ? ? ? ?Brief Narrative:  ? ? ?Medical records reviewed and are as summarized below: ? ?Christy PillowJamie J Dung is a 66 y.o. 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  It started from the left great toe about 4 to 5 days prior to admission but symptoms progressively worsened. ? ?She was admitted to the hospital for left foot cellulitis.  Blood culture showed Staph aureus.  Staph aureus bacteremia. ? ? ? ? ?Assessment/Plan:  ? ?Principal Problem: ?  Cellulitis of left foot ?Active Problems: ?  Gout ?  Hypokalemia ?  Essential hypertension ?  Dermatomyositis (HCC) ?  Nicotine dependence ?  Staphylococcus aureus bacteremia ?  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 ? ? ?Body mass index is 22.13 kg/m?. ? ? ? ?Left foot cellulitis, septic left fifth metatarsal phalangeal joint, Staph aureus bacteremia, leukocytosis, immunocompromised state: ?Continue IV vancomycin and analgesics.  No vegetations on 2D echo.  2D echo showed EF greater than 75%, grade 1 diastolic dysfunction. ?Plan for I&D today.  Follow-up with podiatrist. ?Hydrate with IV fluids because of n.p.o. status ? ?Back pain: Obtain MRI with and without contrast of the lumbar and thoracic spine given Staph aureus bacteremia. ? ?Hypokalemia: Improved.  Continue potassium repletion. ? ?Dermatomyositis: Continue prednisone.  Hold CellCept. ? ?  ? ?Diet Order   ? ?       ?  Diet NPO time specified  Diet effective ____       ?  ? ?  ?  ? ?  ? ? ? ? ? ? ? ? ?Consultants: ?Infectious disease ? ?Procedures: ?None ? ? ? ?Medications:  ? ? amLODipine  2.5 mg Oral Daily  ? enoxaparin (LOVENOX) injection  40 mg Subcutaneous Q24H  ? folic acid  1 mg Oral Daily  ? losartan  100 mg  Oral Daily  ? And  ? hydrochlorothiazide  12.5 mg Oral Daily  ? hydrOXYzine  10 mg Oral QHS  ? predniSONE  40 mg Oral Q breakfast  ? rosuvastatin  10 mg Oral Daily  ? sodium chloride flush  3 mL Intravenous Q12H  ? ?Continuous Infusions: ? sodium chloride 250 mL (07/13/21 0124)  ? dextrose 5% lactated ringers 75 mL/hr at 07/14/21 1200  ? vancomycin 1,250 mg (07/14/21 0044)  ? ? ? ?Anti-infectives (From admission, onward)  ? ? Start     Dose/Rate Route Frequency Ordered Stop  ? 07/14/21 0100  vancomycin (VANCOREADY) IVPB 1250 mg/250 mL       ? 1,250 mg ?166.7 mL/hr over 90 Minutes Intravenous Every 24 hours 07/13/21 0131    ? 07/13/21 0145  vancomycin (VANCOREADY) IVPB 1500 mg/300 mL       ? 1,500 mg ?150 mL/hr over 120 Minutes Intravenous  Once 07/13/21 0057 07/14/21 1203  ? 07/12/21 1045  cefTRIAXone (ROCEPHIN) 2 g in sodium chloride 0.9 % 100 mL IVPB  Status:  Discontinued       ? 2 g ?200 mL/hr over 30 Minutes Intravenous Every 24 hours 07/12/21 1035 07/13/21 0058  ? ?  ? ? ? ? ? ? ? ? ? ?Family Communication/Anticipated D/C date and plan/Code Status  ? ?DVT prophylaxis: enoxaparin (LOVENOX) injection 40  mg Start: 07/12/21 1400 ? ? ?  Code Status: Full Code ? ?Family Communication: None ?Disposition Plan: Plan to discharge home in 2 to 3 days ? ? ?Status is: Inpatient ?Remains inpatient appropriate because: IV antibiotics ? ? ? ? ? ? ?Subjective:  ? ?She complains of pain on the left big toe and left proximal foot.  She also reports mid back pain/back spasms. ? ?Objective:  ? ? ?Vitals:  ? 07/13/21 2319 07/14/21 0513 07/14/21 0857 07/14/21 1204  ?BP: (!) 155/94 (!) 165/100 (!) 168/91 (!) 147/98  ?Pulse: 93 (!) 109 (!) 102 (!) 106  ?Resp: 15 20 18 18   ?Temp: 98.4 ?F (36.9 ?C) 98.4 ?F (36.9 ?C) 98.2 ?F (36.8 ?C) 98.7 ?F (37.1 ?C)  ?TempSrc:   Oral Oral  ?SpO2: 100% 98% 97% 98%  ?Weight:      ?Height:      ? ?No data found. ? ? ?Intake/Output Summary (Last 24 hours) at 07/14/2021 1452 ?Last data filed at 07/13/2021  1500 ?Gross per 24 hour  ?Intake 6.88 ml  ?Output --  ?Net 6.88 ml  ? ?Filed Weights  ? 07/12/21 0843  ?Weight: 60.3 kg  ? ? ?Exam: ? ?GEN: NAD ?SKIN: Scattered patchy rash on her back and chest ?EYES: EOMI ?ENT: MMM ?CV: RRR ?PULM: CTA B ?ABD: soft, ND, NT, +BS ?CNS: AAO x 3, non focal ?EXT: Erythema, tenderness and mild swelling of dorsal aspect of proximal foot, especially of the left big toe ? ? ? ? ? ?Data Reviewed:  ? ?I have personally reviewed following labs and imaging studies: ? ?Labs: ?Labs show the following:  ? ?Basic Metabolic Panel: ?Recent Labs  ?Lab 07/12/21 ?0850 07/13/21 ?0259 07/14/21 ?0424  ?NA 137 138  --   ?K 2.6* 3.3* 3.5  ?CL 99 102  --   ?CO2 26 26  --   ?GLUCOSE 117* 110*  --   ?BUN 28* 19  --   ?CREATININE 0.69 0.52  --   ?CALCIUM 9.0 8.7*  --   ?MG 2.2  --  2.2  ? ?GFR ?Estimated Creatinine Clearance: 60.6 mL/min (by C-G formula based on SCr of 0.52 mg/dL). ?Liver Function Tests: ?Recent Labs  ?Lab 07/12/21 ?0850  ?AST 20  ?ALT 39  ?ALKPHOS 384*  ?BILITOT 1.5*  ?PROT 6.9  ?ALBUMIN 3.0*  ? ?No results for input(s): LIPASE, AMYLASE in the last 168 hours. ?No results for input(s): AMMONIA in the last 168 hours. ?Coagulation profile ?No results for input(s): INR, PROTIME in the last 168 hours. ? ?CBC: ?Recent Labs  ?Lab 07/12/21 ?0850 07/13/21 ?0259 07/14/21 ?0424  ?WBC 23.6* 26.2* 26.0*  ?NEUTROABS 19.8*  --   --   ?HGB 13.6 11.4* 12.4  ?HCT 40.1 33.6* 35.1*  ?MCV 83.0 83.0 81.3  ?PLT 177 166 175  ? ?Cardiac Enzymes: ?No results for input(s): CKTOTAL, CKMB, CKMBINDEX, TROPONINI in the last 168 hours. ?BNP (last 3 results) ?No results for input(s): PROBNP in the last 8760 hours. ?CBG: ?No results for input(s): GLUCAP in the last 168 hours. ?D-Dimer: ?No results for input(s): DDIMER in the last 72 hours. ?Hgb A1c: ?No results for input(s): HGBA1C in the last 72 hours. ?Lipid Profile: ?No results for input(s): CHOL, HDL, LDLCALC, TRIG, CHOLHDL, LDLDIRECT in the last 72 hours. ?Thyroid function  studies: ?No results for input(s): TSH, T4TOTAL, T3FREE, THYROIDAB in the last 72 hours. ? ?Invalid input(s): FREET3 ?Anemia work up: ?No results for input(s): VITAMINB12, FOLATE, FERRITIN, TIBC, IRON, RETICCTPCT in the last 72  hours. ?Sepsis Labs: ?Recent Labs  ?Lab 07/12/21 ?0850 07/12/21 ?1118 07/12/21 ?1314 07/13/21 ?0259 07/14/21 ?0424  ?PROCALCITON 0.72  --   --  0.65 1.26  ?WBC 23.6*  --   --  26.2* 26.0*  ?LATICACIDVEN  --  1.5 1.2  --   --   ? ? ?Microbiology ?Recent Results (from the past 240 hour(s))  ?Culture, blood (single)     Status: Abnormal (Preliminary result)  ? Collection Time: 07/12/21 10:57 AM  ? Specimen: BLOOD  ?Result Value Ref Range Status  ? Specimen Description   Final  ?  BLOOD LEFT ANTECUBITAL ?Performed at [facility], [address], [place], [place] [address] ?  ? Special Requests   Final  ?  BOTTLES DRAWN AEROBIC AND ANAEROBIC Blood Culture adequate volume ?Performed at [facility], [address], [place], [place] [address] ?  ? Culture  Setup Time   Final  ?  GRAM POSITIVE COCCI ?IN BOTH AEROBIC AND ANAEROBIC BOTTLES ?CRITICAL RESULT CALLED TO, READ BACK BY AND VERIFIED WITH: ?NATHAN [email or web address]@0015  07/13/21 RH ?  ? Culture (A)  Final  ?  STAPHYLOCOCCUS AUREUS ?SUSCEPTIBILITIES TO FOLLOW ?Performed at [facility], [address] [place], [place] [address] ?  ? Report Status PENDING  Incomplete  ?Blood Culture ID Panel (Reflexed)     Status: Abnormal  ? Collection Time: 07/12/21 10:57 AM  ?Result Value Ref Range Status  ? Enterococcus faecalis NOT DETECTED NOT DETECTED Final  ? Enterococcus Faecium NOT DETECTED NOT DETECTED Final  ? Listeria monocytogenes NOT DETECTED NOT DETECTED Final  ? Staphylococcus species DETECTED (A) NOT DETECTED Final  ?  Comment: CRITICAL RESULT CALLED TO, READ BACK BY AND VERIFIED WITH: ?NATHAN [email or web address]@[email or web address]  07/13/21 RH ?  ? Staphylococcus aureus (BCID) DETECTED (A) NOT DETECTED Final  ?  Comment: Methicillin (oxacillin)-resistant  Staphylococcus aureus (MRSA). MRSA is predictably resistant to beta-lactam antibiotics (except ceftaroline). Preferred therapy is vancomycin unless clinically contraindicated. Patient requires contact prec

## 2021-07-14 NOTE — TOC Initial Note (Signed)
Transition of Care (TOC) - Initial/Assessment Note  ? ? ?Patient Details  ?Name: Christy Ray ?MRN: [id number] ?Date of Birth: October 25, 1955 ? ?Transition of Care (TOC) CM/SW Contact:    ?Conception Oms, RN ?Phone Number: ?07/14/2021, 9:44 AM ? ?Clinical Narrative:                 ? ?Podiatry, ID following, TOC will continue to Monitor and assist with dc planning ?  ?  ? ? ?Patient Goals and CMS Choice ?  ?  ?  ? ?Expected Discharge Plan and Services ?  ?  ?  ?  ?  ?                ?  ?  ?  ?  ?  ?  ?  ?  ?  ?  ? ?Prior Living Arrangements/Services ?  ?  ?  ?       ?  ?  ?  ?  ? ?Activities of Daily Living ?Home Assistive Devices/Equipment: Contact lenses, Eyeglasses ?ADL Screening (condition at time of admission) ?Patient's cognitive ability adequate to safely complete daily activities?: Yes ?Is the patient deaf or have difficulty hearing?: No ?Does the patient have difficulty seeing, even when wearing glasses/contacts?: No ?Does the patient have difficulty concentrating, remembering, or making decisions?: No ?Patient able to express need for assistance with ADLs?: Yes ?Does the patient have difficulty dressing or bathing?: No ?Independently performs ADLs?: Yes (appropriate for developmental age) ?Does the patient have difficulty walking or climbing stairs?: No ?Weakness of Legs: Left ?Weakness of Arms/Hands: None ? ?Permission Sought/Granted ?  ?  ?   ?   ?   ?   ? ?Emotional Assessment ?  ?  ?  ?  ?  ?  ? ?Admission diagnosis:  Cellulitis of left foot [L03.116] ?Sepsis, due to unspecified organism, unspecified whether acute organ dysfunction present ([place]) [A41.9] ?Patient Active Problem List  ? Diagnosis Date Noted  ? Staphylococcus aureus bacteremia 07/13/2021  ? Cellulitis of left foot 07/12/2021  ? Gout 07/12/2021  ? Hypokalemia 07/12/2021  ? Essential hypertension 07/12/2021  ? Dermatomyositis ([place]) 07/12/2021  ? Nicotine dependence 07/12/2021  ? ?PCP:  Wardell Honour, MD ?Pharmacy:   ?[id number] [address], [place] ?[address] ?[place] [address] ?Phone: [phone] Fax: [phone] ? ? ? ? ?Social Determinants of Health (SDOH) Interventions ?  ? ?Readmission Risk Interventions ?   ? View : No data to display.  ?  ?  ?  ? ? ? ?

## 2021-07-14 NOTE — Anesthesia Postprocedure Evaluation (Signed)
Anesthesia Post Note ? ?Patient: Christy Ray ? ?Procedure(s) Performed: IRRIGATION AND DEBRIDEMENT WOUND (Left) ?JOINT RESECTION (Left) ? ?Patient location during evaluation: PACU ?Anesthesia Type: General ?Level of consciousness: awake and alert, oriented and patient cooperative ?Pain management: pain level controlled ?Vital Signs Assessment: post-procedure vital signs reviewed and stable ?Respiratory status: spontaneous breathing, nonlabored ventilation and respiratory function stable ?Cardiovascular status: blood pressure returned to baseline and stable ?Postop Assessment: adequate PO intake ?Anesthetic complications: no ? ? ?No notable events documented. ? ? ?Last Vitals:  ?Vitals:  ? 07/14/21 1845 07/14/21 1902  ?BP: 131/85 116/83  ?Pulse: 91 94  ?Resp: 10 16  ?Temp:  36.6 ?C  ?SpO2: 95% 94%  ?  ?Last Pain:  ?Vitals:  ? 07/14/21 1902  ?TempSrc: Oral  ?PainSc:   ? ? ?  ?  ?  ?  ?  ?  ? ?[name] ? ? ? ? ?

## 2021-07-14 NOTE — Anesthesia Preprocedure Evaluation (Addendum)
Anesthesia Evaluation  ?Patient identified by MRN, date of birth, ID band ?Patient awake ? ? ? ?Reviewed: ?Allergy & Precautions, NPO status , Patient's Chart, lab work & pertinent test results ? ?History of Anesthesia Complications ?Negative for: history of anesthetic complications ? ?Airway ?Mallampati: III ? ? ?Neck ROM: Full ? ? ? Dental ? ?(+) Missing ?  ?Pulmonary ?Current Smoker (1/2 ppd) and Patient abstained from smoking.,  ?  ?Pulmonary exam normal ?breath sounds clear to auscultation ? ? ? ? ? ? Cardiovascular ?hypertension, Normal cardiovascular exam ?Rhythm:Regular Rate:Normal ? ?Echo 01/21/20:  ???NORMAL LEFT VENTRICULAR SYSTOLIC FUNCTION  ???NORMAL LA PRESSURES WITH NORMAL DIASTOLIC FUNCTION  ???MILD RV SYSTOLIC DYSFUNCTION  ???VALVULAR REGURGITATION: MILD AR, TRIVIAL MR, TRIVIAL PR, TRIVIAL TR  ???NO VALVULAR STENOSIS  ???TRIVIAL PERICARDIAL EFFUSION  ???INSUFFICIENT TR TO ESTIMATE RVSP  ???Compared with prior Echo study on 02/13/2019: NO SIGNIFICANT CHANGES.  ?  ?Neuro/Psych ?negative neurological ROS ?   ? GI/Hepatic ?GERD  ,  ?Endo/Other  ?negative endocrine ROS ? Renal/GU ?negative Renal ROS  ? ?  ?Musculoskeletal ? ?(+) Arthritis , Gout; left foot cellulitis  ? Abdominal ?  ?Peds ? Hematology ?negative hematology ROS ?(+)   ?Anesthesia Other Findings ? ? Reproductive/Obstetrics ? ?  ? ? ? ? ? ? ? ? ? ? ? ? ? ?  ?  ? ? ? ? ? ? ?Anesthesia Physical ?Anesthesia Plan ? ?ASA: 2 and emergent ? ?Anesthesia Plan: General  ? ?Post-op Pain Management:   ? ?Induction: Intravenous ? ?PONV Risk Score and Plan: 2 and Ondansetron, Dexamethasone and Treatment may vary due to age or medical condition ? ?Airway Management Planned: LMA ? ?Additional Equipment:  ? ?Intra-op Plan:  ? ?Post-operative Plan: Extubation in OR ? ?Informed Consent: I have reviewed the patients History and Physical, chart, labs and discussed the procedure including the risks, benefits and alternatives for  the proposed anesthesia with the patient or authorized representative who has indicated his/her understanding and acceptance.  ? ? ? ?Dental advisory given ? ?Plan Discussed with: CRNA ? ?Anesthesia Plan Comments: (Patient consented for risks of anesthesia including but not limited to:  ?- adverse reactions to medications ?- damage to eyes, teeth, lips or other oral mucosa ?- nerve damage due to positioning  ?- sore throat or hoarseness ?- damage to heart, brain, nerves, lungs, other parts of body or loss of life ? ?Informed patient about role of CRNA in peri- and intra-operative care.  Patient voiced understanding.)  ? ? ? ? ? ?Anesthesia Quick Evaluation ? ?

## 2021-07-14 NOTE — Progress Notes (Signed)
Pt was in OR when I went to see around 3.30pm ? ?Date of Admission:  07/12/2021   Medications:  ? amLODipine  2.5 mg Oral Daily  ? enoxaparin (LOVENOX) injection  40 mg Subcutaneous Q24H  ? folic acid  1 mg Oral Daily  ? losartan  100 mg Oral Daily  ? And  ? hydrochlorothiazide  12.5 mg Oral Daily  ? hydrOXYzine  10 mg Oral QHS  ? predniSONE  40 mg Oral Q breakfast  ? rosuvastatin  10 mg Oral Daily  ? sodium chloride flush  3 mL Intravenous Q12H  ? ? ?Objective: ?Vital signs in last 24 hours: ?Temp:  [98.1 ?F (36.7 ?C)-98.7 ?F (37.1 ?C)] 98.2 ?F (36.8 ?C) (05/17 0857) ?Pulse Rate:  [93-109] 102 (05/17 0857) ?Resp:  [15-20] 18 (05/17 0857) ?BP: (119-168)/(84-100) 168/91 (05/17 0857) ?SpO2:  [96 %-100 %] 97 % (05/17 0857) ? ?Lab Results ?Recent Labs  ?  07/12/21 ?0850 07/13/21 ?0259 07/14/21 ?0424  ?WBC 23.6* 26.2* 26.0*  ?HGB 13.6 11.4* 12.4  ?HCT 40.1 33.6* 35.1*  ?NA 137 138  --   ?K 2.6* 3.3* 3.5  ?CL 99 102  --   ?CO2 26 26  --   ?BUN 28* 19  --   ?CREATININE 0.69 0.52  --   ? ?Liver Panel ?Recent Labs  ?  07/12/21 ?0850  ?PROT 6.9  ?ALBUMIN 3.0*  ?AST 20  ?ALT 39  ?ALKPHOS 384*  ?BILITOT 1.5*  ? ?Sedimentation Rate ?Recent Labs  ?  07/12/21 ?0850  ?ESRSEDRATE 68*  ? ?C-Reactive Protein ?No results for input(s): CRP in the last 72 hours. ? ?Microbiology: ? ?Studies/Results: ?MR FOOT LEFT WO CONTRAST ? ?Result Date: 07/14/2021 ?CLINICAL DATA:  Foot swelling, diabetic, osteomyelitis suspected, xray done EXAM: MRI OF THE LEFT FOOT WITHOUT CONTRAST TECHNIQUE: Multiplanar, multisequence MR imaging of the left forefoot was performed. No intravenous contrast was administered. COMPARISON:  X-ray 07/12/2021 FINDINGS: Technical Note: Despite efforts by the technologist and patient, motion artifact is present on today's exam and could not be eliminated. This reduces exam sensitivity and specificity. Bones/Joint/Cartilage Severe arthropathy of the first MTP joint with complete full-thickness cartilage loss, subchondral  sclerosis/cystic change, and bulky marginal osteophyte formation. Possible fragmentation of dorsal osteophyte of the base of the great toe proximal phalanx. Moderate-large first MTP joint effusion, nonspecific. Subchondral marrow edema on both sides of the joint may be degenerative/reactive although changes related to infection would have a similar appearance. Moderate degenerative changes across the tarsometatarsal joints. Bone marrow edema within the fourth and fifth metatarsal bases and to a lesser degree at the second metatarsal proximal diaphysis are favored to represent a combination of reactive/degenerative changes and stress-related changes. Moderate naviculocuneiform arthropathy. No malalignment. Ligaments Intact Lisfranc ligament.  First MTP joint capsular thickening. Muscles and Tendons Edema-like signal throughout the foot musculature. Focal intramuscular fluid collection within the adductor hallucis muscle adjacent to the first metatarsal diaphysis measuring 3.4 x 1.2 x 1.5 cm (series 5, image 31; series 9, images 13-15). No significant tenosynovial fluid collection. Soft tissues Generalized subcutaneous edema, most pronounced over the dorsum of the forefoot. Additional focal fluid collection adjacent to the medial margin of the first metatarsal neck measuring 1.7 x 0.6 x 1.5 cm. No ulceration is seen. IMPRESSION: 1. 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 Moderate-large first MTP joint effusion, nonspecific and may be degenerative/reactive, related to an underlying inflammatory arthropathy, or represent septic arthritis. 2. Marrow edema within the first metatarsal head and great toe proximal  phalanx could also be reactive/degenerative or secondary to osteomyelitis in the setting of infection. 3. Focal intramuscular fluid collection within the adductor hallucis muscle adjacent to the first metatarsal diaphysis measuring 3.4 x 1.2 x 1.5 cm. Findings are suspicious for intramuscular abscess. 4.  Additional probable abscess along the medial margin of the first metatarsal neck measuring up to 1.7 cm. 5. Bone marrow edema within the fourth and fifth metatarsal bases and to a lesser degree at the second metatarsal proximal diaphysis are favored to represent a combination of reactive/degenerative changes and stress-related changes. Electronically Signed   By: Duanne Guess D.O.   On: 07/14/2021 08:28   ? ? ?Assessment/Plan: ?MRSA bacteremia ?Left great toe abscess/septic arthritis ?Gone for surgery ?She also had low back pain we need to get MRI of the lumbar and thoracic spine ?repeat blood culture to look for clearance of bacteremia ?Will need  TEE ?Change vanco to dapto ?? ?Dermatomyositis- on cellcept and prednisone ?  ?HTN on amlodipine and losartan ?  ?Hypokalemia- being corrected ?  ?

## 2021-07-14 NOTE — Anesthesia Procedure Notes (Signed)
Procedure Name: LMA Insertion ?Date/Time: 07/14/2021 4:48 PM ?Performed by: Omer Jack, CRNA ?Pre-anesthesia Checklist: Patient identified, Patient being monitored, Timeout performed, Emergency Drugs available and Suction available ?Patient Re-evaluated:Patient Re-evaluated prior to induction ?Oxygen Delivery Method: Circle system utilized ?Preoxygenation: Pre-oxygenation with 100% oxygen ?Induction Type: IV induction ?Ventilation: Mask ventilation without difficulty ?LMA: LMA inserted ?LMA Size: 4.0 ?Tube type: Oral ?Number of attempts: 1 ?Placement Confirmation: positive ETCO2 and breath sounds checked- equal and bilateral ?Tube secured with: Tape ?Dental Injury: Teeth and Oropharynx as per pre-operative assessment  ? ? ? ? ?

## 2021-07-14 NOTE — Transfer of Care (Signed)
Immediate Anesthesia Transfer of Care Note ? ?Patient: Christy Ray ? ?Procedure(s) Performed: IRRIGATION AND DEBRIDEMENT WOUND (Left) ?JOINT RESECTION (Left) ? ?Patient Location: PACU ? ?Anesthesia Type:General ? ?Level of Consciousness: drowsy and patient cooperative ? ?Airway & Oxygen Therapy: Patient Spontanous Breathing ? ?Post-op Assessment: Report given to RN and Post -op Vital signs reviewed and stable ? ?Post vital signs: Reviewed and stable ? ?Last Vitals:  ?Vitals Value Taken Time  ?BP 116/83 07/14/21 1902  ?Temp 36.6 ?C 07/14/21 1902  ?Pulse 94 07/14/21 1902  ?Resp 16 07/14/21 1902  ?SpO2 94 % 07/14/21 1902  ? ? ?Last Pain:  ?Vitals:  ? 07/14/21 1902  ?TempSrc: Oral  ?PainSc:   ?   ? ?  ? ?Complications: No notable events documented. ?

## 2021-07-15 ENCOUNTER — Other Ambulatory Visit: Payer: Self-pay

## 2021-07-15 ENCOUNTER — Encounter: Payer: Self-pay | Admitting: Podiatry

## 2021-07-15 DIAGNOSIS — K6812 Psoas muscle abscess: Secondary | ICD-10-CM

## 2021-07-15 DIAGNOSIS — M009 Pyogenic arthritis, unspecified: Secondary | ICD-10-CM | POA: Diagnosis not present

## 2021-07-15 DIAGNOSIS — G062 Extradural and subdural abscess, unspecified: Secondary | ICD-10-CM

## 2021-07-15 DIAGNOSIS — M00072 Staphylococcal arthritis, left ankle and foot: Secondary | ICD-10-CM | POA: Diagnosis not present

## 2021-07-15 DIAGNOSIS — G061 Intraspinal abscess and granuloma: Secondary | ICD-10-CM | POA: Diagnosis not present

## 2021-07-15 DIAGNOSIS — R7881 Bacteremia: Secondary | ICD-10-CM | POA: Diagnosis not present

## 2021-07-15 DIAGNOSIS — L03116 Cellulitis of left lower limb: Secondary | ICD-10-CM | POA: Diagnosis not present

## 2021-07-15 LAB — CBC
HCT: 30.4 % — ABNORMAL LOW (ref 36.0–46.0)
Hemoglobin: 10.5 g/dL — ABNORMAL LOW (ref 12.0–15.0)
MCH: 28.2 pg (ref 26.0–34.0)
MCHC: 34.5 g/dL (ref 30.0–36.0)
MCV: 81.5 fL (ref 80.0–100.0)
Platelets: 189 10*3/uL (ref 150–400)
RBC: 3.73 MIL/uL — ABNORMAL LOW (ref 3.87–5.11)
RDW: 15.8 % — ABNORMAL HIGH (ref 11.5–15.5)
WBC: 24.7 10*3/uL — ABNORMAL HIGH (ref 4.0–10.5)
nRBC: 0 % (ref 0.0–0.2)

## 2021-07-15 LAB — BLOOD CULTURE ID PANEL (REFLEXED) - BCID2

## 2021-07-15 LAB — BASIC METABOLIC PANEL
Anion gap: 7 (ref 5–15)
BUN: 18 mg/dL (ref 8–23)
CO2: 26 mmol/L (ref 22–32)
Calcium: 8 mg/dL — ABNORMAL LOW (ref 8.9–10.3)
Chloride: 102 mmol/L (ref 98–111)
Creatinine, Ser: 0.5 mg/dL (ref 0.44–1.00)
GFR, Estimated: >60 mL/min (ref >60)
Glucose, Bld: 105 mg/dL — ABNORMAL HIGH (ref 70–99)
Potassium: 4.7 mmol/L (ref 3.5–5.1)
Sodium: 135 mmol/L (ref 135–145)

## 2021-07-15 LAB — CULTURE, BLOOD (SINGLE): Special Requests: ADEQUATE

## 2021-07-15 LAB — CK: Total CK: 50 U/L (ref 38–234)

## 2021-07-15 MED ORDER — SODIUM CHLORIDE 0.9 % IV SOLN
10.0000 mg/kg | Freq: Every day | INTRAVENOUS | Status: DC
  Administered 2021-07-16 – 2021-07-20 (×5): 600 mg via INTRAVENOUS
  Filled 2021-07-15 (×5): qty 12

## 2021-07-15 NOTE — Progress Notes (Signed)
Daily Progress Note   Subjective  - 1 Day Post-Op  Postop day #1 status post excision of metatarsophalangeal joint with antibiotic bead placement.  She states she is doing well.  Minimal pain  Objective Vitals:   07/14/21 1930 07/15/21 0018 07/15/21 0430 07/15/21 0903  BP: 127/85 (!) 132/94 133/82 124/80  Pulse: 87 86 89 92  Resp: 18 18 16 16   Temp: 97.6 F (36.4 C) 98 F (36.7 C) 98.1 F (36.7 C) 98.5 F (36.9 C)  TempSrc:    Oral  SpO2: 95% 97% 96% 97%  Weight:      Height:        Physical Exam: The incision looks really good today.  There is no stress to the wound.  Packing was removed and just bloody drainage was noted.  No purulence.  No lymphangitic streaking.  Erythema is stable.  Not severe in nature today.     Laboratory CBC    Component Value Date/Time   WBC 24.7 (H) 07/15/2021 0429   HGB 10.5 (L) 07/15/2021 0429   HCT 30.4 (L) 07/15/2021 0429   PLT 189 07/15/2021 0429    BMET    Component Value Date/Time   NA 135 07/15/2021 0429   K 4.7 07/15/2021 0429   CL 102 07/15/2021 0429   CO2 26 07/15/2021 0429   GLUCOSE 105 (H) 07/15/2021 0429   BUN 18 07/15/2021 0429   CREATININE 0.50 07/15/2021 0429   CALCIUM 8.0 (L) 07/15/2021 0429   GFRNONAA >60 07/15/2021 0429    Assessment/Planning: Status post resection first metatarsophalangeal joint with antibiotic bead placement. MRSA bacteremia Immunocompromise patient  Dressing changed today.  Wound looks really good.  No purulent drainage noted.  Awaiting intraoperative cultures.  Blood culture showed MRSA bacteremia.  Infectious diseases involved. Okay for weightbearing to her heel.  Try to minimize ambulation. We will continue to follow while in house.  07/17/2021 A  07/15/2021, 12:19 PM

## 2021-07-15 NOTE — Progress Notes (Signed)
Date of Admission:  07/12/2021     ID: ZELMA ZAHLER is a 66 y.o. 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, left (HCC)    Subjective: Patient says she is feeling better Back pain is better Left foot had surgery yesterday  Medications:   amLODipine  2.5 mg Oral Daily   enoxaparin (LOVENOX) injection  40 mg Subcutaneous [id number]   folic acid  1 mg Oral Daily   losartan  100 mg Oral Daily   And   hydrochlorothiazide  12.5 mg Oral Daily   hydrOXYzine  10 mg Oral QHS   rosuvastatin  10 mg Oral Daily   sodium chloride flush  3 mL Intravenous Q12H    Objective: Vital signs in last 24 hours: Temp:  [97.6 F (36.4 C)-98.6 F (37 C)] 98.5 F (36.9 C) (05/18 0903) Pulse Rate:  [79-127] 92 (05/18 0903) Resp:  [10-18] 16 (05/18 0903) BP: (109-150)/(77-94) 124/80 (05/18 0903) SpO2:  [94 %-100 %] 97 % (05/18 0903) Weight:  [60.3 kg] 60.3 kg (05/17 1628)  LDA None  PHYSICAL EXAM:  General: Alert, cooperative, no distress, appears stated age.  Head: Normocephalic, without obvious abnormality, atraumatic. Eyes: Conjunctivae clear, anicteric sclerae. Pupils are equal ENT Nares normal. No drainage or sinus tenderness. Lips, mucosa, and tongue normal. No Thrush Neck: Supple, symmetrical, no adenopathy, thyroid: non tender no carotid bruit and no JVD. Back: No CVA tenderness. Lungs: Clear to auscultation bilaterally. No Wheezing or Rhonchi. No rales. Heart: Regular rate and rhythm, no murmur, rub or gallop. Abdomen: Soft, non-tender,not distended. Bowel sounds normal. No masses Extremities: Left foot surgical dressing not removed   Skin: No rashes or lesions. Or bruising Lymph: Cervical, supraclavicular normal. Neurologic: Grossly non-focal  Lab Results Recent Labs    07/13/21 0259  07/14/21 0424 07/15/21 0429  WBC 26.2* 26.0* 24.7*  HGB 11.4* 12.4 10.5*  HCT 33.6* 35.1* 30.4*  NA 138  --  135  K 3.3* 3.5 4.7  CL 102  --  102  CO2 26  --  26  BUN 19  --  18  CREATININE 0.52  --  0.50    Microbiology: 07/12/2021 blood culture MRSA 07/14/2021 blood culture MRSA 07/14/2021 abscess culture Staph aureus Studies/Results: MR THORACIC SPINE W WO CONTRAST  Result Date: 07/14/2021 CLINICAL DATA:  Back pain and MRSA bacteremia EXAM: MRI THORACIC AND LUMBAR SPINE WITHOUT AND WITH CONTRAST TECHNIQUE: Multiplanar and multiecho pulse sequences of the thoracic and lumbar spine were obtained without and with intravenous contrast. CONTRAST:  74mL GADAVIST GADOBUTROL 1 MMOL/ML IV SOLN COMPARISON:  None Available. FINDINGS: MRI THORACIC SPINE FINDINGS Alignment:  Normal Vertebrae: No fracture, evidence of discitis, or bone lesion. Cord: Spinal cord is normal. However, there is a right is eccentric and dorsal epidural collection extending from T7-T12 with peripheral contrast enhancement. This causes mild attenuation of the thecal sac without spinal cord compression. There is a smaller component of the collection at the left ventral aspect of the T11 level. Paraspinal and other soft tissues: Negative Disc levels: No discogenic spinal canal stenosis. MRI LUMBAR SPINE FINDINGS Segmentation:  Standard. Alignment:  Grade 1 anterolisthesis at L3-4 Vertebrae: Mild edema at the inferior endplate of L1 is likely degenerative. Conus medullaris: The conus medullaris terminates at the L1-2 disc level.  There is mild abnormal contrast enhancement of the cauda equina. Within the dorsal epidural space at L3-5, there is a fluid collection measuring up to 7 mm in thickness. This severely crowds the cauda equina nerve roots in the spinal canal. Paraspinal and other soft tissues: Left psoas abscess measures 1.6 x 8.0 cm. There are small paraspinal abscesses on the left at the L4 level. Disc levels: T12-L1: Small disc  bulge without spinal canal stenosis. L1-2: Small disc bulge with endplate spurring.  No stenosis. L2-3: Normal. L3-4: Moderate facet hypertrophy and small disc bulge with moderate spinal canal stenosis. L4-5: Severe facet arthrosis and small disc bulge with moderate spinal canal stenosis. No neural foraminal stenosis. L5-S1: Severe facet arthrosis without spinal canal or neural foraminal stenosis. IMPRESSION: 1. 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 There is moderate attenuation of the thecal sac. 2. Left psoas abscess and small left posterior paraspinal muscle abscess. Electronically Signed   By: Ulyses Jarred M.D.   On: 07/14/2021 22:54   MR Lumbar Spine W Wo Contrast  Result Date: 07/14/2021 CLINICAL DATA:  Back pain and MRSA bacteremia EXAM: MRI THORACIC AND LUMBAR SPINE WITHOUT AND WITH CONTRAST TECHNIQUE: Multiplanar and multiecho pulse sequences of the thoracic and lumbar spine were obtained without and with intravenous contrast. CONTRAST:  23mL GADAVIST GADOBUTROL 1 MMOL/ML IV SOLN COMPARISON:  None Available. FINDINGS: MRI THORACIC SPINE FINDINGS Alignment:  Normal Vertebrae: No fracture, evidence of discitis, or bone lesion. Cord: Spinal cord is normal. However, there is a right is eccentric and dorsal epidural collection extending from T7-T12 with peripheral contrast enhancement. This causes mild attenuation of the thecal sac without spinal cord compression. There is a smaller component of the collection at the left ventral aspect of the T11 level. Paraspinal and other soft tissues: Negative Disc levels: No discogenic spinal canal stenosis. MRI LUMBAR SPINE FINDINGS Segmentation:  Standard. Alignment:  Grade 1 anterolisthesis at L3-4 Vertebrae: Mild edema at the inferior endplate of L1 is likely degenerative. Conus medullaris: The conus medullaris terminates at the L1-2 disc level. There is mild abnormal contrast enhancement of the cauda equina. Within the dorsal epidural space at L3-5,  there is a fluid collection measuring up to 7 mm in thickness. This severely crowds the cauda equina nerve roots in the spinal canal. Paraspinal and other soft tissues: Left psoas abscess measures 1.6 x 8.0 cm. There are small paraspinal abscesses on the left at the L4 level. Disc levels: T12-L1: Small disc bulge without spinal canal stenosis. L1-2: Small disc bulge with endplate spurring.  No stenosis. L2-3: Normal. L3-4: Moderate facet hypertrophy and small disc bulge with moderate spinal canal stenosis. L4-5: Severe facet arthrosis and small disc bulge with moderate spinal canal stenosis. No neural foraminal stenosis. L5-S1: Severe facet arthrosis without spinal canal or neural foraminal stenosis. IMPRESSION: 1. 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 There is moderate attenuation of the thecal sac. 2. Left psoas abscess and small left posterior paraspinal muscle abscess. Electronically Signed   By: Ulyses Jarred M.D.   On: 07/14/2021 22:54   MR FOOT LEFT WO CONTRAST  Result Date: 07/14/2021 CLINICAL DATA:  Foot swelling, diabetic, osteomyelitis suspected, xray done EXAM: MRI OF THE LEFT FOOT WITHOUT CONTRAST TECHNIQUE: Multiplanar, multisequence MR imaging of the left forefoot was performed. No intravenous contrast was administered. COMPARISON:  X-ray 07/12/2021 FINDINGS: Technical Note: Despite efforts by the technologist and patient, motion artifact is present on today's exam and could not be eliminated. This reduces exam sensitivity  and specificity. Bones/Joint/Cartilage Severe arthropathy of the first MTP joint with complete full-thickness cartilage loss, subchondral sclerosis/cystic change, and bulky marginal osteophyte formation. Possible fragmentation of dorsal osteophyte of the base of the great toe proximal phalanx. Moderate-large first MTP joint effusion, nonspecific. Subchondral marrow edema on both sides of the joint may be degenerative/reactive although changes related to infection  would have a similar appearance. Moderate degenerative changes across the tarsometatarsal joints. Bone marrow edema within the fourth and fifth metatarsal bases and to a lesser degree at the second metatarsal proximal diaphysis are favored to represent a combination of reactive/degenerative changes and stress-related changes. Moderate naviculocuneiform arthropathy. No malalignment. Ligaments Intact Lisfranc ligament.  First MTP joint capsular thickening. Muscles and Tendons Edema-like signal throughout the foot musculature. Focal intramuscular fluid collection within the adductor hallucis muscle adjacent to the first metatarsal diaphysis measuring 3.4 x 1.2 x 1.5 cm (series 5, image 31; series 9, images 13-15). No significant tenosynovial fluid collection. Soft tissues Generalized subcutaneous edema, most pronounced over the dorsum of the forefoot. Additional focal fluid collection adjacent to the medial margin of the first metatarsal neck measuring 1.7 x 0.6 x 1.5 cm. No ulceration is seen. IMPRESSION: 1. 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 Moderate-large first MTP joint effusion, nonspecific and may be degenerative/reactive, related to an underlying inflammatory arthropathy, or represent septic arthritis. 2. Marrow edema within the first metatarsal head and great toe proximal phalanx could also be reactive/degenerative or secondary to osteomyelitis in the setting of infection. 3. Focal intramuscular fluid collection within the adductor hallucis muscle adjacent to the first metatarsal diaphysis measuring 3.4 x 1.2 x 1.5 cm. Findings are suspicious for intramuscular abscess. 4. Additional probable abscess along the medial margin of the first metatarsal neck measuring up to 1.7 cm. 5. Bone marrow edema within the fourth and fifth metatarsal bases and to a lesser degree at the second metatarsal proximal diaphysis are favored to represent a combination of reactive/degenerative changes and stress-related changes.  Electronically Signed   By: Davina Poke D.O.   On: 07/14/2021 08:28   DG Foot Complete Left  Result Date: 07/14/2021 CLINICAL DATA:  Postoperative check. EXAM: LEFT FOOT - COMPLETE 3+ VIEW COMPARISON:  Jul 12, 2021. FINDINGS: Status post osteotomy of distal first metatarsal as well as a portion of the first proximal phalanx. Surgically placed beads are noted in the region of the first metatarsophalangeal joint. IMPRESSION: Postsurgical changes as described above. Electronically Signed   By: Marijo Conception M.D.   On: 07/14/2021 18:37   ECHOCARDIOGRAM COMPLETE  Result Date: 07/14/2021    ECHOCARDIOGRAM REPORT   Patient Name:   SAYANI SULKOWSKI Date of Exam: 07/14/2021 Medical Rec #:  [id number]       Height:       65.0 in Accession #:    [id number]      Weight:       133.0 lb Date of Birth:  04/07/1955       BSA:          1.663 m Patient Age:    30 years        BP:           168/91 mmHg Patient Gender: F               HR:           102 bpm. Exam Location:  [facility] Procedure: 2D Echo, Cardiac Doppler and Color Doppler Indications:     Bacteremia R78.81  History:  Patient has no prior history of Echocardiogram examinations.                  Risk Factors:Hypertension.  Sonographer:     Sherrie Sport Referring Phys:  [id number] Jennye Boroughs Diagnosing Phys: Yolonda Kida MD IMPRESSIONS  1. Left ventricular ejection fraction, by estimation, is >75%. The left ventricle has hyperdynamic function. The left ventricle has no regional wall motion abnormalities. Left ventricular diastolic parameters are consistent with Grade I diastolic dysfunction (impaired relaxation).  2. Right ventricular systolic function is normal. The right ventricular size is normal.  3. The mitral valve is normal in structure. No evidence of mitral valve regurgitation.  4. The aortic valve is grossly normal. Aortic valve regurgitation is not visualized. FINDINGS  Left Ventricle: Left ventricular ejection fraction, by estimation, is >75%. The  left ventricle has hyperdynamic function. The left ventricle has no regional wall motion abnormalities. The left ventricular internal cavity size was normal in size. There is no left ventricular hypertrophy. Left ventricular diastolic parameters are consistent with Grade I diastolic dysfunction (impaired relaxation). Right Ventricle: The right ventricular size is normal. No increase in right ventricular wall thickness. Right ventricular systolic function is normal. Left Atrium: Left atrial size was normal in size. Right Atrium: Right atrial size was normal in size. Pericardium: There is no evidence of pericardial effusion. Mitral Valve: The mitral valve is normal in structure. No evidence of mitral valve regurgitation. MV peak gradient, 4.7 mmHg. The mean mitral valve gradient is 2.0 mmHg. Tricuspid Valve: The tricuspid valve is normal in structure. Tricuspid valve regurgitation is not demonstrated. Aortic Valve: The aortic valve is grossly normal. Aortic valve regurgitation is not visualized. Aortic regurgitation PHT measures 471 msec. Aortic valve mean gradient measures 5.0 mmHg. Aortic valve peak gradient measures 11.0 mmHg. Aortic valve area, by  VTI measures 2.94 cm. Pulmonic Valve: The pulmonic valve was normal in structure. Pulmonic valve regurgitation is not visualized. Aorta: The ascending aorta was not well visualized. IAS/Shunts: No atrial level shunt detected by color flow Doppler.  LEFT VENTRICLE PLAX 2D LVIDd:         4.70 cm   Diastology LVIDs:         2.60 cm   LV e' medial:    6.31 cm/s LV PW:         1.10 cm   LV E/e' medial:  9.2 LV IVS:        0.80 cm   LV e' lateral:   10.90 cm/s LVOT diam:     2.00 cm   LV E/e' lateral: 5.3 LV SV:         77 LV SV Index:   46 LVOT Area:     3.14 cm  RIGHT VENTRICLE RV Basal diam:  3.60 cm RV S prime:     21.40 cm/s TAPSE (M-mode): 2.6 cm LEFT ATRIUM             Index        RIGHT ATRIUM           Index LA diam:        3.10 cm 1.86 cm/m   RA Area:     17.80  cm LA Vol (A2C):   62.1 ml 37.34 ml/m  RA Volume:   47.60 ml  28.62 ml/m LA Vol (A4C):   24.6 ml 14.79 ml/m LA Biplane Vol: 41.0 ml 24.65 ml/m  AORTIC VALVE AV Area (Vmax):    2.42 cm AV Area (Vmean):  3.02 cm AV Area (VTI):     2.94 cm AV Vmax:           166.00 cm/s AV Vmean:          101.000 cm/s AV VTI:            0.261 m AV Peak Grad:      11.0 mmHg AV Mean Grad:      5.0 mmHg LVOT Vmax:         128.00 cm/s LVOT Vmean:        97.000 cm/s LVOT VTI:          0.244 m LVOT/AV VTI ratio: 0.93 AI PHT:            471 msec  AORTA Ao Root diam: 3.20 cm MITRAL VALVE                TRICUSPID VALVE MV Area (PHT): 6.37 cm     TR Peak grad:   22.3 mmHg MV Area VTI:   4.53 cm     TR Vmax:        236.00 cm/s MV Peak grad:  4.7 mmHg MV Mean grad:  2.0 mmHg     SHUNTS MV Vmax:       1.08 m/s     Systemic VTI:  0.24 m MV Vmean:      67.7 cm/s    Systemic Diam: 2.00 cm MV Decel Time: 119 msec MV E velocity: 57.80 cm/s MV A velocity: 115.00 cm/s MV E/A ratio:  0.50 Dwayne D Callwood MD Electronically signed by Yolonda Kida MD Signature Date/Time: 07/14/2021/1:52:55 PM    Final      Assessment/Plan: Disseminated MRSA infection. MRSA bacteremia MRSA septic arthritis of the left great toe underwent irrigation and debridement and first metatarsal phalangeal joint resection.  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  Repeat blood culture has been positive We will have to repeat cultures until clearance of bacteremia.  The next blood culture will be after drainage of all these abscesses Patient is currently on daptomycin 8 mg/kg body weight.  We will increase it to 10 mg/kg body weight.  Patient is immunocompromised due to CellCept and prednisone she takes for dermatomyositis.  Will stepdown  hold  Hypertension on amlodipine and losartan  Hyperlipidemia on rosuvastatin 10 mg Watch closely CPK as risk for rhabdomyolysis as she is on daptomycin.  Discussed the management with the patient and care team.

## 2021-07-15 NOTE — Progress Notes (Signed)
PHARMACY - PHYSICIAN COMMUNICATION CRITICAL VALUE ALERT - BLOOD CULTURE IDENTIFICATION (BCID)  BCID:  1 (anaerobic) of 4 bottles w/ MRSA (mecA/C and MREJ detected).  Pt was initially on vancomycin, but transitioned to daptomycin by Dr. Rivka Safer.  Name of provider contacted: Docia Furl, NP  Changes to prescribed antibiotics required: Pt being followed by ID provider, so no changes at this time.  Otelia Sergeant, PharmD, [facility] 07/15/2021 5:22 AM

## 2021-07-15 NOTE — Progress Notes (Signed)
Spoke with the patient She would like a 4 prong cane , [employer] will deliver She will have a PICC line and need Home IV ABX Pam with [facility] is aware, Barbara Cower with [facility] has accepted the patient for PICC line care and drawing labs The patient lives at home with her husband and he will provide transportation

## 2021-07-15 NOTE — Consult Note (Signed)
Chief Complaint: Patient was seen in consultation today for  Chief Complaint  Patient presents with   Back Pain   Foot Pain    Referring Physician(s): Dr. Roosevelt Locks  Supervising Physician: Juliet Rude  Patient Status: [place] - In-pt  History of Present Illness: Christy Ray is a 66 y.o. female with a medical history significant for dermatomyositis and polyarthritis. She presented to the [facility] ED 07/12/21 with complaints of back pain and left foot pain. Her left great toe was red and swollen and findings were consistent with cellulitis/sepsis. Blood cultures were positive for MRSA bacteremia. She was taken to the OR with podiatry 07/14/21 for a left first metatarsal joint resection, left foot I&D and application of antibiotic beads.   MR imaging was ordered for evaluation of her back pain.   MR Lumbar Spine 07/14/21 Paraspinal and other soft tissues: Left psoas abscess measures 1.6 x 8.0 cm. There are small paraspinal abscesses on the left at the L4 level. IMPRESSION: 1. 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 There is moderate attenuation of the thecal sac. 2. Left psoas abscess and small left posterior paraspinal muscle abscess.  Interventional Radiology has been asked to evaluate this patient for an image-guided left psoas fluid collection aspiration with possible drain placement. Imaging reviewed and procedure approved by Dr. Denna Haggard.   Past Medical History:  Diagnosis Date   Dermatomyositis ([place])    Polyarthritis     Past Surgical History:  Procedure Laterality Date   INCISION AND DRAINAGE OF WOUND Left 07/14/2021   Procedure: IRRIGATION AND DEBRIDEMENT WOUND;  Surgeon: Caroline More, DPM;  Location: [facility] ORS;  Service: Podiatry;  Laterality: Left;   OSTECTOMY Left 07/14/2021   Procedure: JOINT RESECTION;  Surgeon: Caroline More, DPM;  Location: [facility] ORS;  Service: Podiatry;  Laterality: Left;    Allergies: Patient has no known  allergies.  Medications: Prior to Admission medications   Medication Sig Start Date End Date Taking? Authorizing Provider  amLODipine (NORVASC) 2.5 MG tablet Take 2.5 mg by mouth daily. 06/07/21  Yes [provider]  folic acid (FOLVITE) 1 MG tablet Take 1 mg by mouth daily. 05/28/21  Yes [provider]  hydrOXYzine (ATARAX) 10 MG tablet Take 10 mg by mouth at bedtime. 05/17/21  Yes [provider]  losartan-hydrochlorothiazide (HYZAAR) 100-12.5 MG tablet Take 1 tablet by mouth daily. 05/03/21  Yes [provider]  mycophenolate (CELLCEPT) 500 MG tablet Take 1,000 mg by mouth 2 (two) times daily. 06/14/21  Yes [provider]  predniSONE (DELTASONE) 10 MG tablet Take 40 mg by mouth daily. 07/07/21  Yes [provider]  rosuvastatin (CRESTOR) 10 MG tablet Take 10 mg by mouth daily. 05/11/21  Yes [provider]  methotrexate (RHEUMATREX) 2.5 MG tablet Take 20 mg by mouth once a week. Patient not taking: Reported on 07/12/2021 02/20/21   [provider]     History reviewed. No pertinent family history.  Social History   Socioeconomic History   Marital status: Married    Spouse name: Not on file   Number of children: Not on file   Years of education: Not on file   Highest education level: Not on file  Occupational History   Not on file  Tobacco Use   Smoking status: Every Day    Packs/day: 0.50    Types: Cigarettes   Smokeless tobacco: Never  Substance and Sexual Activity   Alcohol use: No   Drug use: Never   Sexual activity:  Not on file  Other Topics Concern   Not on file  Social History Narrative   Not on file   Social Determinants of Health   Financial Resource Strain: Not on file  Food Insecurity: Not on file  Transportation Needs: Not on file  Physical Activity: Not on file  Stress: Not on file  Social Connections: Not on file    Review of Systems: A 12 point ROS discussed and pertinent positives  are indicated in the HPI above.  All other systems are negative.  Review of Systems  Constitutional:  Positive for fatigue. Negative for appetite change.  Respiratory:  Negative for cough and shortness of breath.   Cardiovascular:  Negative for chest pain and leg swelling.  Gastrointestinal:  Positive for nausea. Negative for diarrhea and vomiting.  Musculoskeletal:  Positive for back pain and joint swelling.  Skin:  Positive for wound.       Left foot  Neurological:  Negative for dizziness and headaches.   Vital Signs: BP 124/80 (BP Location: Left Arm)   Pulse 92   Temp 98.5 F (36.9 C) (Oral)   Resp 16   Ht 5\' 5"  (1.651 m)   Wt 132 lb 15 oz (60.3 kg)   SpO2 97%   BMI 22.12 kg/m   Physical Exam Constitutional:      General: She is not in acute distress.    Appearance: She is not ill-appearing.  HENT:     Mouth/Throat:     Mouth: Mucous membranes are moist.     Pharynx: Oropharynx is clear.  Cardiovascular:     Rate and Rhythm: Normal rate.  Pulmonary:     Effort: Pulmonary effort is normal.  Musculoskeletal:     Comments: Left foot wrapped in post-op surgical dressing   Neurological:     Mental Status: She is alert and oriented to person, place, and time.    Imaging: MR THORACIC SPINE W WO CONTRAST  Result Date: 07/14/2021 CLINICAL DATA:  Back pain and MRSA bacteremia EXAM: MRI THORACIC AND LUMBAR SPINE WITHOUT AND WITH CONTRAST TECHNIQUE: Multiplanar and multiecho pulse sequences of the thoracic and lumbar spine were obtained without and with intravenous contrast. CONTRAST:  45mL GADAVIST GADOBUTROL 1 MMOL/ML IV SOLN COMPARISON:  None Available. FINDINGS: MRI THORACIC SPINE FINDINGS Alignment:  Normal Vertebrae: No fracture, evidence of discitis, or bone lesion. Cord: Spinal cord is normal. However, there is a right is eccentric and dorsal epidural collection extending from T7-T12 with peripheral contrast enhancement. This causes mild attenuation of the thecal sac  without spinal cord compression. There is a smaller component of the collection at the left ventral aspect of the T11 level. Paraspinal and other soft tissues: Negative Disc levels: No discogenic spinal canal stenosis. MRI LUMBAR SPINE FINDINGS Segmentation:  Standard. Alignment:  Grade 1 anterolisthesis at L3-4 Vertebrae: Mild edema at the inferior endplate of L1 is likely degenerative. Conus medullaris: The conus medullaris terminates at the L1-2 disc level. There is mild abnormal contrast enhancement of the cauda equina. Within the dorsal epidural space at L3-5, there is a fluid collection measuring up to 7 mm in thickness. This severely crowds the cauda equina nerve roots in the spinal canal. Paraspinal and other soft tissues: Left psoas abscess measures 1.6 x 8.0 cm. There are small paraspinal abscesses on the left at the L4 level. Disc levels: T12-L1: Small disc bulge without spinal canal stenosis. L1-2: Small disc bulge with endplate spurring.  No stenosis. L2-3: Normal. L3-4: Moderate facet hypertrophy  and small disc bulge with moderate spinal canal stenosis. L4-5: Severe facet arthrosis and small disc bulge with moderate spinal canal stenosis. No neural foraminal stenosis. L5-S1: Severe facet arthrosis without spinal canal or neural foraminal stenosis. IMPRESSION: 1. 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 There is moderate attenuation of the thecal sac. 2. Left psoas abscess and small left posterior paraspinal muscle abscess. Electronically Signed   By: Ulyses Jarred M.D.   On: 07/14/2021 22:54   MR Lumbar Spine W Wo Contrast  Result Date: 07/14/2021 CLINICAL DATA:  Back pain and MRSA bacteremia EXAM: MRI THORACIC AND LUMBAR SPINE WITHOUT AND WITH CONTRAST TECHNIQUE: Multiplanar and multiecho pulse sequences of the thoracic and lumbar spine were obtained without and with intravenous contrast. CONTRAST:  32mL GADAVIST GADOBUTROL 1 MMOL/ML IV SOLN COMPARISON:  None Available. FINDINGS: MRI  THORACIC SPINE FINDINGS Alignment:  Normal Vertebrae: No fracture, evidence of discitis, or bone lesion. Cord: Spinal cord is normal. However, there is a right is eccentric and dorsal epidural collection extending from T7-T12 with peripheral contrast enhancement. This causes mild attenuation of the thecal sac without spinal cord compression. There is a smaller component of the collection at the left ventral aspect of the T11 level. Paraspinal and other soft tissues: Negative Disc levels: No discogenic spinal canal stenosis. MRI LUMBAR SPINE FINDINGS Segmentation:  Standard. Alignment:  Grade 1 anterolisthesis at L3-4 Vertebrae: Mild edema at the inferior endplate of L1 is likely degenerative. Conus medullaris: The conus medullaris terminates at the L1-2 disc level. There is mild abnormal contrast enhancement of the cauda equina. Within the dorsal epidural space at L3-5, there is a fluid collection measuring up to 7 mm in thickness. This severely crowds the cauda equina nerve roots in the spinal canal. Paraspinal and other soft tissues: Left psoas abscess measures 1.6 x 8.0 cm. There are small paraspinal abscesses on the left at the L4 level. Disc levels: T12-L1: Small disc bulge without spinal canal stenosis. L1-2: Small disc bulge with endplate spurring.  No stenosis. L2-3: Normal. L3-4: Moderate facet hypertrophy and small disc bulge with moderate spinal canal stenosis. L4-5: Severe facet arthrosis and small disc bulge with moderate spinal canal stenosis. No neural foraminal stenosis. L5-S1: Severe facet arthrosis without spinal canal or neural foraminal stenosis. IMPRESSION: 1. 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 There is moderate attenuation of the thecal sac. 2. Left psoas abscess and small left posterior paraspinal muscle abscess. Electronically Signed   By: Ulyses Jarred M.D.   On: 07/14/2021 22:54   MR FOOT LEFT WO CONTRAST  Result Date: 07/14/2021 CLINICAL DATA:  Foot swelling,  diabetic, osteomyelitis suspected, xray done EXAM: MRI OF THE LEFT FOOT WITHOUT CONTRAST TECHNIQUE: Multiplanar, multisequence MR imaging of the left forefoot was performed. No intravenous contrast was administered. COMPARISON:  X-ray 07/12/2021 FINDINGS: Technical Note: Despite efforts by the technologist and patient, motion artifact is present on today's exam and could not be eliminated. This reduces exam sensitivity and specificity. Bones/Joint/Cartilage Severe arthropathy of the first MTP joint with complete full-thickness cartilage loss, subchondral sclerosis/cystic change, and bulky marginal osteophyte formation. Possible fragmentation of dorsal osteophyte of the base of the great toe proximal phalanx. Moderate-large first MTP joint effusion, nonspecific. Subchondral marrow edema on both sides of the joint may be degenerative/reactive although changes related to infection would have a similar appearance. Moderate degenerative changes across the tarsometatarsal joints. Bone marrow edema within the fourth and fifth metatarsal bases and to a lesser degree at the second metatarsal proximal  diaphysis are favored to represent a combination of reactive/degenerative changes and stress-related changes. Moderate naviculocuneiform arthropathy. No malalignment. Ligaments Intact Lisfranc ligament.  First MTP joint capsular thickening. Muscles and Tendons Edema-like signal throughout the foot musculature. Focal intramuscular fluid collection within the adductor hallucis muscle adjacent to the first metatarsal diaphysis measuring 3.4 x 1.2 x 1.5 cm (series 5, image 31; series 9, images 13-15). No significant tenosynovial fluid collection. Soft tissues Generalized subcutaneous edema, most pronounced over the dorsum of the forefoot. Additional focal fluid collection adjacent to the medial margin of the first metatarsal neck measuring 1.7 x 0.6 x 1.5 cm. No ulceration is seen. IMPRESSION: 1. Severe arthropathy of the first MTP  joint. Moderate-large first MTP joint effusion, nonspecific and may be degenerative/reactive, related to an underlying inflammatory arthropathy, or represent septic arthritis. 2. Marrow edema within the first metatarsal head and great toe proximal phalanx could also be reactive/degenerative or secondary to osteomyelitis in the setting of infection. 3. Focal intramuscular fluid collection within the adductor hallucis muscle adjacent to the first metatarsal diaphysis measuring 3.4 x 1.2 x 1.5 cm. Findings are suspicious for intramuscular abscess. 4. Additional probable abscess along the medial margin of the first metatarsal neck measuring up to 1.7 cm. 5. Bone marrow edema within the fourth and fifth metatarsal bases and to a lesser degree at the second metatarsal proximal diaphysis are favored to represent a combination of reactive/degenerative changes and stress-related changes. Electronically Signed   By: Davina Poke D.O.   On: 07/14/2021 08:28   DG Foot Complete Left  Result Date: 07/14/2021 CLINICAL DATA:  Postoperative check. EXAM: LEFT FOOT - COMPLETE 3+ VIEW COMPARISON:  Jul 12, 2021. FINDINGS: Status post osteotomy of distal first metatarsal as well as a portion of the first proximal phalanx. Surgically placed beads are noted in the region of the first metatarsophalangeal joint. IMPRESSION: Postsurgical changes as described above. Electronically Signed   By: Marijo Conception M.D.   On: 07/14/2021 18:37   DG Foot Complete Left  Result Date: 07/12/2021 CLINICAL DATA:  Left foot pain with erythema and swelling of the MCP joint for 4-5 days. Question gout. EXAM: LEFT FOOT - COMPLETE 3+ VIEW COMPARISON:  None Available. FINDINGS: The mineralization and alignment are normal. There is no evidence of acute fracture or dislocation. 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 No erosive changes or soft tissue calcifications are identified. Mild  midfoot degenerative changes. There is mild forefoot soft tissue swelling. IMPRESSION: No acute osseous findings or specific radiographic findings of gout. Advanced degenerative changes at the 1st metatarsophalangeal joint with mild nonspecific forefoot soft tissue swelling. Electronically Signed   By: Richardean Sale M.D.   On: 07/12/2021 09:39   ECHOCARDIOGRAM COMPLETE  Result Date: 07/14/2021    ECHOCARDIOGRAM REPORT   Patient Name:   ZAHRA BISSELL Date of Exam: 07/14/2021 Medical Rec #:  [id number]       Height:       65.0 in Accession #:    [id number]      Weight:       133.0 lb Date of Birth:  [birth year]       BSA:          1.663 m Patient Age:    89 years        BP:           168/91 mmHg Patient Gender: F  HR:           102 bpm. Exam Location:  [facility] Procedure: 2D Echo, Cardiac Doppler and Color Doppler Indications:     Bacteremia R78.81  History:         Patient has no prior history of Echocardiogram examinations.                  Risk Factors:Hypertension.  Sonographer:     Sherrie Sport Referring Phys:  [id number] Jennye Boroughs Diagnosing Phys: Yolonda Kida MD IMPRESSIONS  1. Left ventricular ejection fraction, by estimation, is >75%. The left ventricle has hyperdynamic function. The left ventricle has no regional wall motion abnormalities. Left ventricular diastolic parameters are consistent with Grade I diastolic dysfunction (impaired relaxation).  2. Right ventricular systolic function is normal. The right ventricular size is normal.  3. The mitral valve is normal in structure. No evidence of mitral valve regurgitation.  4. The aortic valve is grossly normal. Aortic valve regurgitation is not visualized. FINDINGS  Left Ventricle: Left ventricular ejection fraction, by estimation, is >75%. The left ventricle has hyperdynamic function. The left ventricle has no regional wall motion abnormalities. The left ventricular internal cavity size was normal in size. There is no left ventricular  hypertrophy. Left ventricular diastolic parameters are consistent with Grade I diastolic dysfunction (impaired relaxation). Right Ventricle: The right ventricular size is normal. No increase in right ventricular wall thickness. Right ventricular systolic function is normal. Left Atrium: Left atrial size was normal in size. Right Atrium: Right atrial size was normal in size. Pericardium: There is no evidence of pericardial effusion. Mitral Valve: The mitral valve is normal in structure. No evidence of mitral valve regurgitation. MV peak gradient, 4.7 mmHg. The mean mitral valve gradient is 2.0 mmHg. Tricuspid Valve: The tricuspid valve is normal in structure. Tricuspid valve regurgitation is not demonstrated. Aortic Valve: The aortic valve is grossly normal. Aortic valve regurgitation is not visualized. Aortic regurgitation PHT measures 471 msec. Aortic valve mean gradient measures 5.0 mmHg. Aortic valve peak gradient measures 11.0 mmHg. Aortic valve area, by  VTI measures 2.94 cm. Pulmonic Valve: The pulmonic valve was normal in structure. Pulmonic valve regurgitation is not visualized. Aorta: The ascending aorta was not well visualized. IAS/Shunts: No atrial level shunt detected by color flow Doppler.  LEFT VENTRICLE PLAX 2D LVIDd:         4.70 cm   Diastology LVIDs:         2.60 cm   LV e' medial:    6.31 cm/s LV PW:         1.10 cm   LV E/e' medial:  9.2 LV IVS:        0.80 cm   LV e' lateral:   10.90 cm/s LVOT diam:     2.00 cm   LV E/e' lateral: 5.3 LV SV:         77 LV SV Index:   46 LVOT Area:     3.14 cm  RIGHT VENTRICLE RV Basal diam:  3.60 cm RV S prime:     21.40 cm/s TAPSE (M-mode): 2.6 cm LEFT ATRIUM             Index        RIGHT ATRIUM           Index LA diam:        3.10 cm 1.86 cm/m   RA Area:     17.80 cm LA Vol (A2C):   62.1 ml 37.34  ml/m  RA Volume:   47.60 ml  28.62 ml/m LA Vol (A4C):   24.6 ml 14.79 ml/m LA Biplane Vol: 41.0 ml 24.65 ml/m  AORTIC VALVE AV Area (Vmax):    2.42 cm AV  Area (Vmean):   3.02 cm AV Area (VTI):     2.94 cm AV Vmax:           166.00 cm/s AV Vmean:          101.000 cm/s AV VTI:            0.261 m AV Peak Grad:      11.0 mmHg AV Mean Grad:      5.0 mmHg LVOT Vmax:         128.00 cm/s LVOT Vmean:        97.000 cm/s LVOT VTI:          0.244 m LVOT/AV VTI ratio: 0.93 AI PHT:            471 msec  AORTA Ao Root diam: 3.20 cm MITRAL VALVE                TRICUSPID VALVE MV Area (PHT): 6.37 cm     TR Peak grad:   22.3 mmHg MV Area VTI:   4.53 cm     TR Vmax:        236.00 cm/s MV Peak grad:  4.7 mmHg MV Mean grad:  2.0 mmHg     SHUNTS MV Vmax:       1.08 m/s     Systemic VTI:  0.24 m MV Vmean:      67.7 cm/s    Systemic Diam: 2.00 cm MV Decel Time: 119 msec MV E velocity: 57.80 cm/s MV A velocity: 115.00 cm/s MV E/A ratio:  0.50 Dwayne D Callwood MD Electronically signed by Yolonda Kida MD Signature Date/Time: 07/14/2021/1:52:55 PM    Final     Labs:  CBC: Recent Labs    07/12/21 0850 07/13/21 0259 07/14/21 0424 07/15/21 0429  WBC 23.6* 26.2* 26.0* 24.7*  HGB 13.6 11.4* 12.4 10.5*  HCT 40.1 33.6* 35.1* 30.4*  PLT 177 166 175 189    COAGS: No results for input(s): INR, APTT in the last 8760 hours.  BMP: Recent Labs    07/12/21 0850 07/13/21 0259 07/14/21 0424 07/15/21 0429  NA 137 138  --  135  K 2.6* 3.3* 3.5 4.7  CL 99 102  --  102  CO2 26 26  --  26  GLUCOSE 117* 110*  --  105*  BUN 28* 19  --  18  CALCIUM 9.0 8.7*  --  8.0*  CREATININE 0.69 0.52  --  0.50  GFRNONAA >60 >60  --  >60    LIVER FUNCTION TESTS: Recent Labs    07/12/21 0850  BILITOT 1.5*  AST 20  ALT 39  ALKPHOS 384*  PROT 6.9  ALBUMIN 3.0*    TUMOR MARKERS: No results for input(s): AFPTM, CEA, CA199, CHROMGRNA in the last 8760 hours.  Assessment and Plan:  MRSA Bacteremia; left psoas fluid collection: Lysbeth Galas. Mauri, 66 year old female, is tentatively scheduled 07/16/21 for an image-guided left psoas fluid collection aspiration with possible drain  placement.  Risks and benefits discussed with the patient including bleeding, infection, damage to adjacent structures, bowel perforation/fistula connection, and sepsis.  All of the patient's questions were answered, patient is agreeable to proceed. She will be NPO at midnight. Morning lab orders are in place. Lovenox will  be held.   Consent signed and in chart.  Thank you for this interesting consult.  I greatly enjoyed meeting LEILIANA GONSER and look forward to participating in their care.  A copy of this report was sent to the requesting provider on this date.  Electronically Signed: Milynn Yothers, AGACNP-BC [phone] 07/15/2021, 3:04 PM   I spent a total of 20 Minutes    in face to face in clinical consultation, greater than 50% of which was counseling/coordinating care for left psoas abscess aspiration with possible drain placement.

## 2021-07-15 NOTE — H&P (View-Only) (Signed)
Referring Physician:  No referring provider defined for this encounter.  Primary Physician:  Ethelda Chick, MD  Chief Complaint:  low back pain, left toe pain  History of Present Illness: 07/15/2021 Christy Ray is a 66 y.o. female who presents with the chief complaint of left great toe pain.  She also had back pain over the past few weeks.  She was brought into the emergency department by her husband on Tuesday, where work-up ultimately identified septic arthritis of her left great toe as well as thoracic and lumbar epidural abscesses.  She was also found to have a left psoas abscess.  She has been found to have methicillin resistant Staphylococcus aureus.  This was found on positive blood cultures.  Of note, she reports continued back pain but no evidence of radicular pain or weakness in her legs other than discomfort with hip flexion on the left side  Due to the presence of MRSA bacteremia and epidural abscess, I was asked to evaluate for possible drainage of her epidural component to decrease her burden of bacterial disease  Review of Systems:  A 10 point review of systems is negative, except for the pertinent positives and negatives detailed in the HPI.  Past Medical History: Past Medical History:  Diagnosis Date   Dermatomyositis (HCC)    Polyarthritis     Past Surgical History: Past Surgical History:  Procedure Laterality Date   INCISION AND DRAINAGE OF WOUND Left 07/14/2021   Procedure: IRRIGATION AND DEBRIDEMENT WOUND;  Surgeon: Rosetta Posner, DPM;  Location: [facility] ORS;  Service: Podiatry;  Laterality: Left;   OSTECTOMY Left 07/14/2021   Procedure: JOINT RESECTION;  Surgeon: Rosetta Posner, DPM;  Location: [facility] ORS;  Service: Podiatry;  Laterality: Left;    Allergies: Allergies as of 07/12/2021   (No Known Allergies)    Medications:  Current Facility-Administered Medications:    0.9 %  sodium chloride infusion, 250 mL, Intravenous, PRN, Rosetta Posner, DPM, Last  Rate: 10 mL/hr at 07/13/21 0124, 250 mL at 07/13/21 0124   acetaminophen (TYLENOL) tablet 650 mg, 650 mg, Oral, Q6H PRN, Rosetta Posner, DPM   amLODipine (NORVASC) tablet 2.5 mg, 2.5 mg, Oral, Daily, Rosetta Posner, DPM, 2.5 mg at 07/15/21 1008   cyclobenzaprine (FLEXERIL) tablet 5 mg, 5 mg, Oral, TID PRN, Rosetta Posner, DPM, 5 mg at 07/15/21 1009   DAPTOmycin (CUBICIN) 500 mg in sodium chloride 0.9 % IVPB, 8 mg/kg, Intravenous, Q2000, Rosetta Posner, DPM, Last Rate: 120 mL/hr at 07/15/21 0135, 500 mg at 07/15/21 0135   enoxaparin (LOVENOX) injection 40 mg, 40 mg, Subcutaneous, Q24H, Rosetta Posner, DPM   folic acid (FOLVITE) tablet 1 mg, 1 mg, Oral, Daily, Rosetta Posner, DPM, 1 mg at 07/15/21 1009   losartan (COZAAR) tablet 100 mg, 100 mg, Oral, Daily, 100 mg at 07/15/21 1009 **AND** hydrochlorothiazide (HYDRODIURIL) tablet 12.5 mg, 12.5 mg, Oral, Daily, Rosetta Posner, DPM, 12.5 mg at 07/15/21 1009   hydrOXYzine (ATARAX) tablet 10 mg, 10 mg, Oral, QHS, Rosetta Posner, DPM, 10 mg at 07/14/21 2234   morphine (PF) 4 MG/ML injection 4 mg, 4 mg, Intravenous, Q4H PRN, Rosetta Posner, DPM, 4 mg at 07/14/21 1603   ondansetron (ZOFRAN) tablet 4 mg, 4 mg, Oral, Q6H PRN **OR** ondansetron (ZOFRAN) injection 4 mg, 4 mg, Intravenous, Q6H PRN, Rosetta Posner, DPM, 4 mg at 07/14/21 1358   oxyCODONE (Oxy IR/ROXICODONE) immediate release tablet 5 mg, 5 mg, Oral, Q4H PRN, Rosetta Posner, DPM, 5 mg at 07/14/21 2239   rosuvastatin (CRESTOR)  tablet 10 mg, 10 mg, Oral, Daily, Rosetta Posner, DPM, 10 mg at 07/15/21 1009   sodium chloride flush (NS) 0.9 % injection 3 mL, 3 mL, Intravenous, Q12H, Rosetta Posner, DPM, 3 mL at 07/15/21 1009   sodium chloride flush (NS) 0.9 % injection 3 mL, 3 mL, Intravenous, PRN, Rosetta Posner, DPM   Social History: Social History   Tobacco Use   Smoking status: Every Day    Packs/day: 0.50    Types: Cigarettes   Smokeless tobacco: Never  Substance Use Topics   Alcohol use: No   Drug use:  Never    Family Medical History: History reviewed. No pertinent family history.  Physical Examination: Vitals:   07/15/21 0903 07/15/21 1638  BP: 124/80 (!) 156/93  Pulse: 92 100  Resp: 16 16  Temp: 98.5 F (36.9 C) 97.8 F (36.6 C)  SpO2: 97% 96%    Heart sounds normal no MRG. Chest Clear to Auscultation Bilaterally.  General: Patient is well developed, well nourished, calm, collected, and in no apparent distress.  Psychiatric: Patient is non-anxious.  Head:  Pupils equal, round, and reactive to light.  ENT:  Oral mucosa appears well hydrated.  Neck:   Supple.  Full range of motion.  Respiratory: Patient is breathing without any difficulty.  Extremities: No edema.  Vascular: Palpable pulses in dorsal pedal vessels.  Skin:   On exposed skin, there are no abnormal skin lesions.  NEUROLOGICAL:  General: In no acute distress.   Awake, alert, oriented to person, place, and time.  Pupils equal round and reactive to light.  Facial tone is symmetric.  Tongue protrusion is midline.  There is no pronator drift.   Strength: Side Biceps Triceps Deltoid Interossei Grip Wrist Ext. Wrist Flex.  R 5 5 5 5 5 5 5   L 5 5 5 5 5 5 5    Side Iliopsoas Quads Hamstring PF DF EHL  R 5 5 5 5 5 5   L 5 5 5  - - -    Bilateral upper and lower extremity sensation is intact to light touch. Reflexes are 1+ and symmetric at the biceps, triceps, brachioradialis, patella and achilles. Hoffman's is absent.  Clonus is not tested. Gait not tested.   Imaging: MRI TL spine 07/14/21 FINDINGS: MRI THORACIC SPINE FINDINGS   Alignment:  Normal   Vertebrae: No fracture, evidence of discitis, or bone lesion.   Cord: Spinal cord is normal. However, there is a right is eccentric and dorsal epidural collection extending from T7-T12 with peripheral contrast enhancement. This causes mild attenuation of the thecal sac without spinal cord compression. There is a smaller component of the collection  at the left ventral aspect of the T11 level.   Paraspinal and other soft tissues: Negative   Disc levels:   No discogenic spinal canal stenosis.   MRI LUMBAR SPINE FINDINGS   Segmentation:  Standard.   Alignment:  Grade 1 anterolisthesis at L3-4   Vertebrae: Mild edema at the inferior endplate of L1 is likely degenerative.   Conus medullaris: The conus medullaris terminates at the L1-2 disc level. There is mild abnormal contrast enhancement of the cauda equina. Within the dorsal epidural space at L3-5, there is a fluid collection measuring up to 7 mm in thickness. This severely crowds the cauda equina nerve roots in the spinal canal.   Paraspinal and other soft tissues: Left psoas abscess measures 1.6 x 8.0 cm. There are small paraspinal abscesses on the left at the L4 level.  Disc levels:   T12-L1: Small disc bulge without spinal canal stenosis.   L1-2: Small disc bulge with endplate spurring.  No stenosis.   L2-3: Normal.   L3-4: Moderate facet hypertrophy and small disc bulge with moderate spinal canal stenosis.   L4-5: Severe facet arthrosis and small disc bulge with moderate spinal canal stenosis. No neural foraminal stenosis.   L5-S1: Severe facet arthrosis without spinal canal or neural foraminal stenosis.   IMPRESSION: 1. 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 There is moderate attenuation of the thecal sac. 2. Left psoas abscess and small left posterior paraspinal muscle abscess.     Electronically Signed   By: Deatra RobinsonKevin  Herman M.D.   On: 07/14/2021 22:54  I have personally reviewed the images and agree with the above interpretation.  Labs:    Latest Ref Rng & Units 07/15/2021    4:29 AM 07/14/2021    4:24 AM 07/13/2021    2:59 AM  CBC  WBC 4.0 - 10.5 K/uL 24.7   26.0   26.2    Hemoglobin 12.0 - 15.0 g/dL 60.410.5   54.012.4   98.111.4    Hematocrit 36.0 - 46.0 % 30.4   35.1   33.6    Platelets 150 - 400 K/uL 189   175   166          Assessment and Plan: [name] is a pleasant 66 y.o. female with multifocal infection with methicillin-resistant Staphylococcus aureus.  She has evidence of epidural component at T7 12 and L3-5.  At T7-12, there is a thin rim of epidural enhancing fluid.  This epidural collection is much thicker from L3-5.  It is worst at L3-4.  Based on her burden of biological disease, Dr. Rivka Saferavishankar is asked that I consider washout of her epidural fluid collections.  I do not think it is worthwhile to washout the thoracic spine given the thin rim of enhancing fluid.  However, I think it is reasonable to move forward with washout of the lumbar spine disease.  I discussed this at length with the patient, including the option of not washing this out.  She would like to move forward with washout.  We will plan this for tomorrow  I discussed the planned procedure at length with the patient, including the risks, benefits, alternatives, and indications. The risks discussed include but are not limited to bleeding, infection, need for reoperation, spinal fluid leak, stroke, vision loss, anesthetic complication, coma, paralysis, and even death. I also described in detail that improvement was not guaranteed.  The patient expressed understanding of these risks, and asked that we proceed with surgery. I described the surgery in layman's terms, and gave ample opportunity for questions, which were answered to the best of my ability.   Veer Elamin K. Myer HaffYarbrough MD, MPHS Dept. of Neurosurgery

## 2021-07-15 NOTE — Progress Notes (Signed)
  Progress Note   Patient: Christy Ray HGD:[id number] DOB: 11-01-1955 DOA: 07/12/2021     3 DOS: the patient was seen and examined on 07/15/2021   Brief hospital course: Christy Ray is a 66 y.o. 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  It started from the left great toe about 4 to 5 days prior to admission but symptoms progressively worsened.   She was admitted to the hospital for left foot cellulitis.  Blood culture showed Staph aureus.  Staph aureus bacteremia. MRI scan showed epidural abscess involving T7-12, L3-5.  Left psoas abscess.  Assessment and Plan: Left foot cellulitis with left fifth metatarsal septic joint. Staph aureus bacteremia. 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 Left psoas abscess. I reviewed the chart, patient did not meet septic criteria at time of admission. MRI showed multiple abscess as above, IR and neurosurgery is consulted.  Initial drain of psoas abscess was scheduled tomorrow.  Neurosurgery will consider additional draining of epidural abscess. Patient blood culture has Staph aureus.  Continue daptomycin, followed by ID. Patient probably will need a 6 to 8 weeks of IV antibiotics.  Discussed with Dr. Avon Gully, no need for TEE.  Dermatomyositis Immunocompromised. Patient was taking prednisone and CellCept chronically.  She is immunocompromised from this.  We will continue hold off CellCept.  Prednisone is continued.  Hypokalemia. Improved      Subjective:  Patient currently doing well, currently no fever or chills.   Physical Exam: Vitals:   07/14/21 1930 07/15/21 0018 07/15/21 0430 07/15/21 0903  BP: 127/85 (!) 132/94 133/82 124/80  Pulse: 87 86 89 92  Resp: 18 18 16 16   Temp: 97.6 F (36.4 C) 98 F (36.7 C) 98.1 F (36.7 C) 98.5 F (36.9 C)   TempSrc:    Oral  SpO2: 95% 97% 96% 97%  Weight:      Height:       General exam: Appears calm and comfortable  Respiratory system: Clear to auscultation. Respiratory effort normal. Cardiovascular system: S1 & S2 heard, RRR. No JVD, murmurs, rubs, gallops or clicks. No pedal edema. Gastrointestinal system: Abdomen is nondistended, soft and nontender. No organomegaly or masses felt. Normal bowel sounds heard. Central nervous system: Alert and oriented. No focal neurological deficits. Extremities: Symmetric 5 x 5 power. Skin: No rashes, lesions or ulcers Psychiatry: Judgement and insight appear normal. Mood & affect appropriate.   Data Reviewed:  Reviewed MRI results, lab results and culture results.  Family Communication: Husband updated on the phone  Disposition: Status is: Inpatient Remains inpatient appropriate because: Severity of disease, inpatient procedure, IV treatment.  Planned Discharge Destination:  pending    Time spent: 55 minutes  Author: , MD 07/15/2021 4:26 PM  For on call review [email or web address].

## 2021-07-15 NOTE — Consult Note (Signed)
Referring Physician:  No referring provider defined for this encounter.  Primary Physician:  Ethelda Chick, MD  Chief Complaint:  low back pain, left toe pain  History of Present Illness: 07/15/2021 Christy Ray is a 66 y.o. female who presents with the chief complaint of left great toe pain.  She also had back pain over the past few weeks.  She was brought into the emergency department by her husband on Tuesday, where work-up ultimately identified septic arthritis of her left great toe as well as thoracic and lumbar epidural abscesses.  She was also found to have a left psoas abscess.  She has been found to have methicillin resistant Staphylococcus aureus.  This was found on positive blood cultures.  Of note, she reports continued back pain but no evidence of radicular pain or weakness in her legs other than discomfort with hip flexion on the left side  Due to the presence of MRSA bacteremia and epidural abscess, I was asked to evaluate for possible drainage of her epidural component to decrease her burden of bacterial disease  Review of Systems:  A 10 point review of systems is negative, except for the pertinent positives and negatives detailed in the HPI.  Past Medical History: Past Medical History:  Diagnosis Date   Dermatomyositis (HCC)    Polyarthritis     Past Surgical History: Past Surgical History:  Procedure Laterality Date   INCISION AND DRAINAGE OF WOUND Left 07/14/2021   Procedure: IRRIGATION AND DEBRIDEMENT WOUND;  Surgeon: Rosetta Posner, DPM;  Location: [facility] OR[facility];  Service: Podiatry;  Laterality: Left;   OSTECTOMY Left 07/14/2021   Procedure: JOINT RESECTION;  Surgeon: Rosetta Posner, DPM;  Location: [facility] OR[facility];  Service: Podiatry;  Laterality: Left;    Allergies: Allergies as of 07/12/2021   (No Known Allergies)    Medications:  Current Facility-Administered Medications:    0.9 %  sodium chloride infusion, 250 mL, Intravenous, PRN, Rosetta Posner, DPM, Last  Rate: 10 mL/hr at 07/13/21 0124, 250 mL at 07/13/21 0124   acetaminophen (TYLENOL) tablet 650 mg, 650 mg, Oral, Q6H PRN, Rosetta Posner, DPM   amLODipine (NORVASC) tablet 2.5 mg, 2.5 mg, Oral, Daily, Rosetta Posner, DPM, 2.5 mg at 07/15/21 1008   cyclobenzaprine (FLEXERIL) tablet 5 mg, 5 mg, Oral, TID PRN, Rosetta Posner, DPM, 5 mg at 07/15/21 1009   DAPTOmycin (CUBICIN) 500 mg in sodium chloride 0.9 % IVPB, 8 mg/kg, Intravenous, Q2000, Rosetta Posner, DPM, Last Rate: 120 mL/hr at 07/15/21 0135, 500 mg at 07/15/21 0135   enoxaparin (LOVENOX) injection 40 mg, 40 mg, Subcutaneous, Q24H, Rosetta Posner, DPM   folic acid (FOLVITE) tablet 1 mg, 1 mg, Oral, Daily, Rosetta Posner, DPM, 1 mg at 07/15/21 1009   losartan (COZAAR) tablet 100 mg, 100 mg, Oral, Daily, 100 mg at 07/15/21 1009 **AND** hydrochlorothiazide (HYDRODIURIL) tablet 12.5 mg, 12.5 mg, Oral, Daily, Rosetta Posner, DPM, 12.5 mg at 07/15/21 1009   hydrOXYzine (ATARAX) tablet 10 mg, 10 mg, Oral, QHS, Rosetta Posner, DPM, 10 mg at 07/14/21 2234   morphine (PF) 4 MG/ML injection 4 mg, 4 mg, Intravenous, Q4H PRN, Rosetta Posner, DPM, 4 mg at 07/14/21 1603   ondansetron (ZOFRAN) tablet 4 mg, 4 mg, Oral, Q6H PRN **OR** ondansetron (ZOFRAN) injection 4 mg, 4 mg, Intravenous, Q6H PRN, Rosetta Posner, DPM, 4 mg at 07/14/21 1358   oxyCODONE (Oxy IR/ROXICODONE) immediate release tablet 5 mg, 5 mg, Oral, Q4H PRN, Rosetta Posner, DPM, 5 mg at 07/14/21 2239   rosuvastatin (CRESTOR)  tablet 10 mg, 10 mg, Oral, Daily, Rosetta Posner, DPM, 10 mg at 07/15/21 1009   sodium chloride flush (NS) 0.9 % injection 3 mL, 3 mL, Intravenous, Q12H, Rosetta Posner, DPM, 3 mL at 07/15/21 1009   sodium chloride flush (NS) 0.9 % injection 3 mL, 3 mL, Intravenous, PRN, Rosetta Posner, DPM   Social History: Social History   Tobacco Use   Smoking status: Every Day    Packs/day: 0.50    Types: Cigarettes   Smokeless tobacco: Never  Substance Use Topics   Alcohol use: No   Drug use:  Never    Family Medical History: History reviewed. No pertinent family history.  Physical Examination: Vitals:   07/15/21 0903 07/15/21 1638  BP: 124/80 (!) 156/93  Pulse: 92 100  Resp: 16 16  Temp: 98.5 F (36.9 C) 97.8 F (36.6 C)  SpO2: 97% 96%    Heart sounds normal no MRG. Chest Clear to Auscultation Bilaterally.  General: Patient is well developed, well nourished, calm, collected, and in no apparent distress.  Psychiatric: Patient is non-anxious.  Head:  Pupils equal, round, and reactive to light.  ENT:  Oral mucosa appears well hydrated.  Neck:   Supple.  Full range of motion.  Respiratory: Patient is breathing without any difficulty.  Extremities: No edema.  Vascular: Palpable pulses in dorsal pedal vessels.  Skin:   On exposed skin, there are no abnormal skin lesions.  NEUROLOGICAL:  General: In no acute distress.   Awake, alert, oriented to person, place, and time.  Pupils equal round and reactive to light.  Facial tone is symmetric.  Tongue protrusion is midline.  There is no pronator drift.   Strength: Side Biceps Triceps Deltoid Interossei Grip Wrist Ext. Wrist Flex.  R 5 5 5 5 5 5 5   L 5 5 5 5 5 5 5    Side Iliopsoas Quads Hamstring PF DF EHL  R 5 5 5 5 5 5   L 5 5 5  - - -    Bilateral upper and lower extremity sensation is intact to light touch. Reflexes are 1+ and symmetric at the biceps, triceps, brachioradialis, patella and achilles. Hoffman's is absent.  Clonus is not tested. Gait not tested.   Imaging: MRI TL spine 07/14/21 FINDINGS: MRI THORACIC SPINE FINDINGS   Alignment:  Normal   Vertebrae: No fracture, evidence of discitis, or bone lesion.   Cord: Spinal cord is normal. However, there is a right is eccentric and dorsal epidural collection extending from T7-T12 with peripheral contrast enhancement. This causes mild attenuation of the thecal sac without spinal cord compression. There is a smaller component of the collection  at the left ventral aspect of the T11 level.   Paraspinal and other soft tissues: Negative   Disc levels:   No discogenic spinal canal stenosis.   MRI LUMBAR SPINE FINDINGS   Segmentation:  Standard.   Alignment:  Grade 1 anterolisthesis at L3-4   Vertebrae: Mild edema at the inferior endplate of L1 is likely degenerative.   Conus medullaris: The conus medullaris terminates at the L1-2 disc level. There is mild abnormal contrast enhancement of the cauda equina. Within the dorsal epidural space at L3-5, there is a fluid collection measuring up to 7 mm in thickness. This severely crowds the cauda equina nerve roots in the spinal canal.   Paraspinal and other soft tissues: Left psoas abscess measures 1.6 x 8.0 cm. There are small paraspinal abscesses on the left at the L4 level.  Disc levels:   T12-L1: Small disc bulge without spinal canal stenosis.   L1-2: Small disc bulge with endplate spurring.  No stenosis.   L2-3: Normal.   L3-4: Moderate facet hypertrophy and small disc bulge with moderate spinal canal stenosis.   L4-5: Severe facet arthrosis and small disc bulge with moderate spinal canal stenosis. No neural foraminal stenosis.   L5-S1: Severe facet arthrosis without spinal canal or neural foraminal stenosis.   IMPRESSION: 1. 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 There is moderate attenuation of the thecal sac. 2. Left psoas abscess and small left posterior paraspinal muscle abscess.     Electronically Signed   By: Kevin  Herman M.D.   On: 07/14/2021 22:54  I have personally reviewed the images and agree with the above interpretation.  Labs:    Latest Ref Rng & Units 07/15/2021    4:29 AM 07/14/2021    4:24 AM 07/13/2021    2:59 AM  CBC  WBC 4.0 - 10.5 K/uL 24.7   26.0   26.2    Hemoglobin 12.0 - 15.0 g/dL 10.5   12.4   11.4    Hematocrit 36.0 - 46.0 % 30.4   35.1   33.6    Platelets 150 - 400 K/uL 189   175   166          Assessment and Plan: Christy Ray is a pleasant 66 y.o. female with multifocal infection with methicillin-resistant Staphylococcus aureus.  She has evidence of epidural component at T7 12 and L3-5.  At T7-12, there is a thin rim of epidural enhancing fluid.  This epidural collection is much thicker from L3-5.  It is worst at L3-4.  Based on her burden of biological disease, Dr. Ravishankar is asked that I consider washout of her epidural fluid collections.  I do not think it is worthwhile to washout the thoracic spine given the thin rim of enhancing fluid.  However, I think it is reasonable to move forward with washout of the lumbar spine disease.  I discussed this at length with the patient, including the option of not washing this out.  She would like to move forward with washout.  We will plan this for tomorrow  I discussed the planned procedure at length with the patient, including the risks, benefits, alternatives, and indications. The risks discussed include but are not limited to bleeding, infection, need for reoperation, spinal fluid leak, stroke, vision loss, anesthetic complication, coma, paralysis, and even death. I also described in detail that improvement was not guaranteed.  The patient expressed understanding of these risks, and asked that we proceed with surgery. I described the surgery in layman's terms, and gave ample opportunity for questions, which were answered to the best of my ability.   Draden Cottingham K. Kairee Isa MD, MPHS Dept. of Neurosurgery    

## 2021-07-16 ENCOUNTER — Encounter: Payer: Self-pay | Admitting: Internal Medicine

## 2021-07-16 ENCOUNTER — Encounter
Admission: EM | Disposition: A | Discharge status: Home Health | Payer: Self-pay | Source: Home / Self Care | Attending: Internal Medicine

## 2021-07-16 ENCOUNTER — Inpatient Hospital Stay: Payer: Medicare Other

## 2021-07-16 ENCOUNTER — Inpatient Hospital Stay: Payer: Medicare Other | Admitting: Anesthesiology

## 2021-07-16 DIAGNOSIS — G061 Intraspinal abscess and granuloma: Secondary | ICD-10-CM | POA: Diagnosis not present

## 2021-07-16 DIAGNOSIS — L03116 Cellulitis of left lower limb: Secondary | ICD-10-CM | POA: Diagnosis not present

## 2021-07-16 DIAGNOSIS — R7881 Bacteremia: Secondary | ICD-10-CM | POA: Diagnosis not present

## 2021-07-16 DIAGNOSIS — B9562 Methicillin resistant Staphylococcus aureus infection as the cause of diseases classified elsewhere: Secondary | ICD-10-CM

## 2021-07-16 HISTORY — PX: LUMBAR LAMINECTOMY FOR EPIDURAL ABSCESS: SHX5956

## 2021-07-16 LAB — CBC
HCT: 31.9 % — ABNORMAL LOW (ref 36.0–46.0)
Hemoglobin: 10.9 g/dL — ABNORMAL LOW (ref 12.0–15.0)
MCH: 27.9 pg (ref 26.0–34.0)
MCHC: 34.2 g/dL (ref 30.0–36.0)
MCV: 81.6 fL (ref 80.0–100.0)
Platelets: 227 10*3/uL (ref 150–400)
RBC: 3.91 MIL/uL (ref 3.87–5.11)
RDW: 15.8 % — ABNORMAL HIGH (ref 11.5–15.5)
WBC: 20 10*3/uL — ABNORMAL HIGH (ref 4.0–10.5)
nRBC: 0 % (ref 0.0–0.2)

## 2021-07-16 LAB — PROTIME-INR
INR: 1.2 (ref 0.8–1.2)
Prothrombin Time: 14.7 seconds (ref 11.4–15.2)

## 2021-07-16 IMAGING — CT CT IMAGE GUIDED DRAINAGE BY PERCUTANEOUS CATHETER
1 of 3 series · 13 of 32 positions shown, 18 images · non-contrast
Comparison: none

INDICATION: Left psoas abscess

EXAM:
CT-guided drain placement into left psoas abscess
TECHNIQUE: Multidetector CT imaging of the abdomen and pelvis was performed
following the standard protocol without IV contrast.

[Series 2: i-spiral 5.0 br38 · axial · 0.72mm/px · z∈[-293,-125]mm · 13 of 55 slices shown, 18 images]
[im 4/55  soft-tissue]
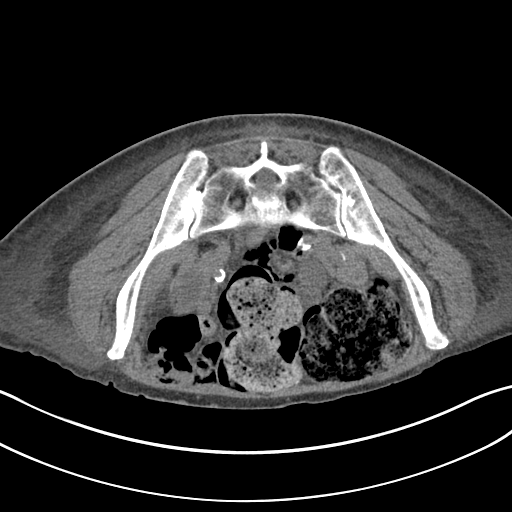
[im 4/55  bone]
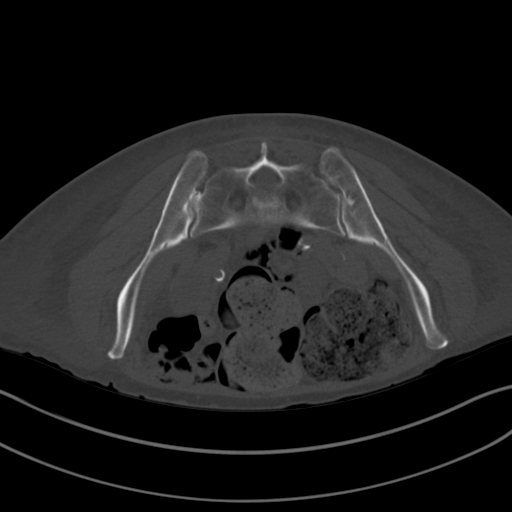
[im 10/55  soft-tissue]
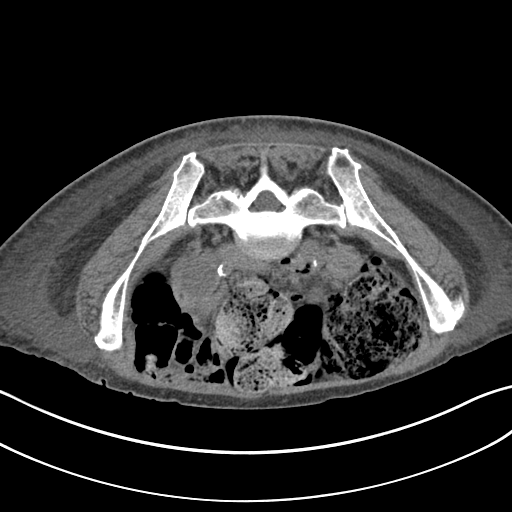
[im 13/55  soft-tissue]
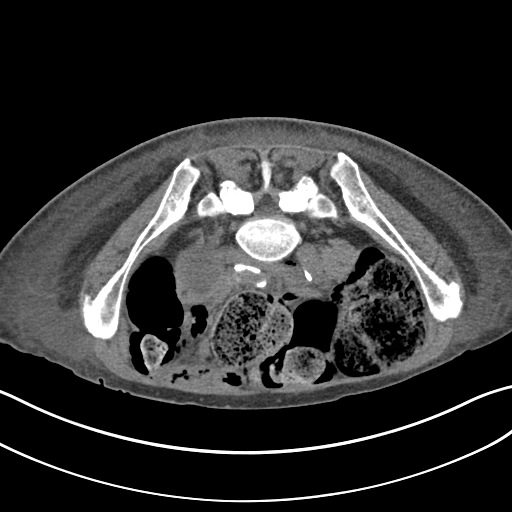
[im 16/55  soft-tissue]
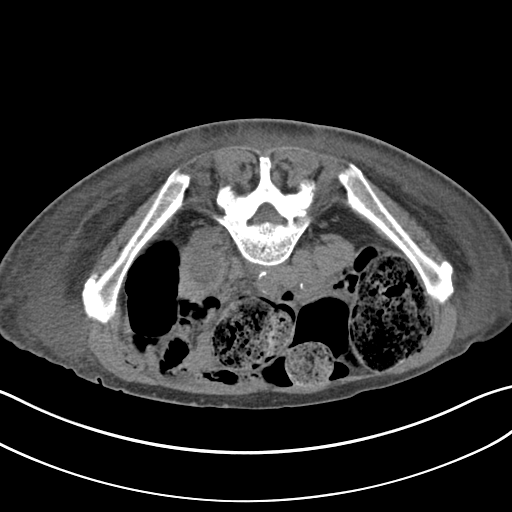
[im 22/55  soft-tissue]
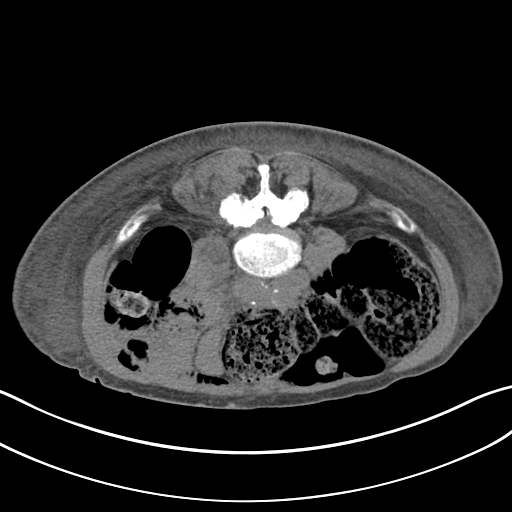
[im 25/55  soft-tissue]
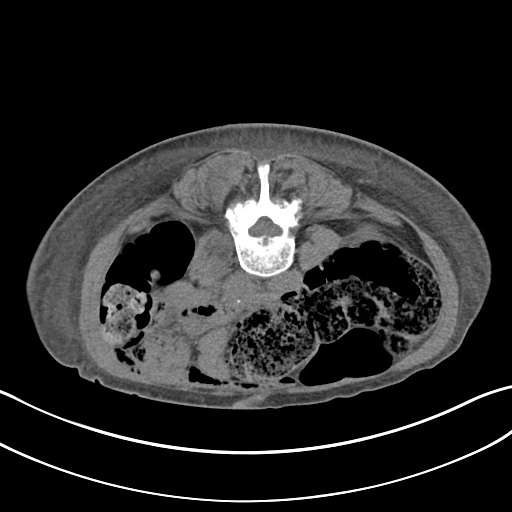
[im 31/55  soft-tissue]
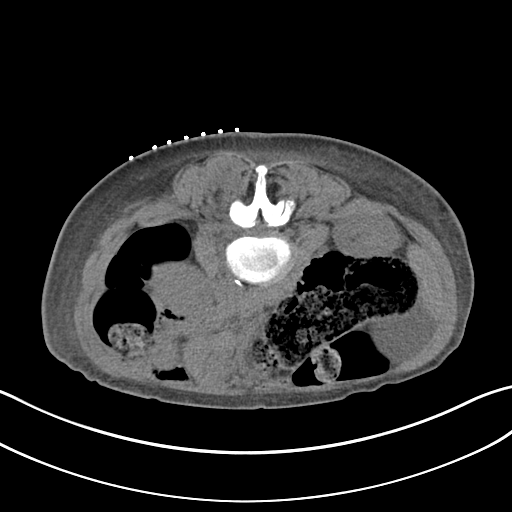
[im 34/55  soft-tissue]
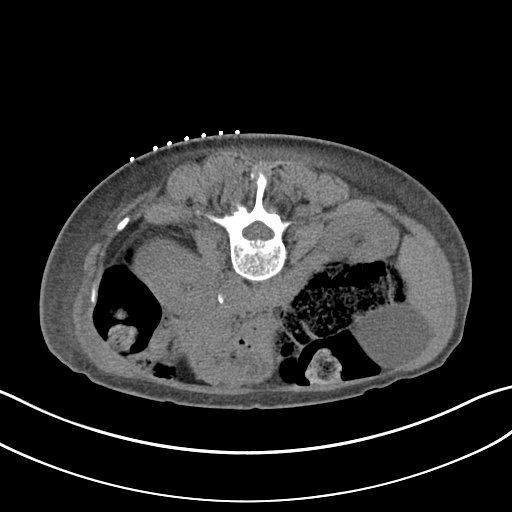
[im 40/55  soft-tissue]
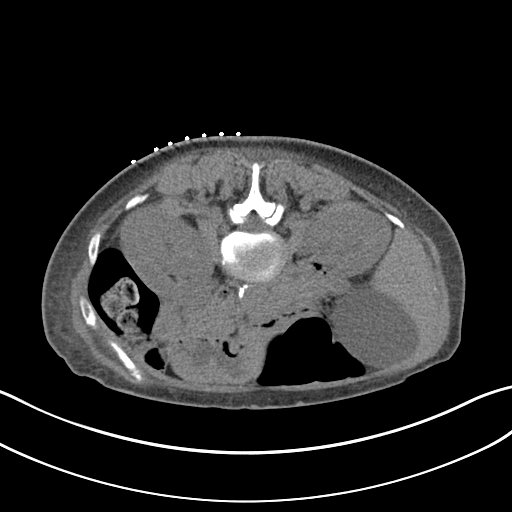
[im 40/55  bone]
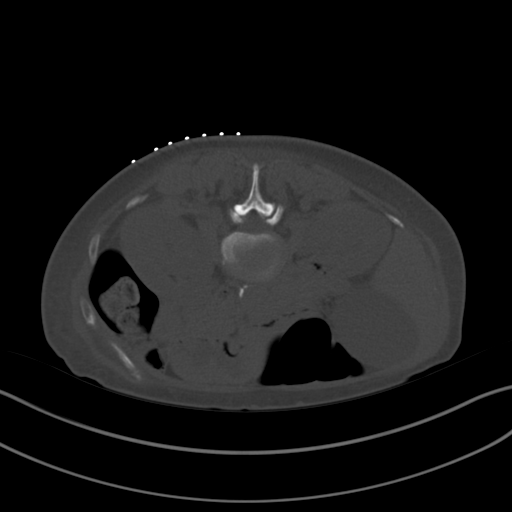
[im 43/55  soft-tissue]
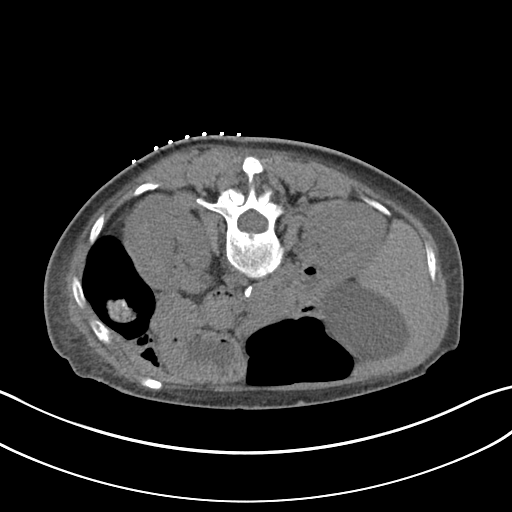
[im 43/55  lung]
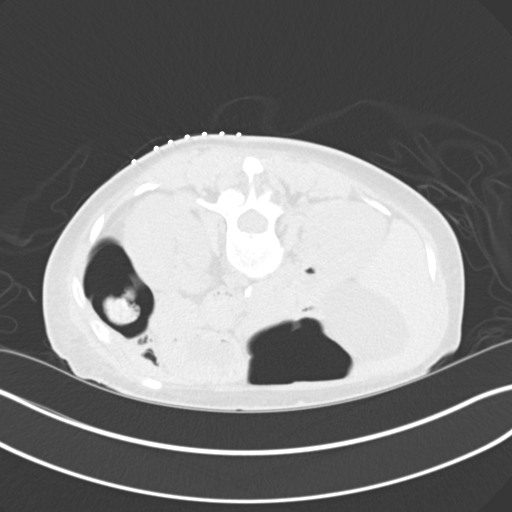
[im 46/55  soft-tissue]
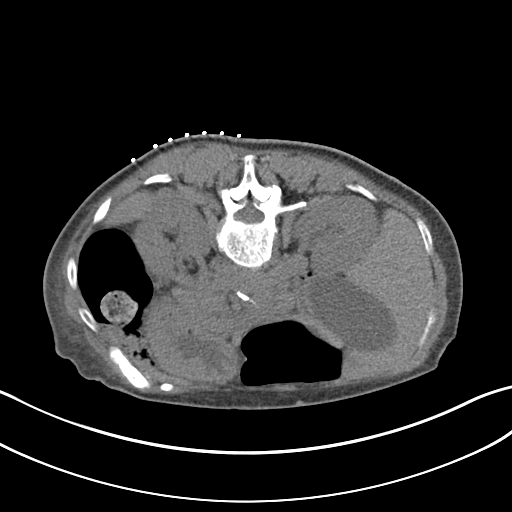
[im 46/55  lung]
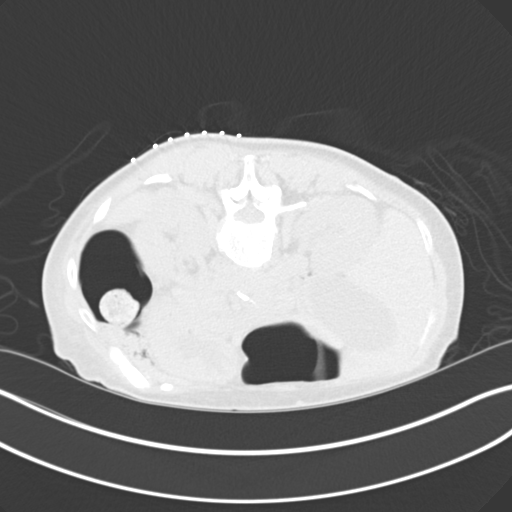
[im 49/55  lung]
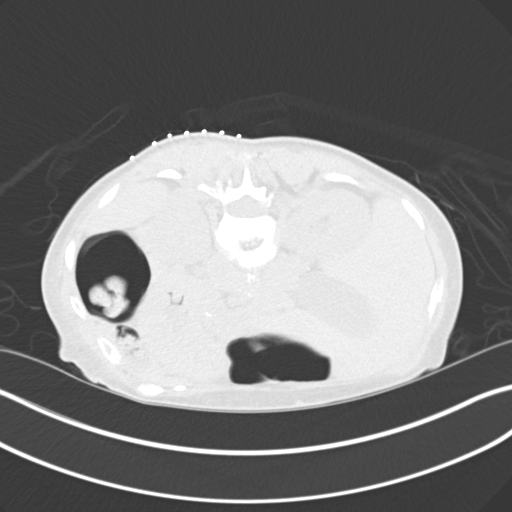
[im 52/55  soft-tissue]
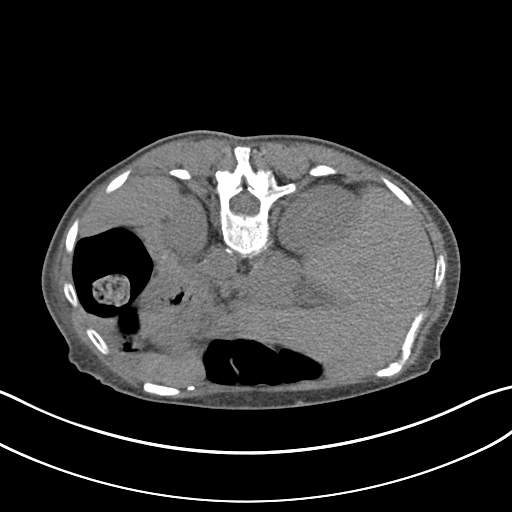
[im 52/55  lung]
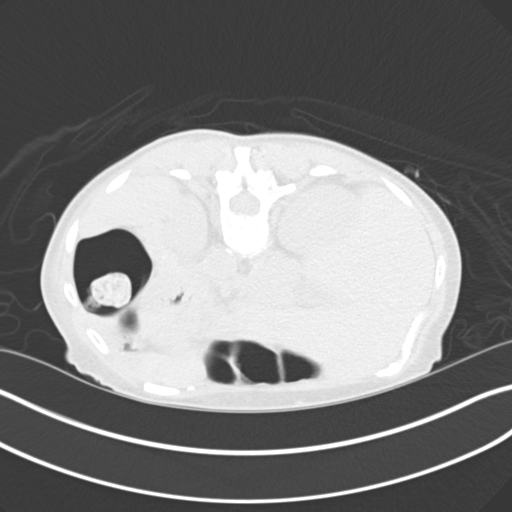

[13 of 32 positions shown; findings below may reference images not displayed]

RADIATION DOSE REDUCTION: This exam was performed according to the
departmental dose-optimization program which includes automated
exposure control, adjustment of the mA and/or kV according to
patient size and/or use of iterative reconstruction technique.

MEDICATIONS:
The patient is currently admitted to the hospital and receiving
intravenous antibiotics. The antibiotics were administered within an
appropriate time frame prior to the initiation of the procedure.

ANESTHESIA/SEDATION:
Moderate (conscious) sedation was employed during this procedure. A
total of Versed 1.5 mg and Fentanyl 75 mcg was administered
intravenously by the radiology nurse.

Total intra-service moderate Sedation Time: 22 minutes. The
patient's level of consciousness and vital signs were monitored
continuously by radiology nursing throughout the procedure under my
direct supervision.

COMPLICATIONS:
None immediate.

PROCEDURE:
Informed written consent was obtained from the patient after a
thorough discussion of the procedural risks, benefits and
alternatives. All questions were addressed. Maximal Sterile Barrier
Technique was utilized including caps, mask, sterile gowns, sterile
gloves, sterile drape, hand hygiene and skin antiseptic. A timeout
was performed prior to the initiation of the procedure.

The patient was placed prone on the exam table. Limited CT of the
abdomen and pelvis was performed for planning purposes, which again
demonstrated a hypodense fluid collection within the left psoas
muscle concerning for abscess. Skin entry site was marked, the
overlying skin was prepped and draped in the standard sterile
fashion. Local analgesia was obtained with 1% lidocaine. Using
intermittent CT fluoroscopy, a 18 gauge trocar needle was advanced
towards the identified fluid collection using a posterior approach.
Location was confirmed with CT and return of purulent material. An
035 Amplatz wire was advanced through the needle, over which the
percutaneous tract was serially dilated to accommodate a 12 French
locking multipurpose drainage catheter. Location was again confirmed
with CT and return of additional purulent material. A total of
approximately 20 mL of purulent material was aspirated and sent to
the lab for microbiology analysis. The drainage catheter was secured
to the skin using silk suture and a dressing. It was attached to
bulb suction. The patient tolerated the procedure well without
immediate complication.
IMPRESSION: Successful CT-guided placement of a 12 French drainage catheter into
the left psoas abscess. 20 mL of purulent material aspirated, with a
sample sent to the lab for microbiology analysis.

## 2021-07-16 IMAGING — RF DG LUMBAR SPINE 2-3V
1 series · 4 of 4 positions shown · non-contrast
Comparison: CT-guided drain placement into psoas abscess, images
from [DATE].

CLINICAL DATA: Post lumbar laminectomy reportedly for abscess.

EXAM:
LUMBAR SPINE - 2-3 VIEW; DG C-ARM 1-60 MIN-NO REPORT

[Series 1: dg x-ray · 0.20mm/px · 4 of 4 slices shown]
[im 1/4]
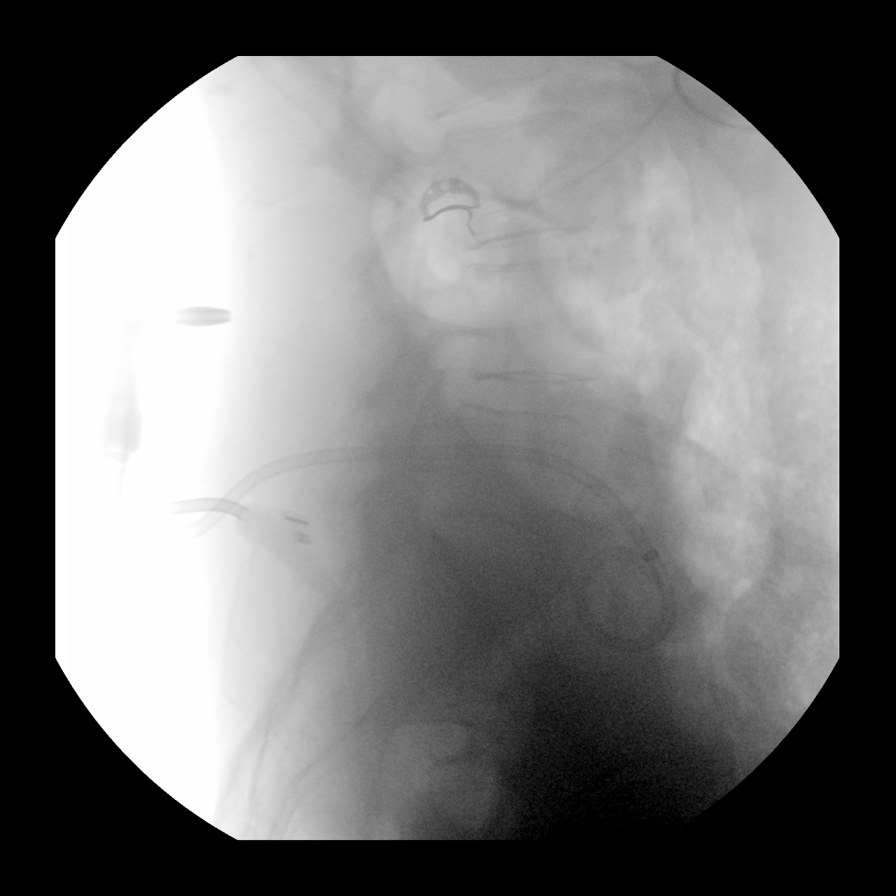
[im 2/4]
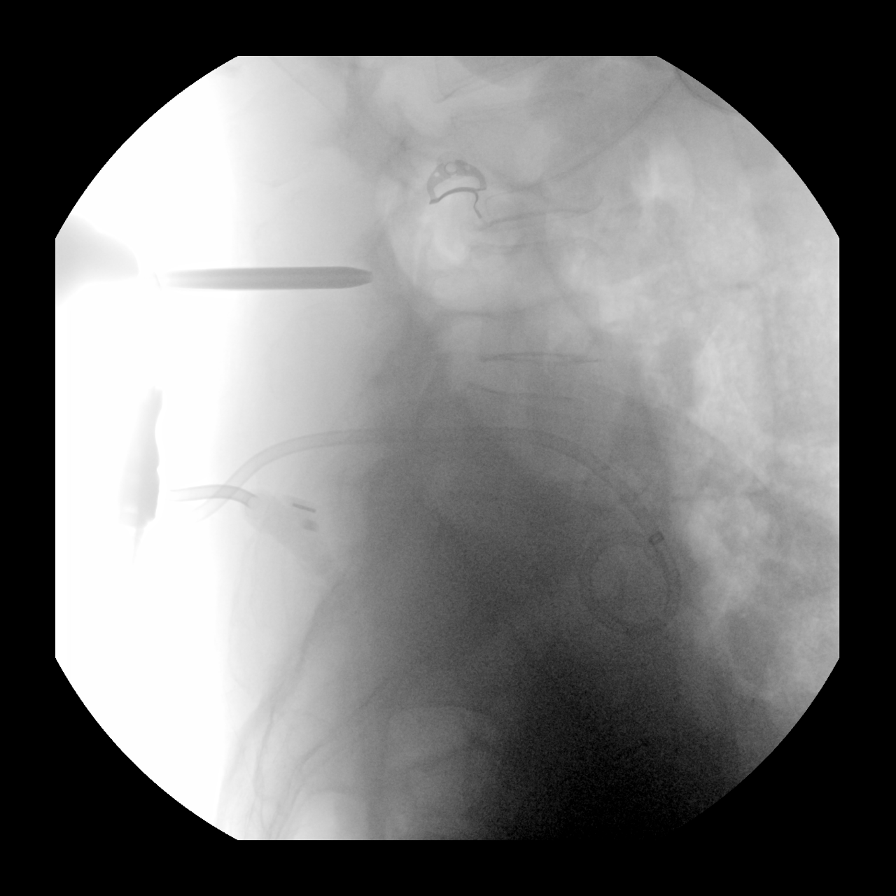
[im 3/4]
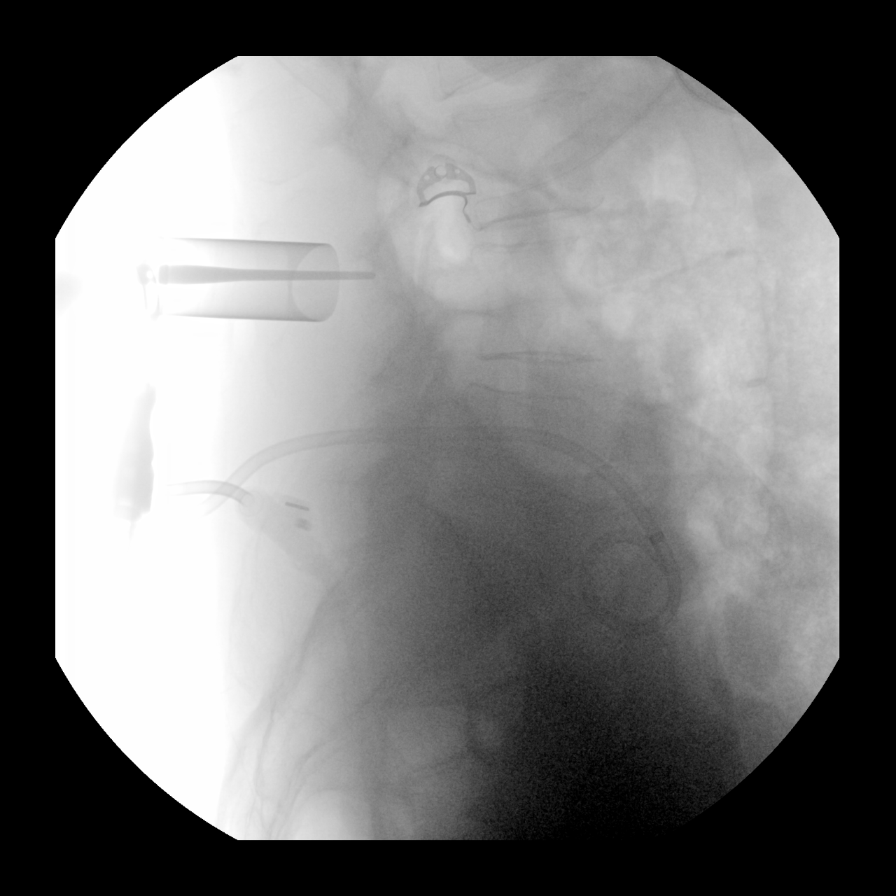
[im 4/4]
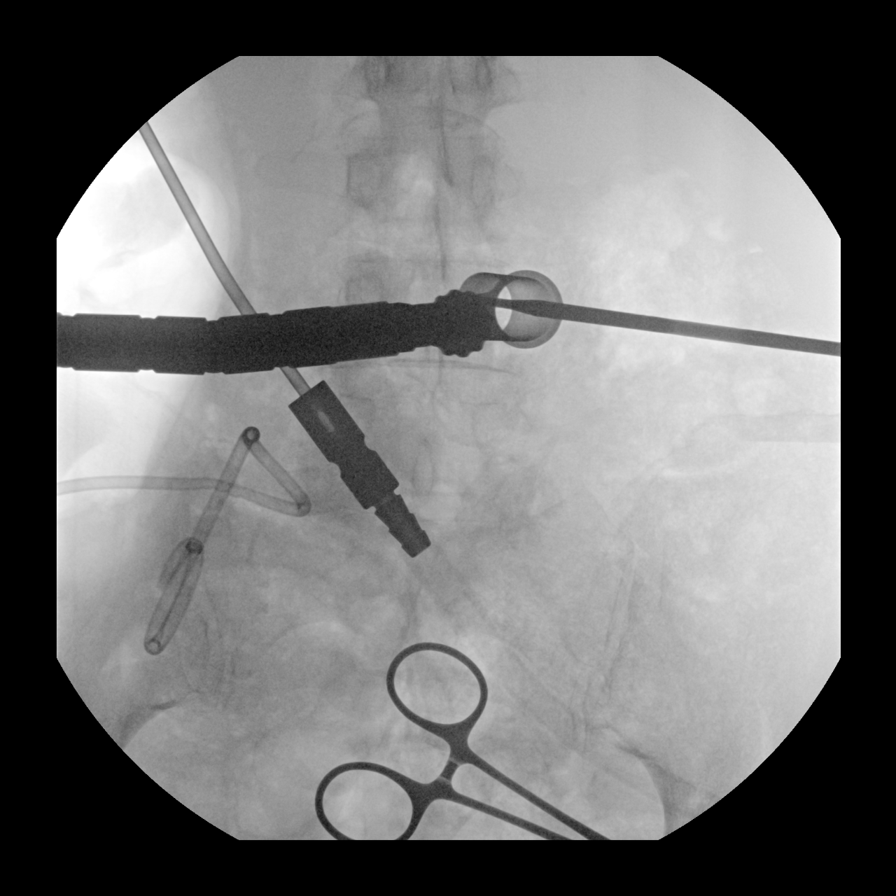

[4 of 4 positions shown; findings below may reference images not displayed]

FINDINGS: Limited intraoperative fluoroscopic views are provided.

Total of 4 fluoroscopic images.

Initial image shows a radiopaque indicator directed at the L4 level
and a pigtail drain projecting over L5.

Subsequent image shows indicator directed into the soft tissues tip
overlying posterior elements also at the L4 level.

Placement of a port at the level of the L4 pedicle on subsequent
image shown on the RIGHT, pigtail drain in LEFT psoas. Surgical
instrument directed through the port. Hemostats project over the
lower pelvis on the final image.
IMPRESSION: 1. Intraoperative fluoroscopic localization of the L4 level. Signs
of pigtail drain in the contralateral psoas muscle
2. Fluoroscopic time: 3 seconds
3. Fluoroscopic dose: 1.225 mGy.

## 2021-07-16 SURGERY — LUMBAR LAMINECTOMY FOR EPIDURAL ABSCESS
Anesthesia: General | Site: Spine Lumbar

## 2021-07-16 MED ORDER — METHYLPREDNISOLONE ACETATE 40 MG/ML IJ SUSP
INTRAMUSCULAR | Status: AC
  Filled 2021-07-16: qty 1

## 2021-07-16 MED ORDER — DEXAMETHASONE SODIUM PHOSPHATE 10 MG/ML IJ SOLN
INTRAMUSCULAR | Status: DC | PRN
  Administered 2021-07-16: 10 mg via INTRAVENOUS

## 2021-07-16 MED ORDER — KETAMINE HCL 50 MG/5ML IJ SOSY
PREFILLED_SYRINGE | INTRAMUSCULAR | Status: AC
  Filled 2021-07-16: qty 5

## 2021-07-16 MED ORDER — MIDAZOLAM HCL 2 MG/2ML IJ SOLN
INTRAMUSCULAR | Status: AC
Start: 2021-07-16 — End: ?
  Filled 2021-07-16: qty 2

## 2021-07-16 MED ORDER — ONDANSETRON HCL 4 MG/2ML IJ SOLN
INTRAMUSCULAR | Status: DC | PRN
  Administered 2021-07-16: 4 mg via INTRAVENOUS

## 2021-07-16 MED ORDER — OXYCODONE HCL 5 MG/5ML PO SOLN: 5.0000 mg | Freq: Once | ORAL | Status: DC | PRN

## 2021-07-16 MED ORDER — SODIUM CHLORIDE 0.9 % IV SOLN: INTRAVENOUS | Status: DC

## 2021-07-16 MED ORDER — BUPIVACAINE-EPINEPHRINE (PF) 0.5% -1:200000 IJ SOLN
INTRAMUSCULAR | Status: AC
  Filled 2021-07-16: qty 30

## 2021-07-16 MED ORDER — SODIUM CHLORIDE FLUSH 0.9 % IV SOLN
INTRAVENOUS | Status: AC
  Filled 2021-07-16: qty 20

## 2021-07-16 MED ORDER — ACETAMINOPHEN 10 MG/ML IV SOLN
INTRAVENOUS | Status: DC | PRN
  Administered 2021-07-16: 1000 mg via INTRAVENOUS

## 2021-07-16 MED ORDER — SODIUM CHLORIDE 0.9% FLUSH
5.0000 mL | Freq: Three times a day (TID) | INTRAVENOUS | Status: DC
  Administered 2021-07-16 – 2021-07-20 (×10): 5 mL

## 2021-07-16 MED ORDER — FENTANYL CITRATE (PF) 100 MCG/2ML IJ SOLN
INTRAMUSCULAR | Status: AC | PRN
  Administered 2021-07-16: 25 ug via INTRAVENOUS
  Administered 2021-07-16: 50 ug via INTRAVENOUS

## 2021-07-16 MED ORDER — BUPIVACAINE-EPINEPHRINE (PF) 0.5% -1:200000 IJ SOLN
INTRAMUSCULAR | Status: DC | PRN
  Administered 2021-07-16: 7 mL

## 2021-07-16 MED ORDER — PROPOFOL 10 MG/ML IV BOLUS
INTRAVENOUS | Status: DC | PRN
  Administered 2021-07-16: 100 mg via INTRAVENOUS

## 2021-07-16 MED ORDER — HYDROMORPHONE HCL 1 MG/ML IJ SOLN: 0.5000 mg | INTRAMUSCULAR | Status: DC | PRN

## 2021-07-16 MED ORDER — BUPIVACAINE HCL 0.5 % IJ SOLN: INTRAMUSCULAR | Status: DC | PRN

## 2021-07-16 MED ORDER — KETAMINE HCL 10 MG/ML IJ SOLN
INTRAMUSCULAR | Status: DC | PRN
  Administered 2021-07-16: 30 mg via INTRAVENOUS

## 2021-07-16 MED ORDER — LACTATED RINGERS IV SOLN: INTRAVENOUS | Status: DC

## 2021-07-16 MED ORDER — MIDAZOLAM HCL 2 MG/2ML IJ SOLN
INTRAMUSCULAR | Status: AC
Start: 2021-07-16 — End: 2021-07-16
  Filled 2021-07-16: qty 4

## 2021-07-16 MED ORDER — FENTANYL CITRATE (PF) 100 MCG/2ML IJ SOLN: 25.0000 ug | INTRAMUSCULAR | Status: DC | PRN

## 2021-07-16 MED ORDER — ONDANSETRON HCL 4 MG/2ML IJ SOLN: 4.0000 mg | Freq: Once | INTRAMUSCULAR | Status: DC | PRN

## 2021-07-16 MED ORDER — ROCURONIUM BROMIDE 100 MG/10ML IV SOLN
INTRAVENOUS | Status: DC | PRN
  Administered 2021-07-16: 60 mg via INTRAVENOUS

## 2021-07-16 MED ORDER — ONDANSETRON HCL 4 MG/2ML IJ SOLN
INTRAMUSCULAR | Status: AC
  Filled 2021-07-16: qty 2

## 2021-07-16 MED ORDER — FENTANYL CITRATE (PF) 250 MCG/5ML IJ SOLN
INTRAMUSCULAR | Status: AC
  Filled 2021-07-16: qty 5

## 2021-07-16 MED ORDER — VASOPRESSIN 20 UNIT/ML IV SOLN
INTRAVENOUS | Status: AC
  Filled 2021-07-16: qty 1

## 2021-07-16 MED ORDER — BUPIVACAINE LIPOSOME 1.3 % IJ SUSP
INTRAMUSCULAR | Status: AC
  Filled 2021-07-16: qty 20

## 2021-07-16 MED ORDER — ACETAMINOPHEN 10 MG/ML IV SOLN: 1000.0000 mg | Freq: Once | INTRAVENOUS | Status: DC | PRN

## 2021-07-16 MED ORDER — MIDAZOLAM HCL 2 MG/2ML IJ SOLN
INTRAMUSCULAR | Status: DC | PRN
  Administered 2021-07-16: 2 mg via INTRAVENOUS

## 2021-07-16 MED ORDER — ACETAMINOPHEN 10 MG/ML IV SOLN
INTRAVENOUS | Status: AC
  Filled 2021-07-16: qty 100

## 2021-07-16 MED ORDER — FENTANYL CITRATE (PF) 100 MCG/2ML IJ SOLN
INTRAMUSCULAR | Status: DC | PRN
  Administered 2021-07-16: 100 ug via INTRAVENOUS

## 2021-07-16 MED ORDER — SUGAMMADEX SODIUM 200 MG/2ML IV SOLN
INTRAVENOUS | Status: DC | PRN
  Administered 2021-07-16: 150 mg via INTRAVENOUS

## 2021-07-16 MED ORDER — MIDAZOLAM HCL 2 MG/2ML IJ SOLN
INTRAMUSCULAR | Status: AC | PRN
Start: 2021-07-16 — End: 2021-07-16
  Administered 2021-07-16: .5 mg via INTRAVENOUS
  Administered 2021-07-16: 1 mg via INTRAVENOUS

## 2021-07-16 MED ORDER — SURGIPHOR WOUND IRRIGATION SYSTEM - OPTIME
TOPICAL | Status: DC | PRN
  Administered 2021-07-16: 100 mL

## 2021-07-16 MED ORDER — PROPOFOL 10 MG/ML IV BOLUS
INTRAVENOUS | Status: AC
  Filled 2021-07-16: qty 20

## 2021-07-16 MED ORDER — OXYCODONE HCL 5 MG PO TABS: 5.0000 mg | ORAL_TABLET | Freq: Once | ORAL | Status: DC | PRN

## 2021-07-16 MED ORDER — FENTANYL CITRATE (PF) 100 MCG/2ML IJ SOLN
INTRAMUSCULAR | Status: AC
  Filled 2021-07-16: qty 4

## 2021-07-16 MED ORDER — 0.9 % SODIUM CHLORIDE (POUR BTL) OPTIME
TOPICAL | Status: DC | PRN
  Administered 2021-07-16: 1000 mL

## 2021-07-16 MED ORDER — SURGIFLO WITH THROMBIN (HEMOSTATIC MATRIX KIT) OPTIME
TOPICAL | Status: DC | PRN
  Administered 2021-07-16: 1 via TOPICAL

## 2021-07-16 MED ORDER — ENOXAPARIN SODIUM 40 MG/0.4ML IJ SOSY
40.0000 mg | PREFILLED_SYRINGE | INTRAMUSCULAR | Status: DC
  Administered 2021-07-17 – 2021-07-20 (×4): 40 mg via SUBCUTANEOUS
  Filled 2021-07-16 (×4): qty 0.4

## 2021-07-16 MED ORDER — ROCURONIUM BROMIDE 10 MG/ML (PF) SYRINGE
PREFILLED_SYRINGE | INTRAVENOUS | Status: AC
  Filled 2021-07-16: qty 10

## 2021-07-16 MED ORDER — DEXAMETHASONE SODIUM PHOSPHATE 10 MG/ML IJ SOLN
INTRAMUSCULAR | Status: AC
  Filled 2021-07-16: qty 1

## 2021-07-16 MED ORDER — PHENYLEPHRINE HCL-NACL 20-0.9 MG/250ML-% IV SOLN
INTRAVENOUS | Status: DC | PRN
  Administered 2021-07-16: 40 ug/min via INTRAVENOUS

## 2021-07-16 MED ORDER — PHENYLEPHRINE 80 MCG/ML (10ML) SYRINGE FOR IV PUSH (FOR BLOOD PRESSURE SUPPORT)
PREFILLED_SYRINGE | INTRAVENOUS | Status: DC | PRN
  Administered 2021-07-16 (×5): 160 ug via INTRAVENOUS

## 2021-07-16 MED ORDER — LIDOCAINE HCL (CARDIAC) PF 100 MG/5ML IV SOSY
PREFILLED_SYRINGE | INTRAVENOUS | Status: DC | PRN
  Administered 2021-07-16: 80 mg via INTRAVENOUS

## 2021-07-16 MED ORDER — BUPIVACAINE HCL (PF) 0.5 % IJ SOLN
INTRAMUSCULAR | Status: AC
  Filled 2021-07-16: qty 30

## 2021-07-16 MED ORDER — VASOPRESSIN 20 UNIT/ML IV SOLN
INTRAVENOUS | Status: DC | PRN
  Administered 2021-07-16 (×2): 1 [IU] via INTRAVENOUS

## 2021-07-16 MED ORDER — LIDOCAINE HCL (PF) 2 % IJ SOLN
INTRAMUSCULAR | Status: AC
  Filled 2021-07-16: qty 5

## 2021-07-16 SURGICAL SUPPLY — 56 items
BLADE EXTENDED COATED 6.5IN (ELECTRODE) ×1 IMPLANT
BUR NEURO DRILL SOFT 3.0X3.8M (BURR) ×2 IMPLANT
CHLORAPREP W/TINT 26 (MISCELLANEOUS) ×4 IMPLANT
CNTNR SPEC 2.5X3XGRAD LEK (MISCELLANEOUS) ×1
CONT SPEC 4OZ STER OR WHT (MISCELLANEOUS) ×1
CONTAINER SPEC 2.5X3XGRAD LEK (MISCELLANEOUS) ×1 IMPLANT
COUNTER NEEDLE 20/40 LG (NEEDLE) ×2 IMPLANT
CUP MEDICINE 2OZ PLAST GRAD ST (MISCELLANEOUS) ×4 IMPLANT
DERMABOND ADVANCED (GAUZE/BANDAGES/DRESSINGS) ×1
DERMABOND ADVANCED .7 DNX12 (GAUZE/BANDAGES/DRESSINGS) ×1 IMPLANT
DRAPE C ARM PK CFD 31 SPINE (DRAPES) ×2 IMPLANT
DRAPE LAPAROTOMY 100X77 ABD (DRAPES) ×2 IMPLANT
DRAPE MICROSCOPE SPINE 48X150 (DRAPES) ×2 IMPLANT
DRAPE SURG 17X11 SM STRL (DRAPES) ×2 IMPLANT
ELECT CAUTERY BLADE TIP 2.5 (TIP) ×2
ELECT EZSTD 165MM 6.5IN (MISCELLANEOUS)
ELECT REM PT RETURN 9FT ADLT (ELECTROSURGICAL) ×2
ELECTRODE CAUTERY BLDE TIP 2.5 (TIP) ×1 IMPLANT
ELECTRODE EZSTD 165MM 6.5IN (MISCELLANEOUS) IMPLANT
ELECTRODE REM PT RTRN 9FT ADLT (ELECTROSURGICAL) ×1 IMPLANT
GLOVE BIOGEL PI IND STRL 6.5 (GLOVE) ×1 IMPLANT
GLOVE BIOGEL PI INDICATOR 6.5 (GLOVE) ×1
GLOVE SURG SYN 6.5 ES PF (GLOVE) ×4 IMPLANT
GLOVE SURG SYN 6.5 PF PI (GLOVE) ×2 IMPLANT
GLOVE SURG SYN 8.5  E (GLOVE) ×3
GLOVE SURG SYN 8.5 E (GLOVE) ×3 IMPLANT
GLOVE SURG SYN 8.5 PF PI (GLOVE) ×3 IMPLANT
GLOVE SURG UNDER POLY LF SZ8.5 (GLOVE) ×2 IMPLANT
GOWN SRG LRG LVL 4 IMPRV REINF (GOWNS) ×1 IMPLANT
GOWN SRG XL LVL 3 NONREINFORCE (GOWNS) ×1 IMPLANT
GOWN STRL NON-REIN TWL XL LVL3 (GOWNS) ×1
GOWN STRL REIN LRG LVL4 (GOWNS) ×1
GRADUATE 1200CC STRL 31836 (MISCELLANEOUS) ×2 IMPLANT
HEMOVAC 400CC 10FR (MISCELLANEOUS) ×1 IMPLANT
KIT SPINAL PRONEVIEW (KITS) ×2 IMPLANT
MANIFOLD NEPTUNE II (INSTRUMENTS) ×2 IMPLANT
MARKER SKIN DUAL TIP RULER LAB (MISCELLANEOUS) ×4 IMPLANT
NDL SAFETY ECLIPSE 18X1.5 (NEEDLE) ×1 IMPLANT
NEEDLE HYPO 18GX1.5 SHARP (NEEDLE) ×1
NEEDLE HYPO 22GX1.5 SAFETY (NEEDLE) ×2 IMPLANT
NS IRRIG 1000ML POUR BTL (IV SOLUTION) ×2 IMPLANT
PACK LAMINECTOMY NEURO (CUSTOM PROCEDURE TRAY) ×2 IMPLANT
PAD ARMBOARD 7.5X6 YLW CONV (MISCELLANEOUS) ×2 IMPLANT
SOLUTION IRRIG SURGIPHOR (IV SOLUTION) ×2 IMPLANT
SURGIFLO W/THROMBIN 8M KIT (HEMOSTASIS) ×2 IMPLANT
SUT DVC VLOC 3-0 CL 6 P-12 (SUTURE) ×2 IMPLANT
SUT ETHILON 3 0 FSL (SUTURE) ×1 IMPLANT
SUT VIC AB 0 CT1 27 (SUTURE) ×1
SUT VIC AB 0 CT1 27XCR 8 STRN (SUTURE) ×1 IMPLANT
SUT VIC AB 2-0 CT1 18 (SUTURE) ×2 IMPLANT
SYR 10ML LL (SYRINGE) ×2 IMPLANT
SYR 20ML LL LF (SYRINGE) ×2 IMPLANT
SYR 30ML LL (SYRINGE) ×4 IMPLANT
SYR 3ML LL SCALE MARK (SYRINGE) ×2 IMPLANT
TOWEL OR 17X26 4PK STRL BLUE (TOWEL DISPOSABLE) ×6 IMPLANT
TUBING CONNECTING 10 (TUBING) ×2 IMPLANT

## 2021-07-16 NOTE — Progress Notes (Signed)
  Progress Note   Patient: Christy Ray VEH:[id number] DOB: 1956-02-02 DOA: 07/12/2021     4 DOS: the patient was seen and examined on 07/16/2021   Brief hospital course: BHUMI GODBEY is a 66 y.o. 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  It started from the left great toe about 4 to 5 days prior to admission but symptoms progressively worsened.   She was admitted to the hospital for left foot cellulitis.  Blood culture showed Staph aureus.  Staph aureus bacteremia. MRI scan showed epidural abscess involving T7-12, L3-5.  Left psoas abscess.  Assessment and Plan:  Left foot cellulitis with left fifth metatarsal septic joint. MRSA bacteremia. 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 Left psoas abscess. I reviewed the chart, patient did not meet septic criteria at time of admission. Patient will be receiving draining of psoas abscess by IR, followed by epidural wash of abscess by neurosurgery. Blood culture came back with MRSA.  Continue daptomycin and the guidance of ID. I will repeat another 2 sets of blood cultures tomorrow.   Dermatomyositis Immunocompromised. Patient was taking prednisone and CellCept chronically.  She is immunocompromised from this.  We will continue hold off CellCept.  Prednisone is continued.   Hypokalemia. Recheck BMP and magnesium tomorrow     Subjective:  Patient still complaining significant back pain, nausea without vomiting.  Some constipation.  No shortness of breath.  Physical Exam: Vitals:   07/16/21 1315 07/16/21 1320 07/16/21 1330 07/16/21 1345  BP: 126/71 124/78 126/74 128/74  Pulse: (!) 111 (!) 101 99 94  Resp: (!) 25 15 17  (!) 22  Temp:      TempSrc:      SpO2: 97% 96% 96% 97%  Weight:      Height:       General exam: Appears calm and comfortable   Respiratory system: Clear to auscultation. Respiratory effort normal. Cardiovascular system: S1 & S2 heard, RRR. No JVD, murmurs, rubs, gallops or clicks. No pedal edema. Gastrointestinal system: Abdomen is nondistended, soft and nontender. No organomegaly or masses felt. Normal bowel sounds heard. Central nervous system: Alert and oriented. No focal neurological deficits. Extremities: Symmetric 5 x 5 power. Skin: No rashes, lesions or ulcers Psychiatry: Judgement and insight appear normal. Mood & affect appropriate.   Data Reviewed:  Lab results reviewed  Family Communication: Sister updated at the bedside.  Disposition: Status is: Inpatient Remains inpatient appropriate because: Severity of disease, IV antibiotics, multiple inpatient procedures.  Planned Discharge Destination: Home with Home Health    Time spent: 35 minutes  Author: , MD 07/16/2021 1:57 PM  For on call review [email or web address].

## 2021-07-16 NOTE — Progress Notes (Signed)
Daily Progress Note   Subjective  - 2 Days Post-Op  Postop day 2 status post excision of left metatarsophalangeal joint with antibiotic bead placement.  She states she is doing well.  Minimal pain to the left foot.  Patient complaining of significant pain noted to the lower back and groin area.  Patient is undergoing procedures today to drain abscesses.  Objective Vitals:   07/15/21 2330 07/16/21 0457 07/16/21 0831 07/16/21 1224  BP: 133/85 131/81 132/85 126/80  Pulse: 99 (!) 103 (!) 103 (!) 103  Resp: 20 20  15   Temp: 99.6 F (37.6 C) 99.4 F (37.4 C) 98.8 F (37.1 C)   TempSrc:      SpO2: 98% 96% 96% 98%  Weight:      Height:        Physical Exam: Left foot incision sites appear to be well coapted with sutures intact, no active drainage today present, no purulence appears to be stable with reduction in erythema and edema compared to preoperative state.  Toe still appears to sit in a fairly stable position.   Laboratory CBC    Component Value Date/Time   WBC 20.0 (H) 07/16/2021 0617   HGB 10.9 (L) 07/16/2021 0617   HCT 31.9 (L) 07/16/2021 0617   PLT 227 07/16/2021 0617    BMET    Component Value Date/Time   NA 135 07/15/2021 0429   K 4.7 07/15/2021 0429   CL 102 07/15/2021 0429   CO2 26 07/15/2021 0429   GLUCOSE 105 (H) 07/15/2021 0429   BUN 18 07/15/2021 0429   CREATININE 0.50 07/15/2021 0429   CALCIUM 8.0 (L) 07/15/2021 0429   GFRNONAA >60 07/15/2021 0429    Assessment/Planning: Sepsis secondary to multiple joint infections/abscesses 2. Status post resection left first metatarsophalangeal joint with antibiotic bead placement. 3. Immunocompromise patient  Dressing changed today.  Incision sites looks really good.  No purulent drainage noted.  Preliminary cultures growing Staph aureus likely consistent with same bacteria and blood.  Blood culture showed MRSA bacteremia.  Infectious diseases involved.  Appreciate recommendations. Okay for weightbearing to her  heel.  Try to minimize ambulation. Will follow more peripherally at this time as infection appears to be well under control to the first metatarsal phalangeal joint area. Discussed with patient long-term that she may require a left hallux amputation versus partial first ray amputation but we will continue to try to treat this conservatively to try to save the toe.  Patient appreciates.  Caroline More, DPM  07/16/2021, 12:32 PM

## 2021-07-16 NOTE — Procedures (Signed)
Interventional Radiology Procedure Note  Date of Procedure: 07/16/2021  Procedure: CT drain placement   Findings:  1. CT drain placement of 12 Fr drain into left psoas abscess    Complications: No immediate complications noted.   Estimated Blood Loss: minimal  Follow-up and Recommendations: 1. F/u cultures    Olive Bass, MD  Vascular & Interventional Radiology  07/16/2021 1:18 PM

## 2021-07-16 NOTE — Progress Notes (Signed)
    Attending Progress Note  History: Christy Ray is here for MRSA bacteremia, L great toe septic arthritis, and epidural abscess with psoas abscess.  07/16/21: Pain overnight.    Physical Exam: Vitals:   07/15/21 2330 07/16/21 0457  BP: 133/85 131/81  Pulse: 99 (!) 103  Resp: 20 20  Temp: 99.6 F (37.6 C) 99.4 F (37.4 C)  SpO2: 98% 96%    AA Ox3 CNI  Strength:5/5 throughout BLE  Data:  Recent Labs  Lab 07/12/21 0850 07/13/21 0259 07/14/21 0424 07/15/21 0429  NA 137 138  --  135  K 2.6* 3.3*   < > 4.7  CL 99 102  --  102  CO2 26 26  --  26  BUN 28* 19  --  18  CREATININE 0.69 0.52  --  0.50  GLUCOSE 117* 110*  --  105*  CALCIUM 9.0 8.7*  --  8.0*   < > = values in this interval not displayed.   Recent Labs  Lab 07/12/21 0850  AST 20  ALT 39  ALKPHOS 384*     Recent Labs  Lab 07/14/21 0424 07/15/21 0429 07/16/21 0617  WBC 26.0* 24.7* 20.0*  HGB 12.4 10.5* 10.9*  HCT 35.1* 30.4* 31.9*  PLT 175 189 227   Recent Labs  Lab 07/16/21 0617  INR 1.2         Other tests/results: n/a  Assessment/Plan:  [name] is stable with multiple sites of MRSA infection  - mobilize - pain control - DVT prophylaxis held - OR today for washout   Meade Maw MD, [facility] Department of Neurosurgery

## 2021-07-16 NOTE — Transfer of Care (Signed)
Immediate Anesthesia Transfer of Care Note  Patient: Christy Ray  Procedure(s) Performed: LUMBAR LAMINECTOMY FOR EPIDURAL ABSCESS (Spine Lumbar)  Patient Location: PACU  Anesthesia Type:General  Level of Consciousness: drowsy  Airway & Oxygen Therapy: Patient Spontanous Breathing and Patient connected to face mask oxygen  Post-op Assessment: Report given to RN, Post -op Vital signs reviewed and stable and Patient moving all extremities  Post vital signs: Reviewed and stable  Last Vitals:  Vitals Value Taken Time  BP 99/69 07/16/21 1630  Temp    Pulse 106 07/16/21 1636  Resp 11 07/16/21 1636  SpO2 94 % 07/16/21 1636  Vitals shown include unvalidated device data.  Last Pain:  Vitals:   07/16/21 1443  TempSrc: Oral  PainSc: 4       Patients Stated Pain Goal: 0 [phone]  Complications: No notable events documented.

## 2021-07-16 NOTE — Care Management Important Message (Signed)
Important Message  Patient Details  Name: Christy Ray MRN: [id number] Date of Birth: Mar 12, 1955   Medicare Important Message Given:  Other (see comment)  Patient is in an isolation room and out of the room at the moment so will try again.    Olegario Messier A Anjuli Gemmill 07/16/2021, 1:14 PM

## 2021-07-16 NOTE — Op Note (Signed)
Indications: Mrs. Folk is a 66 yo female who presented with MRSA bacteremia.  She was identified to have an epidural abscess that was symptomatic prompting recommendation for evacuation  Findings: epidural abscess  Preoperative Diagnosis: Epidural Abscess of the lumbar spine Postoperative Diagnosis: same   EBL: 25 ml IVF: see AR ml Drains: 1 placed Disposition: Extubated and Stable to PACU Complications: none  No foley catheter was placed.   Preoperative Note:   Risks of surgery discussed include: infection, bleeding, stroke, coma, death, paralysis, CSF leak, nerve/spinal cord injury, numbness, tingling, weakness, complex regional pain syndrome, recurrent stenosis and/or disc herniation, vascular injury, development of instability, neck/back pain, need for further surgery, persistent symptoms, development of deformity, and the risks of anesthesia. The patient understood these risks and agreed to proceed.  Operative Note:   1. L3-4 lumbar laminotomy for evacuation of epidural hematoma including central laminectomy and bilateral medial facetectomies including foraminotomies  The patient was then brought from the preoperative center with intravenous access established.  The patient underwent general anesthesia and endotracheal tube intubation, and was then rotated on the [place] rail top where all pressure points were appropriately padded.  The skin was then thoroughly cleansed.  Perioperative antibiotic prophylaxis was administered.  Sterile prep and drapes were then applied and a timeout was then observed.  C-arm was brought into the field under sterile conditions and under lateral visualization the L3-4 interspace was identified and marked.  The incision was marked on the right and injected with local anesthetic. Once this was complete a 3 cm incision was opened with the use of a #10 blade knife.    The metrx tubes were sequentially advanced and confirmed in position at L3-4. An 80mm  by 75mm tube was locked in place to the bed side attachment.  The microscope was then sterilely brought into the field and muscle creep was hemostased with a bipolar and resected with a pituitary rongeur.  A Bovie extender was then used to expose the spinous process and lamina.  Careful attention was placed to not violate the facet capsule. A 3 mm matchstick drill bit was then used to make a hemi-laminotomy trough until the ligamentum flavum was exposed.  This was extended to the base of the spinous process and to the contralateral side to remove all the central bone from each side.  Once this was complete and the underlying ligamentum flavum was visualized, it was dissected with a curette and resected with Kerrison rongeurs.     After removal of the ligamentum, the abscess was encountered.  This was cultured then irrigated out until no obvious abscess remained.  The epidural space was irrigated and hemostasis achieved.  A drain was placed.   No CSF leak was noted.  The tube system was then removed under microscopic visualization and hemostasis was obtained with a bipolar.    The fascial layer was reapproximated with the use of a 0 Vicryl suture.  Subcutaneous tissue layer was reapproximated using 2-0 Vicryl suture.  3-0 monocryl was placed in subcuticular fashion. The skin was then cleansed and Dermabond was used to close the skin opening.  Patient was then rotated back to the preoperative bed awakened from anesthesia and taken to recovery all counts are correct in this case.  I performed the entire procedure   Carlis Blanchard K. Izora Ribas MD

## 2021-07-16 NOTE — Progress Notes (Signed)
Date of Admission:  07/12/2021     ID: Christy Ray is a 66 y.o. 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, left (HCC)    Subjective: Seen patient in recovery Had L3-L5 laminotomy and evacuation of epidural abscess Also had left psoas drain placement today   Medications:   [MAR Hold] amLODipine  2.5 mg Oral Daily   [MAR Hold] enoxaparin (LOVENOX) injection  40 mg Subcutaneous Q24H   [MAR Hold] folic acid  1 mg Oral Daily   [MAR Hold] hydrOXYzine  10 mg Oral QHS   [MAR Hold] losartan  100 mg Oral Daily   [MAR Hold] rosuvastatin  10 mg Oral Daily   [MAR Hold] sodium chloride flush  3 mL Intravenous Q12H   [MAR Hold] sodium chloride flush  5 mL Intracatheter Q8H    Objective: Vital signs in last 24 hours: Temp:  [97.8 F (36.6 C)-100.2 F (37.9 C)] 100.2 F (37.9 C) (05/19 1630) Pulse Rate:  [94-111] 106 (05/19 1636) Resp:  [9-25] 11 (05/19 1636) BP: (99-133)/(57-85) 99/69 (05/19 1630) SpO2:  [94 %-100 %] 94 % (05/19 1636)  LDA None  PHYSICAL EXAM:  General: sleepy from sedation effect- but repsonds appropriately No distress.  Lungs: b/l air entry. Heart: Regular rate and rhythm, no murmur, rub or gallop. Abdomen: Soft, non-tender,not distended. Bowel sounds normal. No masses Extremities: Left foot surgical dressing not removed Drains 3 in the back Skin: No rashes or lesions. Or bruising Lymph: Cervical, supraclavicular normal. Neurologic: Grossly non-focal  Lab Results Recent Labs    07/14/21 0424 07/15/21 0429 07/16/21 0617  WBC 26.0* 24.7* 20.0*  HGB 12.4 10.5* 10.9*  HCT 35.1* 30.4* 31.9*  NA  --  135  --   K 3.5 4.7  --   CL  --  102  --   CO2  --  26  --   BUN  --  18  --   CREATININE  --  0.50  --     Microbiology: 07/12/2021 blood culture  MRSA 07/14/2021 blood culture MRSA 07/14/2021 abscess culture Staph aureus Studies/Results: DG Lumbar Spine 2-3 Views  Result Date: 07/16/2021 CLINICAL DATA:  Post lumbar laminectomy reportedly for abscess. EXAM: LUMBAR SPINE - 2-3 VIEW; DG C-ARM 1-60 MIN-NO REPORT COMPARISON:  CT-guided drain placement into psoas abscess, images from Jul 16, 2021. FINDINGS: Limited intraoperative fluoroscopic views are provided. Total of 4 fluoroscopic images. Initial image shows a radiopaque indicator directed at the L4 level and a pigtail drain projecting over L5. Subsequent image shows indicator directed into the soft tissues tip overlying posterior elements also at the L4 level. Placement of a port at the level of the L4 pedicle on subsequent image shown on the RIGHT, pigtail drain in LEFT psoas. Surgical instrument directed through the port. Hemostats project over the lower pelvis on the final image. IMPRESSION: 1. Intraoperative fluoroscopic localization of the L4 level. Signs of pigtail drain in the contralateral psoas muscle 2. Fluoroscopic time: 3 seconds 3. Fluoroscopic dose: 1.225 mGy. Electronically Signed   By: Donzetta Kohut M.D.   On: 07/16/2021 15:42   MR THORACIC SPINE W WO CONTRAST  Result Date: 07/14/2021 CLINICAL DATA:  Back pain and MRSA bacteremia EXAM: MRI THORACIC AND LUMBAR SPINE WITHOUT AND WITH CONTRAST TECHNIQUE: Multiplanar and  multiecho pulse sequences of the thoracic and lumbar spine were obtained without and with intravenous contrast. CONTRAST:  6mL GADAVIST GADOBUTROL 1 MMOL/ML IV SOLN COMPARISON:  None Available. FINDINGS: MRI THORACIC SPINE FINDINGS Alignment:  Normal Vertebrae: No fracture, evidence of discitis, or bone lesion. Cord: Spinal cord is normal. However, there is a right is eccentric and dorsal epidural collection extending from T7-T12 with peripheral contrast enhancement. This causes mild attenuation of the thecal sac without spinal cord compression. There is a smaller  component of the collection at the left ventral aspect of the T11 level. Paraspinal and other soft tissues: Negative Disc levels: No discogenic spinal canal stenosis. MRI LUMBAR SPINE FINDINGS Segmentation:  Standard. Alignment:  Grade 1 anterolisthesis at L3-4 Vertebrae: Mild edema at the inferior endplate of L1 is likely degenerative. Conus medullaris: The conus medullaris terminates at the L1-2 disc level. There is mild abnormal contrast enhancement of the cauda equina. Within the dorsal epidural space at L3-5, there is a fluid collection measuring up to 7 mm in thickness. This severely crowds the cauda equina nerve roots in the spinal canal. Paraspinal and other soft tissues: Left psoas abscess measures 1.6 x 8.0 cm. There are small paraspinal abscesses on the left at the L4 level. Disc levels: T12-L1: Small disc bulge without spinal canal stenosis. L1-2: Small disc bulge with endplate spurring.  No stenosis. L2-3: Normal. L3-4: Moderate facet hypertrophy and small disc bulge with moderate spinal canal stenosis. L4-5: Severe facet arthrosis and small disc bulge with moderate spinal canal stenosis. No neural foraminal stenosis. L5-S1: Severe facet arthrosis without spinal canal or neural foraminal stenosis. IMPRESSION: 1. 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 There is moderate attenuation of the thecal sac. 2. Left psoas abscess and small left posterior paraspinal muscle abscess. Electronically Signed   By: Deatra RobinsonKevin  Herman M.D.   On: 07/14/2021 22:54   MR Lumbar Spine W Wo Contrast  Result Date: 07/14/2021 CLINICAL DATA:  Back pain and MRSA bacteremia EXAM: MRI THORACIC AND LUMBAR SPINE WITHOUT AND WITH CONTRAST TECHNIQUE: Multiplanar and multiecho pulse sequences of the thoracic and lumbar spine were obtained without and with intravenous contrast. CONTRAST:  6mL GADAVIST GADOBUTROL 1 MMOL/ML IV SOLN COMPARISON:  None Available. FINDINGS: MRI THORACIC SPINE FINDINGS Alignment:  Normal Vertebrae:  No fracture, evidence of discitis, or bone lesion. Cord: Spinal cord is normal. However, there is a right is eccentric and dorsal epidural collection extending from T7-T12 with peripheral contrast enhancement. This causes mild attenuation of the thecal sac without spinal cord compression. There is a smaller component of the collection at the left ventral aspect of the T11 level. Paraspinal and other soft tissues: Negative Disc levels: No discogenic spinal canal stenosis. MRI LUMBAR SPINE FINDINGS Segmentation:  Standard. Alignment:  Grade 1 anterolisthesis at L3-4 Vertebrae: Mild edema at the inferior endplate of L1 is likely degenerative. Conus medullaris: The conus medullaris terminates at the L1-2 disc level. There is mild abnormal contrast enhancement of the cauda equina. Within the dorsal epidural space at L3-5, there is a fluid collection measuring up to 7 mm in thickness. This severely crowds the cauda equina nerve roots in the spinal canal. Paraspinal and other soft tissues: Left psoas abscess measures 1.6 x 8.0 cm. There are small paraspinal abscesses on the left at the L4 level. Disc levels: T12-L1: Small disc bulge without spinal canal stenosis. L1-2: Small disc bulge with endplate spurring.  No stenosis. L2-3: Normal. L3-4: Moderate facet hypertrophy and small disc bulge with moderate  spinal canal stenosis. L4-5: Severe facet arthrosis and small disc bulge with moderate spinal canal stenosis. No neural foraminal stenosis. L5-S1: Severe facet arthrosis without spinal canal or neural foraminal stenosis. IMPRESSION: 1. 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 There is moderate attenuation of the thecal sac. 2. Left psoas abscess and small left posterior paraspinal muscle abscess. Electronically Signed   By: Deatra Robinson M.D.   On: 07/14/2021 22:54   DG Foot Complete Left  Result Date: 07/14/2021 CLINICAL DATA:  Postoperative check. EXAM: LEFT FOOT - COMPLETE 3+ VIEW COMPARISON:  Jul 12, 2021. FINDINGS: Status post osteotomy of distal first metatarsal as well as a portion of the first proximal phalanx. Surgically placed beads are noted in the region of the first metatarsophalangeal joint. IMPRESSION: Postsurgical changes as described above. Electronically Signed   By: Lupita Raider M.D.   On: 07/14/2021 18:37   DG C-Arm 1-60 Min-No Report  Result Date: 07/16/2021 Fluoroscopy was utilized by the requesting physician.  No radiographic interpretation.   CT IMAGE GUIDED DRAINAGE BY PERCUTANEOUS CATHETER  Result Date: 07/16/2021 INDICATION: Left psoas abscess EXAM: CT-guided drain placement into left psoas abscess TECHNIQUE: Multidetector CT imaging of the abdomen and pelvis was performed following the standard protocol without IV contrast. RADIATION DOSE REDUCTION: This exam was performed according to the departmental dose-optimization program which includes automated exposure control, adjustment of the mA and/or kV according to patient size and/or use of iterative reconstruction technique. MEDICATIONS: The patient is currently admitted to the hospital and receiving intravenous antibiotics. The antibiotics were administered within an appropriate time frame prior to the initiation of the procedure. ANESTHESIA/SEDATION: Moderate (conscious) sedation was employed during this procedure. A total of Versed 1.5 mg and Fentanyl 75 mcg was administered intravenously by the radiology nurse. Total intra-service moderate Sedation Time: 22 minutes. The patient's level of consciousness and vital signs were monitored continuously by radiology nursing throughout the procedure under my direct supervision. COMPLICATIONS: None immediate. PROCEDURE: Informed written consent was obtained from the patient after a thorough discussion of the procedural risks, benefits and alternatives. All questions were addressed. Maximal Sterile Barrier Technique was utilized including caps, mask, sterile gowns, sterile gloves,  sterile drape, hand hygiene and skin antiseptic. A timeout was performed prior to the initiation of the procedure. The patient was placed prone on the exam table. Limited CT of the abdomen and pelvis was performed for planning purposes, which again demonstrated a hypodense fluid collection within the left psoas muscle concerning for abscess. Skin entry site was marked, the overlying skin was prepped and draped in the standard sterile fashion. Local analgesia was obtained with 1% lidocaine. Using intermittent CT fluoroscopy, a 18 gauge trocar needle was advanced towards the identified fluid collection using a posterior approach. Location was confirmed with CT and return of purulent material. An 035 Amplatz wire was advanced through the needle, over which the percutaneous tract was serially dilated to accommodate a 12 French locking multipurpose drainage catheter. Location was again confirmed with CT and return of additional purulent material. A total of approximately 20 mL of purulent material was aspirated and sent to the lab for microbiology analysis. The drainage catheter was secured to the skin using silk suture and a dressing. It was attached to bulb suction. The patient tolerated the procedure well without immediate complication. IMPRESSION: Successful CT-guided placement of a 12 French drainage catheter into the left psoas abscess. 20 mL of purulent material aspirated, with a sample sent to the lab for microbiology  analysis. Electronically Signed   By: Olive Bass M.D.   On: 07/16/2021 13:40     Assessment/Plan: Disseminated MRSA infection. MRSA bacteremia MRSA septic arthritis of the left great toe underwent irrigation and debridement and first metatarsal phalangeal joint resection.  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  On hold  Hypertension on amlodipine and losartan  Hyperlipidemia on rosuvastatin 10 mg Watch closely CPK as risk for rhabdomyolysis as she is on daptomycin.  Discussed the management with her sister the patient and care team. ID will follow her peripherally this weekend- call if needed

## 2021-07-16 NOTE — Interval H&P Note (Signed)
History and Physical Interval Note:  07/16/2021 2:39 PM  Christy Ray  has presented today for surgery, with the diagnosis of epidural abscess.  The various methods of treatment have been discussed with the patient and family. After consideration of risks, benefits and other options for treatment, the patient has consented to  Procedure(s): LUMBAR LAMINECTOMY FOR EPIDURAL ABSCESS (N/A) as a surgical intervention.  The patient's history has been reviewed, patient examined, no change in status, stable for surgery.  I have reviewed the patient's chart and labs.  Questions were answered to the patient's satisfaction.     Garnet Chatmon

## 2021-07-16 NOTE — Anesthesia Procedure Notes (Signed)
Procedure Name: Intubation Date/Time: 07/16/2021 2:49 PM Performed by: Esaw Grandchild, CRNA Pre-anesthesia Checklist: Patient identified, Emergency Drugs available, Suction available and Patient being monitored Patient Re-evaluated:Patient Re-evaluated prior to induction Oxygen Delivery Method: Circle system utilized Preoxygenation: Pre-oxygenation with 100% oxygen Induction Type: IV induction Ventilation: Mask ventilation without difficulty Laryngoscope Size: Miller and 2 Grade View: Grade I Tube type: Oral Tube size: 7.0 mm Number of attempts: 1 Airway Equipment and Method: Stylet, Oral airway, LTA kit utilized and Bite block Placement Confirmation: ETT inserted through vocal cords under direct vision, positive ETCO2 and breath sounds checked- equal and bilateral Secured at: 21 cm Tube secured with: Tape Dental Injury: Teeth and Oropharynx as per pre-operative assessment

## 2021-07-16 NOTE — Anesthesia Preprocedure Evaluation (Addendum)
Anesthesia Evaluation  Patient identified by MRN, date of birth, ID band Patient awake    Reviewed: Allergy & Precautions, NPO status , Patient's Chart, lab work & pertinent test results  History of Anesthesia Complications Negative for: history of anesthetic complications  Airway Mallampati: III   Neck ROM: Full    Dental  (+) Missing   Pulmonary Current Smoker and Patient abstained from smoking.,    Pulmonary exam normal breath sounds clear to auscultation       Cardiovascular hypertension, Normal cardiovascular exam Rhythm:Regular Rate:Normal  Echo 01/21/20:  NORMAL LEFT VENTRICULAR SYSTOLIC FUNCTION  NORMAL LA PRESSURES WITH NORMAL DIASTOLIC FUNCTION  MILD RV SYSTOLIC DYSFUNCTION  VALVULAR REGURGITATION: MILD AR, TRIVIAL MR, TRIVIAL PR, TRIVIAL TR  NO VALVULAR STENOSIS  TRIVIAL PERICARDIAL EFFUSION  INSUFFICIENT TR TO ESTIMATE RVSP  Compared with prior Echo study on 02/13/2019: NO SIGNIFICANT CHANGES.    Neuro/Psych negative neurological ROS     GI/Hepatic GERD  ,  Endo/Other  negative endocrine ROS  Renal/GU negative Renal ROS     Musculoskeletal  (+) Arthritis , Gout; left foot cellulitis   Abdominal   Peds  Hematology negative hematology ROS (+)   Anesthesia Other Findings   Reproductive/Obstetrics                            Anesthesia Physical  Anesthesia Plan  ASA: 2 and emergent  Anesthesia Plan: General   Post-op Pain Management:    Induction: Intravenous  PONV Risk Score and Plan: 2 and Ondansetron, Dexamethasone and Treatment may vary due to age or medical condition  Airway Management Planned: Oral ETT  Additional Equipment:   Intra-op Plan:   Post-operative Plan: Extubation in OR  Informed Consent: I have reviewed the patients History and Physical, chart, labs and discussed the procedure including the risks, benefits and alternatives for  the proposed anesthesia with the patient or authorized representative who has indicated his/her understanding and acceptance.     Dental advisory given  Plan Discussed with: CRNA  Anesthesia Plan Comments: (Patient consented for risks of anesthesia including but not limited to:  - adverse reactions to medications - damage to eyes, teeth, lips or other oral mucosa - nerve damage due to positioning  - sore throat or hoarseness - damage to heart, brain, nerves, lungs, other parts of body or loss of life  Informed patient about role of CRNA in peri- and intra-operative care.  Patient voiced understanding.)       Anesthesia Quick Evaluation

## 2021-07-17 DIAGNOSIS — L03116 Cellulitis of left lower limb: Secondary | ICD-10-CM | POA: Diagnosis not present

## 2021-07-17 DIAGNOSIS — R7881 Bacteremia: Secondary | ICD-10-CM | POA: Diagnosis not present

## 2021-07-17 DIAGNOSIS — M3390 Dermatopolymyositis, unspecified, organ involvement unspecified: Secondary | ICD-10-CM | POA: Diagnosis not present

## 2021-07-17 DIAGNOSIS — G061 Intraspinal abscess and granuloma: Secondary | ICD-10-CM | POA: Diagnosis not present

## 2021-07-17 LAB — BASIC METABOLIC PANEL
Anion gap: 8 (ref 5–15)
BUN: 16 mg/dL (ref 8–23)
CO2: 24 mmol/L (ref 22–32)
Calcium: 8.2 mg/dL — ABNORMAL LOW (ref 8.9–10.3)
Chloride: 102 mmol/L (ref 98–111)
Creatinine, Ser: 0.43 mg/dL — ABNORMAL LOW (ref 0.44–1.00)
GFR, Estimated: >60 mL/min (ref >60)
Glucose, Bld: 103 mg/dL — ABNORMAL HIGH (ref 70–99)
Potassium: 4.7 mmol/L (ref 3.5–5.1)
Sodium: 134 mmol/L — ABNORMAL LOW (ref 135–145)

## 2021-07-17 LAB — CULTURE, BLOOD (ROUTINE X 2): Special Requests: ADEQUATE

## 2021-07-17 LAB — CBC
HCT: 28.8 % — ABNORMAL LOW (ref 36.0–46.0)
Hemoglobin: 9.7 g/dL — ABNORMAL LOW (ref 12.0–15.0)
MCH: 28.2 pg (ref 26.0–34.0)
MCHC: 33.7 g/dL (ref 30.0–36.0)
MCV: 83.7 fL (ref 80.0–100.0)
Platelets: 219 10*3/uL (ref 150–400)
RBC: 3.44 MIL/uL — ABNORMAL LOW (ref 3.87–5.11)
RDW: 16 % — ABNORMAL HIGH (ref 11.5–15.5)
WBC: 12.9 10*3/uL — ABNORMAL HIGH (ref 4.0–10.5)
nRBC: 0 % (ref 0.0–0.2)

## 2021-07-17 LAB — MAGNESIUM: Magnesium: 2.1 mg/dL (ref 1.7–2.4)

## 2021-07-17 MED ORDER — LACTULOSE 10 GM/15ML PO SOLN
20.0000 g | Freq: Once | ORAL | Status: AC
  Administered 2021-07-17: 20 g via ORAL
  Filled 2021-07-17: qty 30

## 2021-07-17 NOTE — Progress Notes (Signed)
    Attending Progress Note  History: Christy Ray is here for MRSA bacteremia, L great toe septic arthritis, and epidural abscess with psoas abscess. DOS: 07/16/21 - L3-4 laminotomy for abscess evacuation  POD1: Doing well.  Back pain improved.  07/16/21: Pain overnight.    Physical Exam: Vitals:   07/16/21 2343 07/17/21 0433  BP: 118/71 110/82  Pulse: 79 82  Resp: 17 16  Temp: (!) 97.5 F (36.4 C) 98.2 F (36.8 C)  SpO2: 97% 99%    AA Ox3 CNI  Strength:5/5 throughout BLE  Drain 10  Data:  Recent Labs  Lab 07/13/21 0259 07/14/21 0424 07/15/21 0429 07/17/21 0529  NA 138  --  135 134*  K 3.3*   < > 4.7 4.7  CL 102  --  102 102  CO2 26  --  26 24  BUN 19  --  18 16  CREATININE 0.52  --  0.50 0.43*  GLUCOSE 110*  --  105* 103*  CALCIUM 8.7*  --  8.0* 8.2*   < > = values in this interval not displayed.   Recent Labs  Lab 07/12/21 0850  AST 20  ALT 39  ALKPHOS 384*     Recent Labs  Lab 07/15/21 0429 07/16/21 0617 07/17/21 0529  WBC 24.7* 20.0* 12.9*  HGB 10.5* 10.9* 9.7*  HCT 30.4* 31.9* 28.8*  PLT 189 227 219   Recent Labs  Lab 07/16/21 0617  INR 1.2         Other tests/results: n/a  Assessment/Plan:  Christy Ray is stable with multiple sites of MRSA infection. She is doing well after laminotomy for evacuation of epidural abscess.  - mobilize - pain control - DVT prophylaxis ok to restart - PTOT is ok from my standpoint    Venetia Night MD, [facility] Department of Neurosurgery

## 2021-07-17 NOTE — Anesthesia Postprocedure Evaluation (Signed)
Anesthesia Post Note  Patient: Christy Ray  Procedure(s) Performed: LUMBAR LAMINECTOMY FOR EPIDURAL ABSCESS (Spine Lumbar)  Patient location during evaluation: PACU Anesthesia Type: General Level of consciousness: awake and alert Pain management: pain level controlled Vital Signs Assessment: post-procedure vital signs reviewed and stable Respiratory status: spontaneous breathing, nonlabored ventilation, respiratory function stable and patient connected to nasal cannula oxygen Cardiovascular status: blood pressure returned to baseline and stable Postop Assessment: no apparent nausea or vomiting Anesthetic complications: no   No notable events documented.   Last Vitals:  Vitals:   07/17/21 0433 07/17/21 0822  BP: 110/82 126/75  Pulse: 82 93  Resp: 16 17  Temp: 36.8 C 36.6 C  SpO2: 99% 97%    Last Pain:  Vitals:   07/16/21 2350  TempSrc:   PainSc: 0-No pain                 Lenard Simmer

## 2021-07-17 NOTE — Progress Notes (Signed)
  Progress Note   Patient: Christy Ray LOV:[id number] DOB: [birth year] DOA: 07/12/2021     5 DOS: the patient was seen and examined on 07/17/2021   Brief hospital course: Christy Ray is a 66 y.o. 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  It started from the left great toe about 4 to 5 days prior to admission but symptoms progressively worsened.   She was admitted to the hospital for left foot cellulitis.  Blood culture showed Staph aureus.  Staph aureus bacteremia. MRI scan showed epidural abscess involving T7-12, L3-5.  Left psoas abscess. 5/19.  Epidural abscess drain on L3-5 was done by neurosurgery, thoracic abscess drain was performed by IR.  Assessment and Plan: Left foot cellulitis with left fifth metatarsal septic joint. MRSA bacteremia. 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 Left psoas abscess. I reviewed the chart, patient did not meet septic criteria at time of admission. Patient will be receiving draining of psoas abscess by IR, followed by epidural wash of abscess by neurosurgery. Blood culture came back with MRSA, culture from the drain also came back with Staph aureus. Discussed with ID yesterday, will obtain blood culture again on Sunday. Currently, no plan for perform TEE, as patient TTE did not show any valvular disease or large vegetation. PICC line on Tuesday if 3rd culture came back negative   Dermatomyositis Immunocompromised. Patient was taking prednisone and CellCept chronically.  She is immunocompromised from this.  We will continue hold off CellCept.  Prednisone is continued.   Hypokalemia. Mild hyponatremia. Conditions stable/improved      Subjective:  Patient back pain much improved after surgery.  Still constipated, will give a dose of lactulose. No abdominal pain or  nausea vomiting.  Physical Exam: Vitals:   07/16/21 2040 07/16/21 2343 07/17/21 0433 07/17/21 0822  BP: 112/67 118/71 110/82 126/75  Pulse: 85 79 82 93  Resp: 17 17 16 17   Temp: 97.6 F (36.4 C) (!) 97.5 F (36.4 C) 98.2 F (36.8 C) 97.8 F (36.6 C)  TempSrc:      SpO2: 100% 97% 99% 97%  Weight:      Height:       General exam: Appears calm and comfortable  Respiratory system: Clear to auscultation. Respiratory effort normal. Cardiovascular system: S1 & S2 heard, RRR. No JVD, murmurs, rubs, gallops or clicks. No pedal edema. Gastrointestinal system: Abdomen is nondistended, soft and nontender. No organomegaly or masses felt. Normal bowel sounds heard. Central nervous system: Alert and oriented. No focal neurological deficits. Extremities: Symmetric 5 x 5 power. Skin: No rashes, lesions or ulcers Psychiatry: Judgement and insight appear normal. Mood & affect appropriate.   Data Reviewed:  Lab results reviewed.  Family Communication:   Disposition: Status is: Inpatient Remains inpatient appropriate because: Severity of disease, IV treatment.  Planned Discharge Destination: Home with Home Health    Time spent: 35 minutes  Author: , MD 07/17/2021 12:02 PM  For on call review [email or web address].

## 2021-07-17 NOTE — Evaluation (Signed)
Physical Therapy Evaluation Patient Details Name: Christy Ray MRN: [id number] DOB: 07-May-1955 Today's Date: 07/17/2021  History of Present Illness  Pt is a 66 y/o F admitted on 07/12/21 after presenting to the hospital with c/o painful swelling & redness in L foot that started ~4-5 days prior but progressively worsened. Pt was admitted for L foot cellulitis, blood culture showing staph aureus, & MRI showing epidural abscess involving T7-12 & L3-5 & L psoas abscess. Pt has underwent I&D of L great toe & 1st metatarsal phalangeal joint resection, L psoas abscess cath drain placement, & L3-4 laminotomy for abscess evacuation. 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 PT briefly educated pt on log rolling to maintain back precautions & PWB LLE in post op shoe as well as need to limit ambulation. Pt reports her husband can assist her at d/c & she has DME at home. Pt prefers to use RW vs QC & ambulates with RW & supervision with good ability to weight bear through L heel. Pt negotiates stairs with B rails with supervision as well. At this time, pt voices no concerns re: mobility & d/c & voices she doesn't feel PT needs to return. PT to sign off at this time. Please re-consult if new needs arise.       Recommendations for follow up therapy are one component of a multi-disciplinary discharge planning process, led by the attending physician.  Recommendations may be updated based on patient status, additional functional criteria and insurance authorization.  Follow Up Recommendations Follow physician's recommendations for discharge plan and follow up therapies    Assistance Recommended at Discharge PRN  Patient can return home with the following  Assistance with cooking/housework;Help with stairs or ramp for entrance;Assist for transportation    Equipment Recommendations None recommended  by PT (pt reports she has a w/c & RW she can use at home)  Recommendations for Other Services       Functional Status Assessment Patient has had a recent decline in their functional status and demonstrates the ability to make significant improvements in function in a reasonable and predictable amount of time.     Precautions / Restrictions Precautions Precautions: Back Restrictions Weight Bearing Restrictions: Yes LLE Weight Bearing: Partial weight bearing (in post op shoe, heel weight bearing, minimize ambulation)      Mobility  Bed Mobility Overal bed mobility: Modified Independent             General bed mobility comments: cuing for rolling to side & coming to sitting upright, is able to do so with bed flat, bed rails PRN (educated pt that she should log roll for bed mobility)    Transfers Overall transfer level: Needs assistance Equipment used: Rolling walker (2 wheels) Transfers: Sit to/from Stand Sit to Stand: Supervision           General transfer comment: education for safe hand placement during STS with RW    Ambulation/Gait Ambulation/Gait assistance: Supervision Gait Distance (Feet): 15 Feet (+ 5 ft + 15 ft) Assistive device: Rolling walker (2 wheels) Gait Pattern/deviations: Decreased step length - right, Decreased step length - left, Decreased stride length Gait velocity: decreased        Stairs Stairs: Yes Stairs assistance: Supervision Stair Management: Two rails, Step to pattern Number of Stairs: 8 General stair comments: PT provides education & demo re: compensatory pattern with pt return demonstrating.  Wheelchair Mobility  Modified Rankin (Stroke Patients Only)       Balance Overall balance assessment: Mild deficits observed, not formally tested (2/2 PWB LLE with post op shoe)                                           Pertinent Vitals/Pain Pain Assessment Pain Assessment: 0-10 Pain Score: 4  Pain Location:  back incision Pain Descriptors / Indicators: Discomfort Pain Intervention(s): Monitored during session    Home Living Family/patient expects to be discharged to:: Private residence Living Arrangements: Spouse/significant other Available Help at Discharge: Family;Available PRN/intermittently Type of Home: House Home Access: Stairs to enter Entrance Stairs-Rails: Right;Left;Can reach both Entrance Stairs-Number of Steps: 3   Home Layout: One level Home Equipment: Agricultural consultantolling Walker (2 wheels);Wheelchair - [employer];Cane - quad      Prior Function Prior Level of Function : Independent/Modified Independent;Driving             Mobility Comments: Ambulatory without AD       Hand Dominance        Extremity/Trunk Assessment   Upper Extremity Assessment Upper Extremity Assessment: Overall WFL for tasks assessed    Lower Extremity Assessment Lower Extremity Assessment: Overall WFL for tasks assessed       Communication   Communication: No difficulties  Cognition Arousal/Alertness: Awake/alert Behavior During Therapy: WFL for tasks assessed/performed Overall Cognitive Status: Within Functional Limits for tasks assessed                                          General Comments General comments (skin integrity, edema, etc.): Pt able to don post op shoe with education re: how.    Exercises     Assessment/Plan    PT Assessment Patient does not need any further PT services  PT Problem List         PT Treatment Interventions      PT Goals (Current goals can be found in the Care Plan section)  Acute Rehab PT Goals Patient Stated Goal: go home PT Goal Formulation: With patient Time For Goal Achievement: 07/31/21 Potential to Achieve Goals: Good    Frequency       Co-evaluation               AM-PAC PT "6 Clicks" Mobility  Outcome Measure Help needed turning from your back to your side while in a flat bed without using bedrails?: None Help  needed moving from lying on your back to sitting on the side of a flat bed without using bedrails?: None Help needed moving to and from a bed to a chair (including a wheelchair)?: None Help needed standing up from a chair using your arms (e.g., wheelchair or bedside chair)?: None Help needed to walk in hospital room?: None Help needed climbing 3-5 steps with a railing? : A Little 6 Click Score: 23    End of Session Equipment Utilized During Treatment:  (LLE post op shoe) Activity Tolerance: Patient tolerated treatment well Patient left: in chair;with call bell/phone within reach Nurse Communication: Mobility status      Time: 1010-1046 PT Time Calculation (min) (ACUTE ONLY): 36 min   Charges:   PT Evaluation $PT Eval Low Complexity: 1 Low PT Treatments $Gait Training: 8-22 mins $Therapeutic Activity: 8-22 mins  Aleda Grana, PT, DPT 07/17/21, 1:08 PM   Sandi Mariscal 07/17/2021, 1:06 PM

## 2021-07-18 ENCOUNTER — Encounter: Payer: Self-pay | Admitting: Neurosurgery

## 2021-07-18 DIAGNOSIS — G061 Intraspinal abscess and granuloma: Secondary | ICD-10-CM | POA: Diagnosis not present

## 2021-07-18 DIAGNOSIS — M3390 Dermatopolymyositis, unspecified, organ involvement unspecified: Secondary | ICD-10-CM | POA: Diagnosis not present

## 2021-07-18 DIAGNOSIS — L03116 Cellulitis of left lower limb: Secondary | ICD-10-CM | POA: Diagnosis not present

## 2021-07-18 DIAGNOSIS — R7881 Bacteremia: Secondary | ICD-10-CM | POA: Diagnosis not present

## 2021-07-18 MED ORDER — SENNOSIDES-DOCUSATE SODIUM 8.6-50 MG PO TABS
2.0000 | ORAL_TABLET | Freq: Two times a day (BID) | ORAL | Status: DC
  Administered 2021-07-18 – 2021-07-20 (×5): 2 via ORAL
  Filled 2021-07-18 (×5): qty 2

## 2021-07-18 MED ORDER — BISACODYL 10 MG RE SUPP
10.0000 mg | Freq: Once | RECTAL | Status: AC
  Administered 2021-07-18: 10 mg via RECTAL
  Filled 2021-07-18: qty 1

## 2021-07-18 MED ORDER — LACTULOSE 10 GM/15ML PO SOLN
20.0000 g | Freq: Once | ORAL | Status: AC
  Administered 2021-07-18: 20 g via ORAL
  Filled 2021-07-18: qty 30

## 2021-07-18 NOTE — Progress Notes (Signed)
    Attending Progress Note  History: Christy Ray is here for MRSA bacteremia, L great toe septic arthritis, and epidural abscess with psoas abscess. DOS: 07/16/21 - L3-4 laminotomy for abscess evacuation  POD2: Doing well.    POD1: Doing well.  Back pain improved.  07/16/21: Pain overnight.    Physical Exam: Vitals:   07/18/21 0409 07/18/21 0841  BP: 130/79 120/73  Pulse: (!) 104 84  Resp: 16 18  Temp: 99.3 F (37.4 C) 97.9 F (36.6 C)  SpO2: 98% 100%    AA Ox3 CNI  Strength:5/5 throughout BLE  Drain 50  Data:  Recent Labs  Lab 07/13/21 0259 07/14/21 0424 07/15/21 0429 07/17/21 0529  NA 138  --  135 134*  K 3.3*   < > 4.7 4.7  CL 102  --  102 102  CO2 26  --  26 24  BUN 19  --  18 16  CREATININE 0.52  --  0.50 0.43*  GLUCOSE 110*  --  105* 103*  CALCIUM 8.7*  --  8.0* 8.2*   < > = values in this interval not displayed.   Recent Labs  Lab 07/12/21 0850  AST 20  ALT 39  ALKPHOS 384*     Recent Labs  Lab 07/15/21 0429 07/16/21 0617 07/17/21 0529  WBC 24.7* 20.0* 12.9*  HGB 10.5* 10.9* 9.7*  HCT 30.4* 31.9* 28.8*  PLT 189 227 219   Recent Labs  Lab 07/16/21 0617  INR 1.2         Other tests/results: n/a  Assessment/Plan:  Christy Ray is stable with multiple sites of MRSA infection. She is doing well after laminotomy for evacuation of epidural abscess.  - mobilize - pain control - DVT prophylaxis ok to restart - PTOT is ok from my standpoint    Venetia Night MD, [facility] Department of Neurosurgery

## 2021-07-18 NOTE — Progress Notes (Signed)
Daily Progress Note   Subjective  - 2 Days Post-Op  Postop day 4 status post excision of left metatarsophalangeal joint with antibiotic bead placement.  She states she is doing well.  Minimal pain to the left foot.  Notes back pain and groin pain is much better. Pt denies n/v/f/c.  Objective Vitals:   07/17/21 1510 07/17/21 2039 07/18/21 0409 07/18/21 0841  BP: 132/69 113/73 130/79 120/73  Pulse: 100 (!) 110 (!) 104 84  Resp: 17 17 16 18   Temp: 98.2 F (36.8 C) 98.6 F (37 C) 99.3 F (37.4 C) 97.9 F (36.6 C)  TempSrc:      SpO2: 100% 97% 98% 100%  Weight:      Height:        Physical Exam: Left foot incision sites appear to be well coapted with sutures intact, no active drainage today present, no purulence appears to be stable with reduction in erythema and edema compared to preoperative state.  Toe still appears to sit in a fairly stable position.   Laboratory CBC    Component Value Date/Time   WBC 12.9 (H) 07/17/2021 0529   HGB 9.7 (L) 07/17/2021 0529   HCT 28.8 (L) 07/17/2021 0529   PLT 219 07/17/2021 0529    BMET    Component Value Date/Time   NA 134 (L) 07/17/2021 0529   K 4.7 07/17/2021 0529   CL 102 07/17/2021 0529   CO2 24 07/17/2021 0529   GLUCOSE 103 (H) 07/17/2021 0529   BUN 16 07/17/2021 0529   CREATININE 0.43 (L) 07/17/2021 0529   CALCIUM 8.2 (L) 07/17/2021 0529   GFRNONAA >60 07/17/2021 0529    Assessment/Planning: 1. Sepsis secondary to multiple joint infections/abscesses 2. Status post resection left first metatarsophalangeal joint with antibiotic bead placement. 3. Immunocompromise patient  Dressing changed today.  Incision sites well coapted.  No purulent drainage noted.  Preliminary cultures growing Staph aureus likely consistent with same bacteria and blood.  Blood culture showed MRSA bacteremia.  Infectious diseases involved.  Appreciate recommendations. Okay for weightbearing to her heel.  Try to minimize ambulation. Discussed with  patient long-term that she may require a left hallux amputation versus partial first ray amputation but we will continue to try to treat this conservatively to try to save the toe.  Patient appreciates. Pt to keep dressings clean, dry, and intact for next week and will see in clinic for change.  Podiatry team signing off at this time.  Reconsult if problems arise.  07/19/2021, DPM  07/18/2021, 3:13 PM

## 2021-07-18 NOTE — Progress Notes (Signed)
  Progress Note   Patient: Christy Ray DOB: Feb 08, 1956 DOA: 07/12/2021     6 DOS: the patient was seen and examined on 07/18/2021   Brief hospital course: Christy Ray is a 66 y.o. 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  It started from the left great toe about 4 to 5 days prior to admission but symptoms progressively worsened.   She was admitted to the hospital for left foot cellulitis.  Blood culture showed Staph aureus.  Staph aureus bacteremia. MRI scan showed epidural abscess involving T7-12, L3-5.  Left psoas abscess. 5/19.  Epidural abscess drain on L3-5 was done by neurosurgery, lumbar abscess drain was performed by IR.  Assessment and Plan: Left foot cellulitis with left fifth metatarsal septic joint. MRSA bacteremia. 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 Left psoas abscess. I reviewed the chart, patient did not meet septic criteria at time of admission. Patient will be receiving draining of psoas abscess by IR, followed by epidural wash of abscess by neurosurgery. Blood culture came back with MRSA, culture from the drain also came back with Staph aureus. Patient condition much improved, leukocytosis much better.  Continue daptomycin for now.  Repeat culture sent out today, will follow on the results.  PICC line on Tuesday if negative.  6 to 8 weeks of IV antibiotics.   Dermatomyositis Immunocompromised. Patient was taking prednisone and CellCept chronically.  She is immunocompromised from this.  We will continue hold off CellCept.  Prednisone is continued.   Hypokalemia. Mild hyponatremia. Condition has improved.  Constipation. Give another dose of lactulose, continue senna, also added Dulcolax per rectum      Subjective:  Patient feels much better, back pain is  essentially resolved.  Still has some constipation, no bowel movement after lactulose. No abdominal pain or nausea vomiting.  Physical Exam: Vitals:   07/17/21 1510 07/17/21 2039 07/18/21 0409 07/18/21 0841  BP: 132/69 113/73 130/79 120/73  Pulse: 100 (!) 110 (!) 104 84  Resp: 17 17 16 18   Temp: 98.2 F (36.8 C) 98.6 F (37 C) 99.3 F (37.4 C) 97.9 F (36.6 C)  TempSrc:      SpO2: 100% 97% 98% 100%  Weight:      Height:       General exam: Appears calm and comfortable  Respiratory system: Clear to auscultation. Respiratory effort normal. Cardiovascular system: S1 & S2 heard, RRR. No JVD, murmurs, rubs, gallops or clicks. No pedal edema. Gastrointestinal system: Abdomen is nondistended, soft and nontender. No organomegaly or masses felt. Normal bowel sounds heard. Central nervous system: Alert and oriented. No focal neurological deficits. Extremities: Symmetric 5 x 5 power. Skin: No rashes, lesions or ulcers Psychiatry: Judgement and insight appear normal. Mood & affect appropriate.   Data Reviewed:  No new Labs today  Family Communication:   Disposition: Status is: Inpatient Remains inpatient appropriate because: Severity of disease, IV treatment.  Planned Discharge Destination: Home with Home Health    Time spent: 35 minutes  Author: , MD 07/18/2021 12:59 PM  For on call review [email or web address].

## 2021-07-19 DIAGNOSIS — R7881 Bacteremia: Secondary | ICD-10-CM | POA: Diagnosis not present

## 2021-07-19 DIAGNOSIS — L03116 Cellulitis of left lower limb: Secondary | ICD-10-CM | POA: Diagnosis not present

## 2021-07-19 DIAGNOSIS — M00072 Staphylococcal arthritis, left ankle and foot: Secondary | ICD-10-CM | POA: Diagnosis not present

## 2021-07-19 DIAGNOSIS — M3390 Dermatopolymyositis, unspecified, organ involvement unspecified: Secondary | ICD-10-CM | POA: Diagnosis not present

## 2021-07-19 DIAGNOSIS — G061 Intraspinal abscess and granuloma: Secondary | ICD-10-CM | POA: Diagnosis not present

## 2021-07-19 LAB — AEROBIC/ANAEROBIC CULTURE W GRAM STAIN (SURGICAL/DEEP WOUND)
Gram Stain: NONE SEEN
Gram Stain: NONE SEEN

## 2021-07-19 LAB — CREATININE, SERUM
Creatinine, Ser: 0.46 mg/dL (ref 0.44–1.00)
GFR, Estimated: >60 mL/min (ref >60)

## 2021-07-19 LAB — SURGICAL PATHOLOGY

## 2021-07-19 LAB — CULTURE, BLOOD (ROUTINE X 2)
Culture: NO GROWTH
Special Requests: ADEQUATE

## 2021-07-19 NOTE — Discharge Instructions (Signed)
Your surgeon has performed an operation on your lumbar spine (low back) to relieve pressure on one or more nerves. Many times, patients feel better immediately after surgery and can "overdo it." Even if you feel well, it is important that you follow these activity guidelines. If you do not let your back heal properly from the surgery, you can increase the chance of a disc herniation and/or return of your symptoms. The following are instructions to help in your recovery once you have been discharged from the hospital.   Activity    No bending, lifting, or twisting ("BLT"). Avoid lifting objects heavier than 10 pounds (gallon milk jug).  Where possible, avoid household activities that involve lifting, bending, pushing, or pulling such as laundry, vacuuming, grocery shopping, and childcare. Try to arrange for help from friends and family for these activities while your back heals.  Increase physical activity slowly as tolerated.  Taking short walks is encouraged, but avoid strenuous exercise. Do not jog, run, bicycle, lift weights, or participate in any other exercises unless specifically allowed by your doctor. Avoid prolonged sitting, including car rides.  Talk to your doctor before resuming sexual activity.  You should not drive until cleared by your doctor.  Until released by your doctor, you should not return to work or school.  You should rest at home and let your body heal.   You may shower two days after your surgery.  After showering, lightly dab your incision dry. Do not take a tub bath or go swimming for 3 weeks, or until approved by your doctor at your follow-up appointment.  If you smoke, we strongly recommend that you quit.  Smoking has been proven to interfere with normal healing in your back and will dramatically reduce the success rate of your surgery. Please contact [facility] (800-QUIT-NOW) and use the resources at www.[facility].com for assistance in stopping smoking.  Surgical  Incision   If you have a dressing on your incision, you may remove it three days after your surgery. Keep your incision area clean and dry.  If you have staples or stitches on your incision, you should have a follow up scheduled for removal. If you do not have staples or stitches, you will have steri-strips (small pieces of surgical tape) or Dermabond glue. The steri-strips/glue should begin to peel away within about a week (it is fine if the steri-strips fall off before then). If the strips are still in place one week after your surgery, you may gently remove them.  Diet            You may return to your usual diet. Be sure to stay hydrated.  When to Contact Us  Although your surgery and recovery will likely be uneventful, you may have some residual numbness, aches, and pains in your back and/or legs. This is normal and should improve in the next few weeks.  However, should you experience any of the following, contact us immediately: New numbness or weakness Pain that is progressively getting worse, and is not relieved by your pain medications or rest Bleeding, redness, swelling, pain, or drainage from surgical incision Chills or flu-like symptoms Fever greater than 101.0 F (38.3 C) Problems with bowel or bladder functions Difficulty breathing or shortness of breath Warmth, tenderness, or swelling in your calf  Contact Information During office hours (Monday-Friday 9 am to 5 pm), please call your physician at [phone] After hours and weekends, please call [phone] and speak with the answering service, who will contact the   doctor on call.  If that fails, call the [facility] Operator at [phone] and ask for the Neurosurgery Resident On Call  For a life-threatening emergency, call 911  

## 2021-07-19 NOTE — Progress Notes (Signed)
    Attending Progress Note  History: NATALYA KRENKE is here for MRSA bacteremia, L great toe septic arthritis, and epidural abscess with psoas abscess. DOS: 07/16/21 - L3-4 laminotomy for abscess evacuation  POD3: no complaints this morning.  POD2: Doing well.    POD1: Doing well.  Back pain improved.  07/16/21: Pain overnight.    Physical Exam: Vitals:   07/19/21 0456 07/19/21 0810  BP: 129/74 133/81  Pulse: 78 78  Resp: 16 16  Temp: 97.6 F (36.4 C) 98.2 F (36.8 C)  SpO2: 98% 100%    AA Ox3 CNI  Strength:5/5 throughout BLE  Drain 0  Data:  Recent Labs  Lab 07/13/21 0259 07/14/21 0424 07/15/21 0429 07/17/21 0529 07/19/21 0447  NA 138  --  135 134*  --   K 3.3*   < > 4.7 4.7  --   CL 102  --  102 102  --   CO2 26  --  26 24  --   BUN 19  --  18 16  --   CREATININE 0.52  --  0.50 0.43* 0.46  GLUCOSE 110*  --  105* 103*  --   CALCIUM 8.7*  --  8.0* 8.2*  --    < > = values in this interval not displayed.    No results for input(s): AST, ALT, ALKPHOS in the last 168 hours.  Invalid input(s): TBILI    Recent Labs  Lab 07/15/21 0429 07/16/21 0617 07/17/21 0529  WBC 24.7* 20.0* 12.9*  HGB 10.5* 10.9* 9.7*  HCT 30.4* 31.9* 28.8*  PLT 189 227 219    Recent Labs  Lab 07/16/21 0617  INR 1.2          Other tests/results: n/a  Assessment/Plan:  [name] is stable with multiple sites of MRSA infection. She is doing well after laminotomy for evacuation of epidural abscess.  - mobilize - pain control - DVT prophylaxis ok to restart - PTOT is ok from my standpoint - HV drain removed this morning. Remainder of dispo plan up to ID and hospitalist - f/u outpatient in 2 weeks.  Cooper Render PA-C Department of Neurosurgery

## 2021-07-19 NOTE — Progress Notes (Signed)
   Date of Admission:  07/12/2021     ID: Christy Ray is a 66 y.o. 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, left (HCC)    Subjective: Patient doing well No fever Back pain much better Lumbar drain has been removed from the right side   Medications:   amLODipine  2.5 mg Oral Daily   enoxaparin (LOVENOX) injection  40 mg Subcutaneous Q24H   folic acid  1 mg Oral Daily   hydrOXYzine  10 mg Oral QHS   losartan  100 mg Oral Daily   rosuvastatin  10 mg Oral Daily   senna-docusate  2 tablet Oral BID   sodium chloride flush  3 mL Intravenous Q12H   sodium chloride flush  5 mL Intracatheter Q8H    Objective: Vital signs in last 24 hours: Temp:  [97.6 F (36.4 C)-98.4 F (36.9 C)] 98.2 F (36.8 C) (05/22 0810) Pulse Rate:  [76-106] 78 (05/22 0810) Resp:  [16-18] 16 (05/22 0810) BP: (113-133)/(74-96) 133/81 (05/22 0810) SpO2:  [98 %-100 %] 100 % (05/22 0810)  LDA Left psoas abscess drainage catheter  PHYSICAL EXAM:  General: Awake and alert no distress.  Lungs: b/l air entry. Heart: Regular rate and rhythm, no murmur, rub or gallop. Abdomen: Soft, non-tender,not distended. Bowel sounds normal. No masses Extremities: Left foot surgical dressing not removed Left was muscle drain on the back Skin: No rashes or lesions. Or bruising Lymph: Cervical, supraclavicular normal. Neurologic: Grossly non-focal  Lab Results Recent Labs    07/17/21 0529 07/19/21 0447  WBC 12.9*  --   HGB 9.7*  --   HCT 28.8*  --   NA 134*  --   K 4.7  --   CL 102  --   CO2 24  --   BUN 16  --   CREATININE 0.43* 0.46    Microbiology: 07/12/2021 blood culture MRSA 07/14/2021 blood culture MRSA 07/14/2021 abscess culture Staph aureus Studies/Results: No results  found.   Assessment/Plan: Disseminated MRSA infection. MRSA bacteremia MRSA septic arthritis of the left great toe underwent irrigation and debridement and first metatarsal phalangeal joint resection.  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  Culture repeated on 07/18/2021 and it is pending.  Patient is currently on daptomycin 10mg /kg  Anemia.  Patient is immunocompromised due to CellCept and prednisone she takes for dermatomyositis.  On hold  Hypertension on amlodipine and losartan  Hyperlipidemia on rosuvastatin 10 mg Watch closely CPK as risk for rhabdomyolysis as she is on daptomycin.  Discussed the management with patient and care team.

## 2021-07-19 NOTE — Progress Notes (Signed)
  Progress Note   Patient: Christy Ray EHM:[id number] DOB: 1955/08/18 DOA: 07/12/2021     7 DOS: the patient was seen and examined on 07/19/2021   Brief hospital course: [name] is a 66 y.o. 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  It started from the left great toe about 4 to 5 days prior to admission but symptoms progressively worsened.   She was admitted to the hospital for left foot cellulitis.  Blood culture showed Staph aureus.  Staph aureus bacteremia. MRI scan showed epidural abscess involving T7-12, L3-5.  Left psoas abscess. 5/19.  Epidural abscess drain on L3-5 was done by neurosurgery, lumbar abscess drain was performed by IR.   Assessment and Plan: Left foot cellulitis with left fifth metatarsal septic joint. MRSA bacteremia. 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 Left psoas abscess. I reviewed the chart, patient did not meet septic criteria at time of admission. Patient will be receiving draining of psoas abscess by IR, followed by epidural wash of abscess by neurosurgery. Blood culture came back with MRSA, culture from the drain also came back with Staph aureus. Condition improving, neurosurgery has removed as a lumbar drain.  Patient has a limitation follow-up with left upper 10 pounds for 6 weeks.   Continue daptomycin under the guidance of ID.  Repeat culture so far has no growth. We will place a PICC line tomorrow if still has no growth.  Patient may be able to discharge home tomorrow with IV antibiotics.   Dermatomyositis Immunocompromised. Patient was taking prednisone and CellCept chronically.  She is immunocompromised from this.  We will continue hold off CellCept.  Prednisone is continued.   Hypokalemia. Mild hyponatremia. Recheck lab work tomorrow   Constipation. Had  a large bowel movements after giving lactulose and dulcolax.      Subjective:  Patient doing well today, currently does not have second back pain.  No fever or chills.  Good appetite, had large bowel movement after stool softener  Physical Exam: Vitals:   07/18/21 1948 07/18/21 2334 07/19/21 0456 07/19/21 0810  BP: 113/89 115/75 129/74 133/81  Pulse: 81 76 78 78  Resp: 18 16 16 16   Temp: 97.7 F (36.5 C) 98.4 F (36.9 C) 97.6 F (36.4 C) 98.2 F (36.8 C)  TempSrc:      SpO2: 99% 98% 98% 100%  Weight:      Height:       General exam: Appears calm and comfortable  Respiratory system: Clear to auscultation. Respiratory effort normal. Cardiovascular system: S1 & S2 heard, RRR. No JVD, murmurs, rubs, gallops or clicks. No pedal edema. Gastrointestinal system: Abdomen is nondistended, soft and nontender. No organomegaly or masses felt. Normal bowel sounds heard. Central nervous system: Alert and oriented. No focal neurological deficits. Extremities: Symmetric 5 x 5 power. Skin: No rashes, lesions or ulcers Psychiatry: Judgement and insight appear normal. Mood & affect appropriate.   Data Reviewed:  Blood culture still negative so far  Family Communication: Husband updated at bedside  Disposition: Status is: Inpatient Remains inpatient appropriate because: Severity of disease, IV antibiotics  Planned Discharge Destination: Home with Home Health    Time spent: 36 minutes  Author: , MD 07/19/2021 1:14 PM  For on call review [email or web address].

## 2021-07-20 ENCOUNTER — Inpatient Hospital Stay: Payer: Self-pay

## 2021-07-20 DIAGNOSIS — G061 Intraspinal abscess and granuloma: Secondary | ICD-10-CM | POA: Diagnosis not present

## 2021-07-20 DIAGNOSIS — M3390 Dermatopolymyositis, unspecified, organ involvement unspecified: Secondary | ICD-10-CM | POA: Diagnosis not present

## 2021-07-20 DIAGNOSIS — R7881 Bacteremia: Secondary | ICD-10-CM | POA: Diagnosis not present

## 2021-07-20 DIAGNOSIS — L03116 Cellulitis of left lower limb: Secondary | ICD-10-CM | POA: Diagnosis not present

## 2021-07-20 LAB — CBC
HCT: 25.2 % — ABNORMAL LOW (ref 36.0–46.0)
Hemoglobin: 8.3 g/dL — ABNORMAL LOW (ref 12.0–15.0)
MCH: 27.9 pg (ref 26.0–34.0)
MCHC: 32.9 g/dL (ref 30.0–36.0)
MCV: 84.8 fL (ref 80.0–100.0)
Platelets: 334 10*3/uL (ref 150–400)
RBC: 2.97 MIL/uL — ABNORMAL LOW (ref 3.87–5.11)
RDW: 15.9 % — ABNORMAL HIGH (ref 11.5–15.5)
WBC: 7.3 10*3/uL (ref 4.0–10.5)
nRBC: 0 % (ref 0.0–0.2)

## 2021-07-20 LAB — BASIC METABOLIC PANEL
Anion gap: 8 (ref 5–15)
BUN: 12 mg/dL (ref 8–23)
CO2: 22 mmol/L (ref 22–32)
Calcium: 7.8 mg/dL — ABNORMAL LOW (ref 8.9–10.3)
Chloride: 107 mmol/L (ref 98–111)
Creatinine, Ser: 0.51 mg/dL (ref 0.44–1.00)
GFR, Estimated: >60 mL/min (ref >60)
Glucose, Bld: 86 mg/dL (ref 70–99)
Potassium: 3.6 mmol/L (ref 3.5–5.1)
Sodium: 137 mmol/L (ref 135–145)

## 2021-07-20 LAB — CK: Total CK: 15 U/L — ABNORMAL LOW (ref 38–234)

## 2021-07-20 LAB — FERRITIN: Ferritin: 271 ng/mL (ref 11–307)

## 2021-07-20 LAB — MAGNESIUM: Magnesium: 2.1 mg/dL (ref 1.7–2.4)

## 2021-07-20 LAB — VITAMIN B12: Vitamin B-12: 1195 pg/mL — ABNORMAL HIGH (ref 180–914)

## 2021-07-20 LAB — IRON AND TIBC
Iron: 30 ug/dL (ref 28–170)
Saturation Ratios: 13 % (ref 10.4–31.8)
TIBC: 227 ug/dL — ABNORMAL LOW (ref 250–450)
UIBC: 197 ug/dL

## 2021-07-20 MED ORDER — SODIUM CHLORIDE 0.9% FLUSH: 10.0000 mL | Freq: Two times a day (BID) | INTRAVENOUS | Status: DC

## 2021-07-20 MED ORDER — SODIUM CHLORIDE 0.9% FLUSH: 10.0000 mL | INTRAVENOUS | Status: DC | PRN

## 2021-07-20 MED ORDER — LOSARTAN POTASSIUM 100 MG PO TABS: 100.0000 mg | ORAL_TABLET | Freq: Every day | ORAL | 0 refills | Status: AC

## 2021-07-20 MED ORDER — PREDNISONE 10 MG PO TABS: 10.0000 mg | ORAL_TABLET | Freq: Every day | ORAL | 0 refills | Status: DC

## 2021-07-20 MED ORDER — SENNOSIDES-DOCUSATE SODIUM 8.6-50 MG PO TABS: 2.0000 | ORAL_TABLET | Freq: Two times a day (BID) | ORAL | 0 refills | Status: DC | PRN

## 2021-07-20 MED ORDER — CHLORHEXIDINE GLUCONATE CLOTH 2 % EX PADS: 6.0000 | MEDICATED_PAD | Freq: Every day | CUTANEOUS | Status: DC

## 2021-07-20 MED ORDER — DAPTOMYCIN IV (FOR PTA / DISCHARGE USE ONLY): 600.0000 mg | INTRAVENOUS | 0 refills | Status: AC

## 2021-07-20 NOTE — Discharge Summary (Signed)
Physician Discharge Summary   Patient: Christy Ray MRN: [id number] DOB: Dec 13, 1955  Admit date:     07/12/2021  Discharge date: 07/20/21  Discharge Physician: Sharen Hones   PCP: Wardell Honour, MD   Recommendations at discharge:   Follow-up with PCP in 1 week. Follow-up with infect disease as scheduled. Follow-up with vascular surgery as scheduled. Follow-up with IR as scheduled. Follow-up with podiatry as scheduled.  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 *  Hospital Course: Christy Ray is a 66 y.o. 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  It started from the left great toe about 4 to 5 days prior to admission but symptoms progressively worsened.   She was admitted to the hospital for left foot cellulitis.  Blood culture showed Staph aureus.  Staph aureus bacteremia. MRI scan showed epidural abscess involving T7-12, L3-5.  Left psoas abscess. 5/19.  Epidural abscess drain on L3-5 was done by neurosurgery, lumbar abscess drain was performed by IR.  Assessment and Plan: Left foot cellulitis with left fifth metatarsal septic joint. MRSA bacteremia. 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 Left psoas abscess. I reviewed the chart, patient did not meet septic criteria at time of admission. Patient had received draining of psoas abscess by IR, followed by epidural wash of abscess by neurosurgery. Blood culture came back with MRSA, culture  from the drain also came back with Staph aureus. Condition improving, neurosurgery has removed  lumbar drain.  Patient has a limitation of weight lift of 10 pounds for 6 weeks.  The third repeated blood culture came back negative.  Patient has set up for antibody infusion and follow-up with ID. I also spoke with IR, patient will keep the Crawford drain for now, follow-up with IR as outpatient.   Dermatomyositis Immunocompromised. Patient was taking prednisone and CellCept chronically.  She is immunocompromised from this.  We will continue hold off CellCept.  I will continue prednisone at 10 mg daily the patient has most likely adrenal insufficiency due to long-term steroid use.   Hypokalemia. Mild hyponatremia. Condition had improved.   Constipation. Had a large bowel movements after giving lactulose and dulcolax.          Consultants: ID, IR, neurosurgery, podiatry Procedures performed: Percutaneous drain of abscess. Disposition: Home health Diet recommendation:  Discharge Diet Orders (From admission, onward)     Start     Ordered   07/20/21 0000  Diet - low sodium heart healthy        07/20/21 1238           Cardiac diet DISCHARGE MEDICATION: Allergies as of 07/20/2021   No Known Allergies      Medication List     STOP taking these medications    losartan-hydrochlorothiazide 100-12.5 MG tablet Commonly known as: HYZAAR   methotrexate 2.5 MG tablet Commonly known as: RHEUMATREX   mycophenolate 500 MG tablet Commonly known as: CELLCEPT       TAKE these medications  amLODipine 2.5 MG tablet Commonly known as: NORVASC Take 2.5 mg by mouth daily.   daptomycin  IVPB Commonly known as: CUBICIN Inject 600 mg into the vein daily. Indication:  MRSA bacteremia and multiple abscess First Dose: Yes Last Day of Therapy:  08/29/2021 Labs - Once weekly:  CBC/D, CMP, ESR, CRP and CPK Please pull PIC at completion of IV antibiotics Fax weekly labs to   Dr.Ravishankar [phone] And  [facility] pharmacist at [phone] Method of administration: IV Push Method of administration may be changed at the discretion of home infusion pharmacist based upon assessment of the patient and/or caregiver's ability to self-administer the medication ordered. Start taking on: Jul 22, 2954   folic acid 1 MG tablet Commonly known as: FOLVITE Take 1 mg by mouth daily.   hydrOXYzine 10 MG tablet Commonly known as: ATARAX Take 10 mg by mouth at bedtime.   losartan 100 MG tablet Commonly known as: COZAAR Take 1 tablet (100 mg total) by mouth daily. Start taking on: Jul 21, 2021   predniSONE 10 MG tablet Commonly known as: DELTASONE Take 1 tablet (10 mg total) by mouth daily. What changed: how much to take   rosuvastatin 10 MG tablet Commonly known as: CRESTOR Take 10 mg by mouth daily.   senna-docusate 8.6-50 MG tablet Commonly known as: Senokot-S Take 2 tablets by mouth 2 (two) times daily as needed for mild constipation.               Discharge Care Instructions  (From admission, onward)           Start     Ordered   07/20/21 0000  Change dressing on IV access line weekly and PRN  (Home infusion instructions - [facility] )        07/20/21 1238   07/20/21 0000  Discharge wound care:       Comments: Flush the drain with 5 ml of saline twice a day   07/20/21 1238            Follow-up Information     Caroline More, DPM. Schedule an appointment as soon as possible for a visit in 1 week(s).   Specialty: Podiatry Why: For wound re-check Contact information: [place] [address] [phone]         Wardell Honour, MD Follow up in 1 week(s).   Specialty: Family Medicine Contact information: [place] [address] [phone]         Loleta Dicker, PA Follow up in 2 week(s).   Why: for post-op and incision check. Please call the office for appointment  date and time Contact information: [place] [address] [phone]         Juliet Rude, MD Follow up.   Specialties: Interventional Radiology, Diagnostic Radiology, Radiology Contact information: [place] [address] [place] [address] [phone]                Discharge Exam: Danley Danker Weights   07/12/21 0843 07/14/21 1628  Weight: 60.3 kg 60.3 kg   General exam: Appears calm and comfortable  Respiratory system: Clear to auscultation. Respiratory effort normal. Cardiovascular system: S1 & S2 heard, RRR. No JVD, murmurs, rubs, gallops or clicks. No pedal edema. Gastrointestinal system: Abdomen is nondistended, soft and nontender. No organomegaly or masses felt. Normal bowel sounds heard. Central nervous system: Alert and oriented. No focal neurological deficits. Extremities: Symmetric 5 x 5  power. Skin: No rashes, lesions or ulcers Psychiatry: Judgement and insight appear normal. Mood & affect appropriate.    Condition at discharge: good  The results of significant diagnostics from this hospitalization (including imaging, microbiology, ancillary and laboratory) are listed below for reference.   Imaging Studies: DG Lumbar Spine 2-3 Views  Result Date: 07/16/2021 CLINICAL DATA:  Post lumbar laminectomy reportedly for abscess. EXAM: LUMBAR SPINE - 2-3 VIEW; DG C-ARM 1-60 MIN-NO REPORT COMPARISON:  CT-guided drain placement into psoas abscess, images from Jul 16, 2021. FINDINGS: Limited intraoperative fluoroscopic views are provided. Total of 4 fluoroscopic images. Initial image shows a radiopaque indicator directed at the L4 level and a pigtail drain projecting over L5. Subsequent image shows indicator directed into the soft tissues tip overlying posterior elements also at the L4 level. Placement of a port at the level of the L4 pedicle on subsequent image shown on the RIGHT, pigtail drain in LEFT psoas. Surgical instrument directed  through the port. Hemostats project over the lower pelvis on the final image. IMPRESSION: 1. Intraoperative fluoroscopic localization of the L4 level. Signs of pigtail drain in the contralateral psoas muscle 2. Fluoroscopic time: 3 seconds 3. Fluoroscopic dose: 1.225 mGy. Electronically Signed   By: Zetta Bills M.D.   On: 07/16/2021 15:42   MR THORACIC SPINE W WO CONTRAST  Result Date: 07/14/2021 CLINICAL DATA:  Back pain and MRSA bacteremia EXAM: MRI THORACIC AND LUMBAR SPINE WITHOUT AND WITH CONTRAST TECHNIQUE: Multiplanar and multiecho pulse sequences of the thoracic and lumbar spine were obtained without and with intravenous contrast. CONTRAST:  21m GADAVIST GADOBUTROL 1 MMOL/ML IV SOLN COMPARISON:  None Available. FINDINGS: MRI THORACIC SPINE FINDINGS Alignment:  Normal Vertebrae: No fracture, evidence of discitis, or bone lesion. Cord: Spinal cord is normal. However, there is a right is eccentric and dorsal epidural collection extending from T7-T12 with peripheral contrast enhancement. This causes mild attenuation of the thecal sac without spinal cord compression. There is a smaller component of the collection at the left ventral aspect of the T11 level. Paraspinal and other soft tissues: Negative Disc levels: No discogenic spinal canal stenosis. MRI LUMBAR SPINE FINDINGS Segmentation:  Standard. Alignment:  Grade 1 anterolisthesis at L3-4 Vertebrae: Mild edema at the inferior endplate of L1 is likely degenerative. Conus medullaris: The conus medullaris terminates at the L1-2 disc level. There is mild abnormal contrast enhancement of the cauda equina. Within the dorsal epidural space at L3-5, there is a fluid collection measuring up to 7 mm in thickness. This severely crowds the cauda equina nerve roots in the spinal canal. Paraspinal and other soft tissues: Left psoas abscess measures 1.6 x 8.0 cm. There are small paraspinal abscesses on the left at the L4 level. Disc levels: T12-L1: Small disc bulge  without spinal canal stenosis. L1-2: Small disc bulge with endplate spurring.  No stenosis. L2-3: Normal. L3-4: Moderate facet hypertrophy and small disc bulge with moderate spinal canal stenosis. L4-5: Severe facet arthrosis and small disc bulge with moderate spinal canal stenosis. No neural foraminal stenosis. L5-S1: Severe facet arthrosis without spinal canal or neural foraminal stenosis. IMPRESSION: 1. 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 There is moderate attenuation of the thecal sac. 2. Left psoas abscess and small left posterior paraspinal muscle abscess. Electronically Signed   By: KUlyses JarredM.D.   On: 07/14/2021 22:54   MR Lumbar Spine W Wo Contrast  Result Date: 07/14/2021 CLINICAL DATA:  Back pain and MRSA bacteremia EXAM: MRI THORACIC AND LUMBAR SPINE WITHOUT  AND WITH CONTRAST TECHNIQUE: Multiplanar and multiecho pulse sequences of the thoracic and lumbar spine were obtained without and with intravenous contrast. CONTRAST:  32m GADAVIST GADOBUTROL 1 MMOL/ML IV SOLN COMPARISON:  None Available. FINDINGS: MRI THORACIC SPINE FINDINGS Alignment:  Normal Vertebrae: No fracture, evidence of discitis, or bone lesion. Cord: Spinal cord is normal. However, there is a right is eccentric and dorsal epidural collection extending from T7-T12 with peripheral contrast enhancement. This causes mild attenuation of the thecal sac without spinal cord compression. There is a smaller component of the collection at the left ventral aspect of the T11 level. Paraspinal and other soft tissues: Negative Disc levels: No discogenic spinal canal stenosis. MRI LUMBAR SPINE FINDINGS Segmentation:  Standard. Alignment:  Grade 1 anterolisthesis at L3-4 Vertebrae: Mild edema at the inferior endplate of L1 is likely degenerative. Conus medullaris: The conus medullaris terminates at the L1-2 disc level. There is mild abnormal contrast enhancement of the cauda equina. Within the dorsal epidural space at L3-5, there  is a fluid collection measuring up to 7 mm in thickness. This severely crowds the cauda equina nerve roots in the spinal canal. Paraspinal and other soft tissues: Left psoas abscess measures 1.6 x 8.0 cm. There are small paraspinal abscesses on the left at the L4 level. Disc levels: T12-L1: Small disc bulge without spinal canal stenosis. L1-2: Small disc bulge with endplate spurring.  No stenosis. L2-3: Normal. L3-4: Moderate facet hypertrophy and small disc bulge with moderate spinal canal stenosis. L4-5: Severe facet arthrosis and small disc bulge with moderate spinal canal stenosis. No neural foraminal stenosis. L5-S1: Severe facet arthrosis without spinal canal or neural foraminal stenosis. IMPRESSION: 1. 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 There is moderate attenuation of the thecal sac. 2. Left psoas abscess and small left posterior paraspinal muscle abscess. Electronically Signed   By: KUlyses JarredM.D.   On: 07/14/2021 22:54   MR FOOT LEFT WO CONTRAST  Result Date: 07/14/2021 CLINICAL DATA:  Foot swelling, diabetic, osteomyelitis suspected, xray done EXAM: MRI OF THE LEFT FOOT WITHOUT CONTRAST TECHNIQUE: Multiplanar, multisequence MR imaging of the left forefoot was performed. No intravenous contrast was administered. COMPARISON:  X-ray 07/12/2021 FINDINGS: Technical Note: Despite efforts by the technologist and patient, motion artifact is present on today's exam and could not be eliminated. This reduces exam sensitivity and specificity. Bones/Joint/Cartilage Severe arthropathy of the first MTP joint with complete full-thickness cartilage loss, subchondral sclerosis/cystic change, and bulky marginal osteophyte formation. Possible fragmentation of dorsal osteophyte of the base of the great toe proximal phalanx. Moderate-large first MTP joint effusion, nonspecific. Subchondral marrow edema on both sides of the joint may be degenerative/reactive although changes related to infection would  have a similar appearance. Moderate degenerative changes across the tarsometatarsal joints. Bone marrow edema within the fourth and fifth metatarsal bases and to a lesser degree at the second metatarsal proximal diaphysis are favored to represent a combination of reactive/degenerative changes and stress-related changes. Moderate naviculocuneiform arthropathy. No malalignment. Ligaments Intact Lisfranc ligament.  First MTP joint capsular thickening. Muscles and Tendons Edema-like signal throughout the foot musculature. Focal intramuscular fluid collection within the adductor hallucis muscle adjacent to the first metatarsal diaphysis measuring 3.4 x 1.2 x 1.5 cm (series 5, image 31; series 9, images 13-15). No significant tenosynovial fluid collection. Soft tissues Generalized subcutaneous edema, most pronounced over the dorsum of the forefoot. Additional focal fluid collection adjacent to the medial margin of the first metatarsal neck measuring 1.7 x 0.6 x 1.5 cm.  No ulceration is seen. IMPRESSION: 1. 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 Moderate-large first MTP joint effusion, nonspecific and may be degenerative/reactive, related to an underlying inflammatory arthropathy, or represent septic arthritis. 2. Marrow edema within the first metatarsal head and great toe proximal phalanx could also be reactive/degenerative or secondary to osteomyelitis in the setting of infection. 3. Focal intramuscular fluid collection within the adductor hallucis muscle adjacent to the first metatarsal diaphysis measuring 3.4 x 1.2 x 1.5 cm. Findings are suspicious for intramuscular abscess. 4. Additional probable abscess along the medial margin of the first metatarsal neck measuring up to 1.7 cm. 5. Bone marrow edema within the fourth and fifth metatarsal bases and to a lesser degree at the second metatarsal proximal diaphysis are favored to represent a combination of reactive/degenerative changes and stress-related changes.  Electronically Signed   By: Davina Poke D.O.   On: 07/14/2021 08:28   DG Foot Complete Left  Result Date: 07/14/2021 CLINICAL DATA:  Postoperative check. EXAM: LEFT FOOT - COMPLETE 3+ VIEW COMPARISON:  Jul 12, 2021. FINDINGS: Status post osteotomy of distal first metatarsal as well as a portion of the first proximal phalanx. Surgically placed beads are noted in the region of the first metatarsophalangeal joint. IMPRESSION: Postsurgical changes as described above. Electronically Signed   By: Marijo Conception M.D.   On: 07/14/2021 18:37   DG Foot Complete Left  Result Date: 07/12/2021 CLINICAL DATA:  Left foot pain with erythema and swelling of the MCP joint for 4-5 days. Question gout. EXAM: LEFT FOOT - COMPLETE 3+ VIEW COMPARISON:  None Available. FINDINGS: The mineralization and alignment are normal. There is no evidence of acute fracture or dislocation. 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 No erosive changes or soft tissue calcifications are identified. Mild midfoot degenerative changes. There is mild forefoot soft tissue swelling. IMPRESSION: No acute osseous findings or specific radiographic findings of gout. Advanced degenerative changes at the 1st metatarsophalangeal joint with mild nonspecific forefoot soft tissue swelling. Electronically Signed   By: Richardean Sale M.D.   On: 07/12/2021 09:39   DG C-Arm 1-60 Min-No Report  Result Date: 07/16/2021 Fluoroscopy was utilized by the requesting physician.  No radiographic interpretation.   ECHOCARDIOGRAM COMPLETE  Result Date: 07/14/2021    ECHOCARDIOGRAM REPORT   Patient Name:   Christy Ray Date of Exam: 07/14/2021 Medical Rec #:  [id number]       Height:       65.0 in Accession #:    [id number]      Weight:       133.0 lb Date of Birth:  [birth year]       BSA:          1.663 m Patient Age:    15 years        BP:           168/91 mmHg Patient Gender: F               HR:            102 bpm. Exam Location:  [facility] Procedure: 2D Echo, Cardiac Doppler and Color Doppler Indications:     Bacteremia R78.81  History:         Patient has no prior history of Echocardiogram examinations.                  Risk Factors:Hypertension.  Sonographer:     Sherrie Sport Referring Phys:  [phone]  BERNARD AYIKU Diagnosing Phys: Yolonda Kida MD IMPRESSIONS  1. Left ventricular ejection fraction, by estimation, is >75%. The left ventricle has hyperdynamic function. The left ventricle has no regional wall motion abnormalities. Left ventricular diastolic parameters are consistent with Grade I diastolic dysfunction (impaired relaxation).  2. Right ventricular systolic function is normal. The right ventricular size is normal.  3. The mitral valve is normal in structure. No evidence of mitral valve regurgitation.  4. The aortic valve is grossly normal. Aortic valve regurgitation is not visualized. FINDINGS  Left Ventricle: Left ventricular ejection fraction, by estimation, is >75%. The left ventricle has hyperdynamic function. The left ventricle has no regional wall motion abnormalities. The left ventricular internal cavity size was normal in size. There is no left ventricular hypertrophy. Left ventricular diastolic parameters are consistent with Grade I diastolic dysfunction (impaired relaxation). Right Ventricle: The right ventricular size is normal. No increase in right ventricular wall thickness. Right ventricular systolic function is normal. Left Atrium: Left atrial size was normal in size. Right Atrium: Right atrial size was normal in size. Pericardium: There is no evidence of pericardial effusion. Mitral Valve: The mitral valve is normal in structure. No evidence of mitral valve regurgitation. MV peak gradient, 4.7 mmHg. The mean mitral valve gradient is 2.0 mmHg. Tricuspid Valve: The tricuspid valve is normal in structure. Tricuspid valve regurgitation is not demonstrated. Aortic Valve: The aortic valve is  grossly normal. Aortic valve regurgitation is not visualized. Aortic regurgitation PHT measures 471 msec. Aortic valve mean gradient measures 5.0 mmHg. Aortic valve peak gradient measures 11.0 mmHg. Aortic valve area, by  VTI measures 2.94 cm. Pulmonic Valve: The pulmonic valve was normal in structure. Pulmonic valve regurgitation is not visualized. Aorta: The ascending aorta was not well visualized. IAS/Shunts: No atrial level shunt detected by color flow Doppler.  LEFT VENTRICLE PLAX 2D LVIDd:         4.70 cm   Diastology LVIDs:         2.60 cm   LV e' medial:    6.31 cm/s LV PW:         1.10 cm   LV E/e' medial:  9.2 LV IVS:        0.80 cm   LV e' lateral:   10.90 cm/s LVOT diam:     2.00 cm   LV E/e' lateral: 5.3 LV SV:         77 LV SV Index:   46 LVOT Area:     3.14 cm  RIGHT VENTRICLE RV Basal diam:  3.60 cm RV S prime:     21.40 cm/s TAPSE (M-mode): 2.6 cm LEFT ATRIUM             Index        RIGHT ATRIUM           Index LA diam:        3.10 cm 1.86 cm/m   RA Area:     17.80 cm LA Vol (A2C):   62.1 ml 37.34 ml/m  RA Volume:   47.60 ml  28.62 ml/m LA Vol (A4C):   24.6 ml 14.79 ml/m LA Biplane Vol: 41.0 ml 24.65 ml/m  AORTIC VALVE AV Area (Vmax):    2.42 cm AV Area (Vmean):   3.02 cm AV Area (VTI):     2.94 cm AV Vmax:           166.00 cm/s AV Vmean:          101.000 cm/s  AV VTI:            0.261 m AV Peak Grad:      11.0 mmHg AV Mean Grad:      5.0 mmHg LVOT Vmax:         128.00 cm/s LVOT Vmean:        97.000 cm/s LVOT VTI:          0.244 m LVOT/AV VTI ratio: 0.93 AI PHT:            471 msec  AORTA Ao Root diam: 3.20 cm MITRAL VALVE                TRICUSPID VALVE MV Area (PHT): 6.37 cm     TR Peak grad:   22.3 mmHg MV Area VTI:   4.53 cm     TR Vmax:        236.00 cm/s MV Peak grad:  4.7 mmHg MV Mean grad:  2.0 mmHg     SHUNTS MV Vmax:       1.08 m/s     Systemic VTI:  0.24 m MV Vmean:      67.7 cm/s    Systemic Diam: 2.00 cm MV Decel Time: 119 msec MV E velocity: 57.80 cm/s MV A velocity: 115.00  cm/s MV E/A ratio:  0.50 Dwayne D Callwood MD Electronically signed by Yolonda Kida MD Signature Date/Time: 07/14/2021/1:52:55 PM    Final    CT IMAGE GUIDED DRAINAGE BY PERCUTANEOUS CATHETER  Result Date: 07/16/2021 INDICATION: Left psoas abscess EXAM: CT-guided drain placement into left psoas abscess TECHNIQUE: Multidetector CT imaging of the abdomen and pelvis was performed following the standard protocol without IV contrast. RADIATION DOSE REDUCTION: This exam was performed according to the departmental dose-optimization program which includes automated exposure control, adjustment of the mA and/or kV according to patient size and/or use of iterative reconstruction technique. MEDICATIONS: The patient is currently admitted to the hospital and receiving intravenous antibiotics. The antibiotics were administered within an appropriate time frame prior to the initiation of the procedure. ANESTHESIA/SEDATION: Moderate (conscious) sedation was employed during this procedure. A total of Versed 1.5 mg and Fentanyl 75 mcg was administered intravenously by the radiology nurse. Total intra-service moderate Sedation Time: 22 minutes. The patient's level of consciousness and vital signs were monitored continuously by radiology nursing throughout the procedure under my direct supervision. COMPLICATIONS: None immediate. PROCEDURE: Informed written consent was obtained from the patient after a thorough discussion of the procedural risks, benefits and alternatives. All questions were addressed. Maximal Sterile Barrier Technique was utilized including caps, mask, sterile gowns, sterile gloves, sterile drape, hand hygiene and skin antiseptic. A timeout was performed prior to the initiation of the procedure. The patient was placed prone on the exam table. Limited CT of the abdomen and pelvis was performed for planning purposes, which again demonstrated a hypodense fluid collection within the left psoas muscle concerning for  abscess. Skin entry site was marked, the overlying skin was prepped and draped in the standard sterile fashion. Local analgesia was obtained with 1% lidocaine. Using intermittent CT fluoroscopy, a 18 gauge trocar needle was advanced towards the identified fluid collection using a posterior approach. Location was confirmed with CT and return of purulent material. An 035 Amplatz wire was advanced through the needle, over which the percutaneous tract was serially dilated to accommodate a 12 French locking multipurpose drainage catheter. Location was again confirmed with CT and return of additional purulent material. A total of approximately 20 mL  of purulent material was aspirated and sent to the lab for microbiology analysis. The drainage catheter was secured to the skin using silk suture and a dressing. It was attached to bulb suction. The patient tolerated the procedure well without immediate complication. IMPRESSION: Successful CT-guided placement of a 12 French drainage catheter into the left psoas abscess. 20 mL of purulent material aspirated, with a sample sent to the lab for microbiology analysis. Electronically Signed   By: Albin Felling M.D.   On: 07/16/2021 13:40   [place] EKG SITE RITE  Result Date: 07/20/2021 If Site Rite image not attached, placement could not be confirmed due to current cardiac rhythm.   Microbiology: Results for orders placed or performed during the hospital encounter of 07/12/21  Culture, blood (single)     Status: Abnormal   Collection Time: 07/12/21 10:57 AM   Specimen: BLOOD  Result Value Ref Range Status   Specimen Description   Final    BLOOD LEFT ANTECUBITAL Performed at [facility], [address], [place], [place] [address]    Special Requests   Final    BOTTLES DRAWN AEROBIC AND ANAEROBIC Blood Culture adequate volume Performed at [facility], [address] [place], [place] [address]    Culture  Setup Time   Final    GRAM POSITIVE  COCCI IN BOTH AEROBIC AND ANAEROBIC BOTTLES CRITICAL RESULT CALLED TO, READ BACK BY AND VERIFIED WITH: NATHAN BLUE_0  07/13/21 RH    Culture METHICILLIN RESISTANT STAPHYLOCOCCUS AUREUS (A)  Final   Report Status 07/15/2021 FINAL  Final   Organism ID, Bacteria METHICILLIN RESISTANT STAPHYLOCOCCUS AUREUS  Final      Susceptibility   Methicillin resistant staphylococcus aureus - MIC*    CIPROFLOXACIN <=0.5 SENSITIVE Sensitive     ERYTHROMYCIN >=8 RESISTANT Resistant     GENTAMICIN <=0.5 SENSITIVE Sensitive     OXACILLIN >=4 RESISTANT Resistant     TETRACYCLINE <=1 SENSITIVE Sensitive     VANCOMYCIN 1 SENSITIVE Sensitive     TRIMETH/SULFA <=10 SENSITIVE Sensitive     CLINDAMYCIN <=0.25 SENSITIVE Sensitive     RIFAMPIN <=0.5 SENSITIVE Sensitive     Inducible Clindamycin NEGATIVE Sensitive     * METHICILLIN RESISTANT STAPHYLOCOCCUS AUREUS  Blood Culture ID Panel (Reflexed)     Status: Abnormal   Collection Time: 07/12/21 10:57 AM  Result Value Ref Range Status   Enterococcus faecalis NOT DETECTED NOT DETECTED Final   Enterococcus Faecium NOT DETECTED NOT DETECTED Final   Listeria monocytogenes NOT DETECTED NOT DETECTED Final   Staphylococcus species DETECTED (A) NOT DETECTED Final    Comment: CRITICAL RESULT CALLED TO, READ BACK BY AND VERIFIED WITH: NATHAN BLUE_1  07/13/21 RH    Staphylococcus aureus (BCID) DETECTED (A) NOT DETECTED Final    Comment: Methicillin (oxacillin)-resistant Staphylococcus aureus (MRSA). MRSA is predictably resistant to beta-lactam antibiotics (except ceftaroline). Preferred therapy is vancomycin unless clinically contraindicated. Patient requires contact precautions if  hospitalized. CRITICAL RESULT CALLED TO, READ BACK BY AND VERIFIED WITH: NATHAN BLUE_2  07/13/21 RH    Staphylococcus epidermidis NOT DETECTED NOT DETECTED Final   Staphylococcus lugdunensis NOT DETECTED NOT DETECTED Final   Streptococcus species NOT DETECTED NOT DETECTED Final    Streptococcus agalactiae NOT DETECTED NOT DETECTED Final   Streptococcus pneumoniae NOT DETECTED NOT DETECTED Final   Streptococcus pyogenes NOT DETECTED NOT DETECTED Final   A.calcoaceticus-baumannii NOT DETECTED NOT DETECTED Final   Bacteroides fragilis NOT DETECTED NOT DETECTED Final   Enterobacterales NOT DETECTED NOT DETECTED Final  Enterobacter cloacae complex NOT DETECTED NOT DETECTED Final   Escherichia coli NOT DETECTED NOT DETECTED Final   Klebsiella aerogenes NOT DETECTED NOT DETECTED Final   Klebsiella oxytoca NOT DETECTED NOT DETECTED Final   Klebsiella pneumoniae NOT DETECTED NOT DETECTED Final   Proteus species NOT DETECTED NOT DETECTED Final   Salmonella species NOT DETECTED NOT DETECTED Final   Serratia marcescens NOT DETECTED NOT DETECTED Final   Haemophilus influenzae NOT DETECTED NOT DETECTED Final   Neisseria meningitidis NOT DETECTED NOT DETECTED Final   Pseudomonas aeruginosa NOT DETECTED NOT DETECTED Final   Stenotrophomonas maltophilia NOT DETECTED NOT DETECTED Final   Candida albicans NOT DETECTED NOT DETECTED Final   Candida auris NOT DETECTED NOT DETECTED Final   Candida glabrata NOT DETECTED NOT DETECTED Final   Candida krusei NOT DETECTED NOT DETECTED Final   Candida parapsilosis NOT DETECTED NOT DETECTED Final   Candida tropicalis NOT DETECTED NOT DETECTED Final   Cryptococcus neoformans/gattii NOT DETECTED NOT DETECTED Final   Meth resistant mecA/C and MREJ DETECTED (A) NOT DETECTED Final    Comment: CRITICAL RESULT CALLED TO, READ BACK BY AND VERIFIED WITH: NATHAN BLUE_0  07/13/21 RH Performed at [facility], [address] [place] [place], [place] [address]   Culture, blood (Routine X 2) w Reflex to ID Panel     Status: None   Collection Time: 07/14/21  4:24 AM   Specimen: BLOOD  Result Value Ref Range Status   Specimen Description BLOOD RIGHT [facility]  Final   Special Requests   Final    BOTTLES DRAWN AEROBIC AND ANAEROBIC Blood Culture adequate  volume   Culture   Final    NO GROWTH 5 DAYS Performed at [facility], [address] [place], [place] [address]    Report Status 07/19/2021 FINAL  Final  Culture, blood (Routine X 2) w Reflex to ID Panel     Status: Abnormal   Collection Time: 07/14/21  4:24 AM   Specimen: BLOOD  Result Value Ref Range Status   Specimen Description   Final    BLOOD LEFT HAND Performed at [facility], [address], [place], [place] [address]    Special Requests   Final    BOTTLES DRAWN AEROBIC AND ANAEROBIC Blood Culture adequate volume Performed at [facility], [address] [place], [place] [address]    Culture  Setup Time   Final    Organism ID to follow ANAEROBIC BOTTLE ONLY GRAM POSITIVE COCCI CRITICAL RESULT CALLED TO, READ BACK BY AND VERIFIED WITH: NATHAN BELUTH @ 0516 ON 07/15/2021.Marland KitchenMarland KitchenTKR Performed at [facility], [address] [place], [place] [address]    Culture (A)  Final    STAPHYLOCOCCUS AUREUS SUSCEPTIBILITIES PERFORMED ON PREVIOUS CULTURE WITHIN THE LAST 5 DAYS. Performed at [facility], [address] [place], [place] [address]    Report Status 07/17/2021 FINAL  Final  Blood Culture ID Panel (Reflexed)     Status: Abnormal   Collection Time: 07/14/21  4:24 AM  Result Value Ref Range Status   Enterococcus faecalis NOT DETECTED NOT DETECTED Final   Enterococcus Faecium NOT DETECTED NOT DETECTED Final   Listeria monocytogenes NOT DETECTED NOT DETECTED Final   Staphylococcus species DETECTED (A) NOT DETECTED Final    Comment: CRITICAL RESULT CALLED TO, READ BACK BY AND VERIFIED WITH: NATHAN BELUTH @ 9326 ON 07/15/2021.Marland KitchenMarland KitchenTKR    Staphylococcus aureus (BCID) DETECTED (A) NOT DETECTED Final    Comment: Methicillin (oxacillin)-resistant Staphylococcus aureus (MRSA).  MRSA is predictably resistant to beta-lactam antibiotics (except ceftaroline). Preferred therapy is vancomycin unless clinically contraindicated. Patient requires  contact precautions if  hospitalized. CRITICAL RESULT CALLED TO, READ BACK BY AND VERIFIED WITH: NATHAN BELUTH @ 4536 ON 07/15/2021.Marland KitchenMarland KitchenTKR    Staphylococcus epidermidis NOT DETECTED NOT DETECTED Final   Staphylococcus lugdunensis NOT DETECTED NOT DETECTED Final   Streptococcus species NOT DETECTED NOT DETECTED Final   Streptococcus agalactiae NOT DETECTED NOT DETECTED Final   Streptococcus pneumoniae NOT DETECTED NOT DETECTED Final   Streptococcus pyogenes NOT DETECTED NOT DETECTED Final   A.calcoaceticus-baumannii NOT DETECTED NOT DETECTED Final   Bacteroides fragilis NOT DETECTED NOT DETECTED Final   Enterobacterales NOT DETECTED NOT DETECTED Final   Enterobacter cloacae complex NOT DETECTED NOT DETECTED Final   Escherichia coli NOT DETECTED NOT DETECTED Final   Klebsiella aerogenes NOT DETECTED NOT DETECTED Final   Klebsiella oxytoca NOT DETECTED NOT DETECTED Final   Klebsiella pneumoniae NOT DETECTED NOT DETECTED Final   Proteus species NOT DETECTED NOT DETECTED Final   Salmonella species NOT DETECTED NOT DETECTED Final   Serratia marcescens NOT DETECTED NOT DETECTED Final   Haemophilus influenzae NOT DETECTED NOT DETECTED Final   Neisseria meningitidis NOT DETECTED NOT DETECTED Final   Pseudomonas aeruginosa NOT DETECTED NOT DETECTED Final   Stenotrophomonas maltophilia NOT DETECTED NOT DETECTED Final   Candida albicans NOT DETECTED NOT DETECTED Final   Candida auris NOT DETECTED NOT DETECTED Final   Candida glabrata NOT DETECTED NOT DETECTED Final   Candida krusei NOT DETECTED NOT DETECTED Final   Candida parapsilosis NOT DETECTED NOT DETECTED Final   Candida tropicalis NOT DETECTED NOT DETECTED Final   Cryptococcus neoformans/gattii NOT DETECTED NOT DETECTED Final   Meth resistant mecA/C and MREJ DETECTED (A) NOT DETECTED Final    Comment: CRITICAL RESULT CALLED TO, READ BACK BY AND VERIFIED WITH: NATHAN BELUTH @ 0516 ON 07/15/2021.Marland KitchenMarland KitchenTKR Performed at [facility],  [address], [place], [place] [address]   Surgical pcr screen     Status: None   Collection Time: 07/14/21 10:58 AM   Specimen: Nasal Mucosa; Nasal Swab  Result Value Ref Range Status   MRSA, PCR NEGATIVE NEGATIVE Final   Staphylococcus aureus NEGATIVE NEGATIVE Final    Comment: (NOTE) The Xpert SA Assay (FDA approved for NASAL specimens in patients 55 years of age and older), is one component of a comprehensive surveillance program. It is not intended to diagnose infection nor to guide or monitor treatment. Performed at [facility], [place], [place] [address]   Aerobic/Anaerobic Culture w Gram Stain (surgical/deep wound)     Status: None   Collection Time: 07/14/21  5:24 PM   Specimen: PATH Other; Tissue  Result Value Ref Range Status   Specimen Description   Final    BONE LEFT FIRST METATARSAL Performed at [facility], [place] [address], [place], [place] [address]    Special Requests   Final    BONE CULTURE Performed at [facility], [place], [place] [address]    Gram Stain NO WBC SEEN RARE GRAM POSITIVE COCCI IN PAIRS   Final   Culture   Final    MODERATE METHICILLIN RESISTANT STAPHYLOCOCCUS AUREUS NO ANAEROBES ISOLATED Performed at [facility], [place] [address], [place], [place] [address]    Report Status 07/19/2021 FINAL  Final   Organism ID, Bacteria METHICILLIN RESISTANT STAPHYLOCOCCUS AUREUS  Final      Susceptibility   Methicillin  resistant staphylococcus aureus - MIC*    CIPROFLOXACIN <=0.5 SENSITIVE Sensitive     ERYTHROMYCIN >=8 RESISTANT Resistant     GENTAMICIN <=0.5 SENSITIVE Sensitive     OXACILLIN >=4 RESISTANT Resistant     TETRACYCLINE <=1 SENSITIVE Sensitive     VANCOMYCIN 1 SENSITIVE Sensitive     TRIMETH/SULFA <=10 SENSITIVE Sensitive     CLINDAMYCIN <=0.25 SENSITIVE Sensitive     RIFAMPIN <=0.5 SENSITIVE Sensitive     Inducible Clindamycin NEGATIVE Sensitive     * MODERATE  METHICILLIN RESISTANT STAPHYLOCOCCUS AUREUS  Aerobic/Anaerobic Culture w Gram Stain (surgical/deep wound)     Status: None   Collection Time: 07/14/21  5:24 PM   Specimen: PATH Other; Tissue  Result Value Ref Range Status   Specimen Description   Final    WOUND LEFT FIRST METATARSAL CULTURE Performed at [facility], [address], [place], [place] [address]    Special Requests SWABS  Final   Gram Stain NO WBC SEEN RARE GRAM POSITIVE COCCI IN PAIRS   Final   Culture   Final    MODERATE METHICILLIN RESISTANT STAPHYLOCOCCUS AUREUS NO ANAEROBES ISOLATED Performed at [facility], [place] [address], [place], [place] [address]    Report Status 07/19/2021 FINAL  Final   Organism ID, Bacteria METHICILLIN RESISTANT STAPHYLOCOCCUS AUREUS  Final      Susceptibility   Methicillin resistant staphylococcus aureus - MIC*    CIPROFLOXACIN <=0.5 SENSITIVE Sensitive     ERYTHROMYCIN >=8 RESISTANT Resistant     GENTAMICIN <=0.5 SENSITIVE Sensitive     OXACILLIN >=4 RESISTANT Resistant     TETRACYCLINE <=1 SENSITIVE Sensitive     VANCOMYCIN 1 SENSITIVE Sensitive     TRIMETH/SULFA <=10 SENSITIVE Sensitive     CLINDAMYCIN <=0.25 SENSITIVE Sensitive     RIFAMPIN <=0.5 SENSITIVE Sensitive     Inducible Clindamycin NEGATIVE Sensitive     * MODERATE METHICILLIN RESISTANT STAPHYLOCOCCUS AUREUS  Aerobic/Anaerobic Culture w Gram Stain (surgical/deep wound)     Status: None (Preliminary result)   Collection Time: 07/16/21  1:20 PM   Specimen: Abdomen; Abscess  Result Value Ref Range Status   Specimen Description   Final    ABDOMEN Performed at [facility], [address] [place], [place] [address]    Special Requests   Final    NONE Performed at [facility], [place], [place] [address]    Gram Stain   Final    ABUNDANT WBC PRESENT, PREDOMINANTLY PMN ABUNDANT GRAM POSITIVE COCCI Performed at [facility], [place] [address] [place], [place]  [address]    Culture   Final    FEW STAPHYLOCOCCUS AUREUS SUSCEPTIBILITIES PERFORMED ON PREVIOUS CULTURE WITHIN THE LAST 5 DAYS. NO ANAEROBES ISOLATED; CULTURE IN PROGRESS FOR 5 DAYS    Report Status PENDING  Incomplete  Aerobic/Anaerobic Culture w Gram Stain (surgical/deep wound)     Status: None (Preliminary result)   Collection Time: 07/16/21  3:43 PM   Specimen: Other Source; Body Fluid  Result Value Ref Range Status   Specimen Description   Final    WOUND Performed at [facility], [address] [place], [place] [address]    Special Requests   Final    ABSCESS Performed at [facility], [address] [place],  [address]    Gram Stain   Final    NO SQUAMOUS EPITHELIAL CELLS SEEN FEW WBC SEEN FEW GRAM POSITIVE COCCI Performed at [facility], [place] [address], [place], [place] [address]    Culture   Final    MODERATE METHICILLIN RESISTANT STAPHYLOCOCCUS AUREUS NO ANAEROBES ISOLATED; CULTURE IN PROGRESS FOR 5 DAYS    Report Status PENDING  Incomplete   Organism ID, Bacteria METHICILLIN RESISTANT STAPHYLOCOCCUS AUREUS  Final      Susceptibility   Methicillin resistant staphylococcus aureus - MIC*    CIPROFLOXACIN <=0.5 SENSITIVE Sensitive     ERYTHROMYCIN 2 INTERMEDIATE Intermediate     GENTAMICIN <=0.5 SENSITIVE Sensitive     OXACILLIN >=4 RESISTANT Resistant     TETRACYCLINE <=1 SENSITIVE Sensitive     VANCOMYCIN 1 SENSITIVE Sensitive     TRIMETH/SULFA <=10 SENSITIVE Sensitive     CLINDAMYCIN <=0.25 SENSITIVE Sensitive     RIFAMPIN <=0.5 SENSITIVE Sensitive     Inducible Clindamycin NEGATIVE Sensitive     * MODERATE METHICILLIN RESISTANT STAPHYLOCOCCUS AUREUS  Culture, blood (Routine X 2) w Reflex to ID Panel     Status: None (Preliminary result)   Collection Time: 07/18/21  4:48 AM   Specimen: BLOOD  Result Value Ref Range Status   Specimen Description BLOOD RIGHT ANTECUBITAL  Final   Special Requests   Final    BOTTLES DRAWN  AEROBIC AND ANAEROBIC Blood Culture adequate volume   Culture   Final    NO GROWTH 2 DAYS Performed at [facility], [address] [place], [place] [address]    Report Status PENDING  Incomplete  Culture, blood (Routine X 2) w Reflex to ID Panel     Status: None (Preliminary result)   Collection Time: 07/18/21  4:48 AM   Specimen: BLOOD  Result Value Ref Range Status   Specimen Description BLOOD BLOOD RIGHT WRIST  Final   Special Requests   Final    BOTTLES DRAWN AEROBIC AND ANAEROBIC Blood Culture adequate volume   Culture   Final    NO GROWTH 2 DAYS Performed at [facility], [address] [place], [place] [address]    Report Status PENDING  Incomplete    Labs: CBC: Recent Labs  Lab 07/14/21 0424 07/15/21 0429 07/16/21 0617 07/17/21 0529 07/20/21 0409  WBC 26.0* 24.7* 20.0* 12.9* 7.3  HGB 12.4 10.5* 10.9* 9.7* 8.3*  HCT 35.1* 30.4* 31.9* 28.8* 25.2*  MCV 81.3 81.5 81.6 83.7 84.8  PLT 175 189 227 219 785   Basic Metabolic Panel: Recent Labs  Lab 07/14/21 0424 07/15/21 0429 07/17/21 0529 07/19/21 0447 07/20/21 0409  NA  --  135 134*  --  137  K 3.5 4.7 4.7  --  3.6  CL  --  102 102  --  107  CO2  --  26 24  --  22  GLUCOSE  --  105* 103*  --  86  BUN  --  18 16  --  12  CREATININE  --  0.50 0.43* 0.46 0.51  CALCIUM  --  8.0* 8.2*  --  7.8*  MG 2.2  --  2.1  --  2.1   Liver Function Tests: No results for input(s): AST, ALT, ALKPHOS, BILITOT, PROT, ALBUMIN in the last 168 hours. CBG: No results for input(s): GLUCAP in the last 168 hours.  Discharge time spent: greater than 30 minutes.  Signed: Sharen Hones, MD [facility] 07/20/2021

## 2021-07-20 NOTE — Progress Notes (Addendum)
1554 IV team in to place PICC line at this time   1630 Nurse educated pt and husband on how to empty j/p drain and record output. PICC line in place  1650 D/c instructions reviewed with pt and husband. All questions and concerns answered. IV removed. PICC line in place to R arm.

## 2021-07-20 NOTE — Progress Notes (Signed)
Peripherally Inserted Central Catheter Placement  The IV Nurse has discussed with the patient and/or persons authorized to consent for the patient, the purpose of this procedure and the potential benefits and risks involved with this procedure.  The benefits include less needle sticks, lab draws from the catheter, and the patient may be discharged home with the catheter. Risks include, but not limited to, infection, bleeding, blood clot (thrombus formation), and puncture of an artery; nerve damage and irregular heartbeat and possibility to perform a PICC exchange if needed/ordered by physician.  Alternatives to this procedure were also discussed.  Bard Power PICC patient education guide, fact sheet on infection prevention and patient information card has been provided to patient /or left at bedside.    PICC Placement Documentation  PICC Single Lumen 07/20/21 Right Basilic 37 cm 0 cm (Active)  Indication for Insertion or Continuance of Line Home intravenous therapies (PICC only) 07/20/21 1600  Exposed Catheter (cm) 0 cm 07/20/21 1600  Site Assessment Clean, Dry, Intact 07/20/21 1600  Line Status Flushed;Blood return noted 07/20/21 1600  Dressing Type Transparent;Securing device 07/20/21 1600  Dressing Status Clean, Dry, Intact;Antimicrobial disc in place 07/20/21 1600  Dressing Change Due 07/27/21 07/20/21 1600       Stacie Glaze Horton 07/20/2021, 4:02 PM

## 2021-07-20 NOTE — Progress Notes (Signed)
Patient had a percutaneous pigtail drainage catheter placed by IR during this admission and planning for discharge soon. The patient should discharge with a prescription for saline flushes and flush drainage catheter with 5 cc of sterile saline daily and record output daily.   Order to our IR clinic was placed and the patient will hear from our clinic staff to schedule drain follow up appointment time.  [facility] [address]  [place] [address] [phone]  Berneta Levins, PA-C 07/20/2021, 11:40 AM

## 2021-07-20 NOTE — Progress Notes (Signed)
Date of Admission:  07/12/2021     ID: Christy Ray is a 66 y.o. 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, left (HCC)  Husband at bed side  Subjective: Patient doing much better No fever Psoas drain still present   Medications:   amLODipine  2.5 mg Oral Daily   enoxaparin (LOVENOX) injection  40 mg Subcutaneous G88P   folic acid  1 mg Oral Daily   hydrOXYzine  10 mg Oral QHS   losartan  100 mg Oral Daily   rosuvastatin  10 mg Oral Daily   senna-docusate  2 tablet Oral BID   sodium chloride flush  3 mL Intravenous Q12H   sodium chloride flush  5 mL Intracatheter Q8H    Objective: Vital signs in last 24 hours: Temp:  [98 F (36.7 C)-98.7 F (37.1 C)] 98 F (36.7 C) (05/23 0815) Pulse Rate:  [73-88] 80 (05/23 0815) Resp:  [15-17] 15 (05/23 0815) BP: (118-134)/(72-83) 134/83 (05/23 0815) SpO2:  [97 %-100 %] 100 % (05/23 0815)  LDA Left psoas abscess drainage catheter  PHYSICAL EXAM:  General: Awake and alert no distress.  Lungs: b/l air entry. Heart: Regular rate and rhythm, no murmur, rub or gallop. Abdomen: Soft, non-tender,not distended. Bowel sounds normal. No masses Extremities: Left foot surgical dressing not removed Left was muscle drain on the back Skin: No rashes or lesions. Or bruising Lymph: Cervical, supraclavicular normal. Neurologic: Grossly non-focal  Lab Results Recent Labs    07/19/21 0447 07/20/21 0409  WBC  --  7.3  HGB  --  8.3*  HCT  --  25.2*  NA  --  137  K  --  3.6  CL  --  107  CO2  --  22  BUN  --  12  CREATININE 0.46 0.51    Microbiology: 07/12/2021 blood culture MRSA 07/14/2021 blood culture MRSA 07/14/2021 abscess culture Staph aureus 07/18/21 BC- NG  Assessment/Plan: Disseminated MRSA infection. MRSA bacteremia MRSA  septic arthritis of the left great toe underwent irrigation and debridement and first metatarsal phalangeal joint resection.  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  Culture repeated on 07/18/2021 and it is pending.  Patient is currently on daptomycin 10mg /kg  Anemia.  Patient is immunocompromised due to CellCept and prednisone she takes for dermatomyositis.  On hold  Hypertension on amlodipine and losartan  Hyperlipidemia on rosuvastatin 10 mg Watch closely CPK as risk for rhabdomyolysis as she is on daptomycin.  Discussed the management with patient and her husband Signed                                      Diagnosis: MRSA bacteremia with multiple site infections Baseline Creatinine <1      No Known Allergies   OPAT Orders Discharge antibiotics: Daptomycin 600mg  IV every 24 hours Duration: 6 weeks End Date: 08/29/21   [facility] Per Protocol:   Labs weekly while on IV antibiotics: _X_ CBC with differential   _X_ CMP _X_ CRP _X_ ESR _X_ CK   _X_ Please  pull PIC at completion of IV antibiotics     Fax weekly labs to  Dr.Lorn Butcher [phone] And  [facility] pharmacist at [phone]   Clinic Follow Up Appt: with Dr.Damita Eppard 08/10/21 at 9.15 am     Call [phone] with any questions or critical values

## 2021-07-20 NOTE — Care Management Important Message (Signed)
Important Message  Patient Details  Name: LAINE FONNER MRN: [id number] Date of Birth: September 22, 1955   Medicare Important Message Given:  Yes  Patient is in an isolation room so I reviewed her Important Message from Medicare with her by phone [phone]). She is in agreement with her discharge.  I asked if she would like a copy and she declined. I wished her well and thanked her for her time.   Olegario Messier A Kazim Corrales 07/20/2021, 10:47 AM

## 2021-07-20 NOTE — Treatment Plan (Signed)
Diagnosis: MRSA bacteremia with multiple site infections Baseline Creatinine <1    No Known Allergies  OPAT Orders Discharge antibiotics: Daptomycin 621m IV every 24 hours Duration: 6 weeks End Date: 08/29/21  [facility]  Labs weekly while on IV antibiotics: _X_ CBC with differential  _X_ CMP _X_ CRP _X_ ESR _X_ CK  _X_ Please pull PIC at completion of IV antibiotics   Fax weekly labs to  Dr.Rosendo Couser ([phone]  [facility] pharmacist at [phone] Clinic Follow Up Appt: with Dr.Britteny Fiebelkorn 08/10/21 at 9.15 am   Call [phone] any questions or critical values

## 2021-07-20 NOTE — Progress Notes (Signed)
PHARMACY CONSULT NOTE FOR:  OUTPATIENT  PARENTERAL ANTIBIOTIC THERAPY (OPAT)  Indication: MRSA bacteremia with multiple abscesses Regimen: Daptomycin 600mg  IV q24h End date: 08/29/2021  IV antibiotic discharge orders are pended. To discharging provider:  please sign these orders via discharge navigator,  Select New Orders & click on the button choice - Manage This Unsigned Work.     Thank you for allowing pharmacy to be a part of this patient's care.  10/30/2021, PharmD, BCPS, BCIDP Work Cell: [phone] 07/20/2021 11:29 AM

## 2021-07-21 LAB — AEROBIC/ANAEROBIC CULTURE W GRAM STAIN (SURGICAL/DEEP WOUND): Gram Stain: NONE SEEN

## 2021-07-22 ENCOUNTER — Other Ambulatory Visit: Payer: Self-pay | Admitting: Infectious Diseases

## 2021-07-22 DIAGNOSIS — K6812 Psoas muscle abscess: Secondary | ICD-10-CM

## 2021-07-23 LAB — CULTURE, BLOOD (ROUTINE X 2)
Culture: NO GROWTH
Culture: NO GROWTH
Special Requests: ADEQUATE
Special Requests: ADEQUATE

## 2021-07-29 ENCOUNTER — Ambulatory Visit
Admission: RE | Admit: 2021-07-29 | Discharge: 2021-07-29 | Disposition: A | Discharge status: Home / Self Care | Payer: Medicare Other | Source: Ambulatory Visit | Attending: Infectious Diseases | Admitting: Infectious Diseases

## 2021-07-29 ENCOUNTER — Ambulatory Visit
Admission: RE | Admit: 2021-07-29 | Discharge: 2021-07-29 | Disposition: A | Discharge status: Home / Self Care | Payer: Medicare Other | Source: Ambulatory Visit | Attending: Radiology | Admitting: Radiology

## 2021-07-29 DIAGNOSIS — K6812 Psoas muscle abscess: Secondary | ICD-10-CM

## 2021-07-29 HISTORY — PX: IR RADIOLOGIST EVAL & MGMT: IMG5224

## 2021-07-29 IMAGING — CT CT ABD-PELV W/ CM
1 of 2 series · 14 of 32 positions shown, 18 images · IV contrast (agent unspecified)
Comparison: MRI thoracic and lumbar spine [DATE]; CT-guided
drain placement images [DATE].

CLINICAL DATA: 66-year-old female with a history of left psoas and
left posterior paraspinal muscle abscesses. She had a percutaneous
drain placed on [DATE] and presents today clinically improved
for repeat imaging.

EXAM:
CT ABDOMEN AND PELVIS WITH CONTRAST
TECHNIQUE: Multidetector CT imaging of the abdomen and pelvis was performed
using the standard protocol following bolus administration of
intravenous contrast.

[Series 2: a/p w/ 5mm · axial · 0.79mm/px · z∈[-361,+34]mm · 14 of 87 slices shown, 18 images]
[im 4/87  soft-tissue]
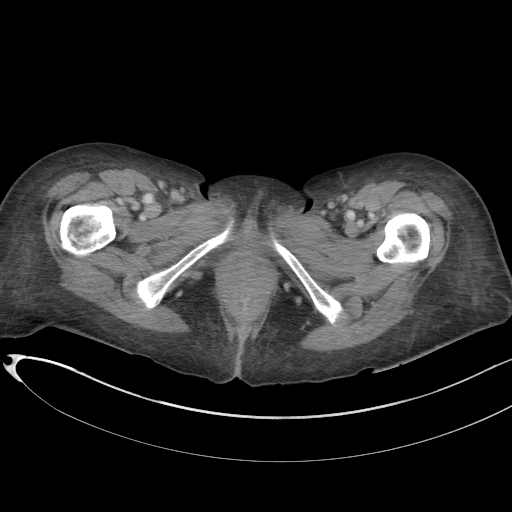
[im 4/87  bone]
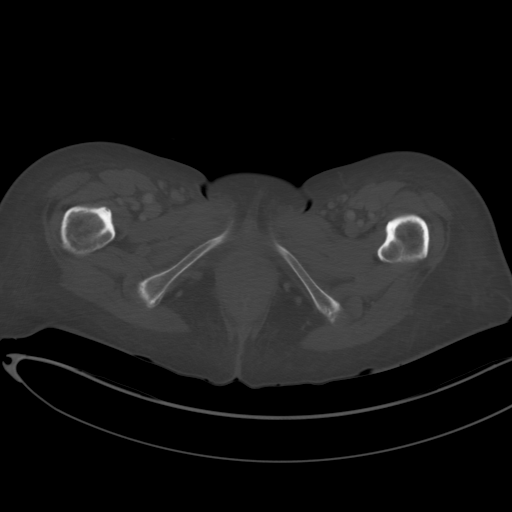
[im 11/87  soft-tissue]
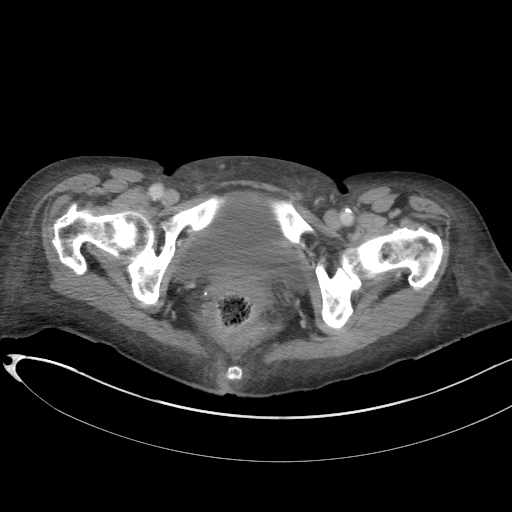
[im 18/87  soft-tissue]
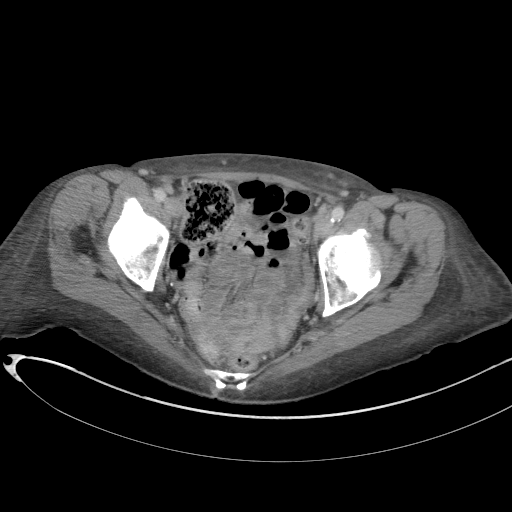
[im 26/87  soft-tissue]
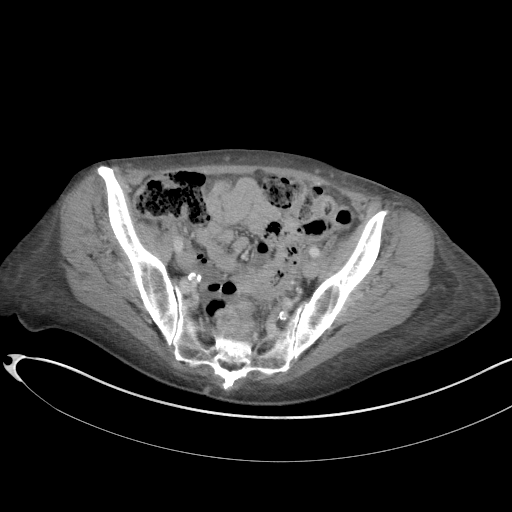
[im 33/87  soft-tissue]
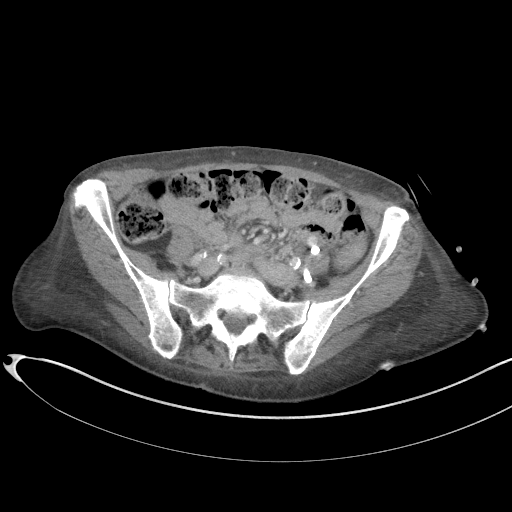
[im 40/87  soft-tissue]
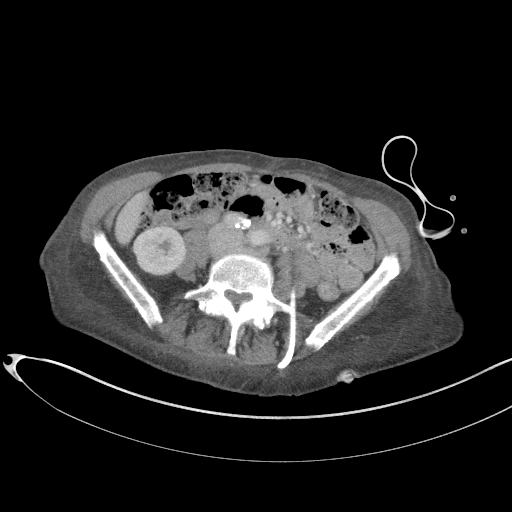
[im 47/87  soft-tissue]
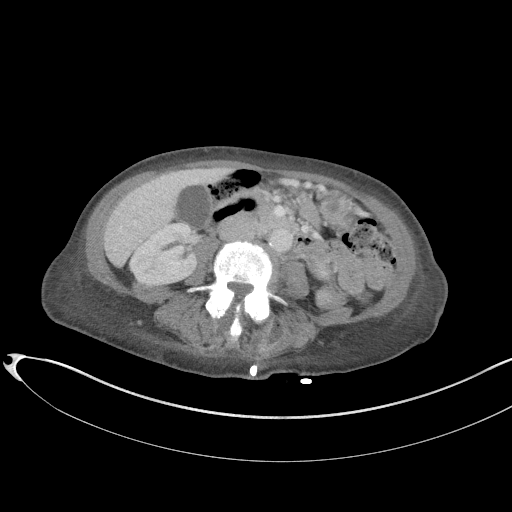
[im 54/87  soft-tissue]
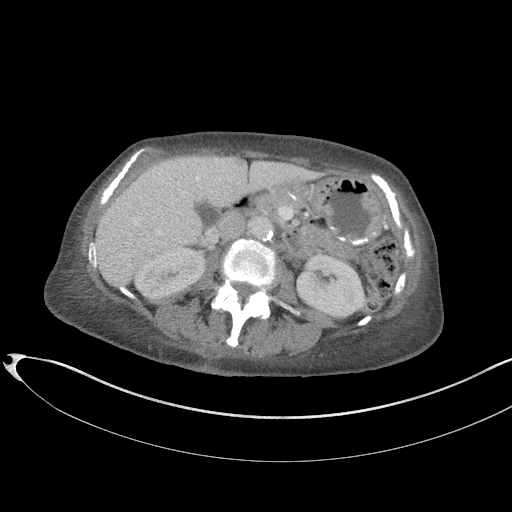
[im 61/87  soft-tissue]
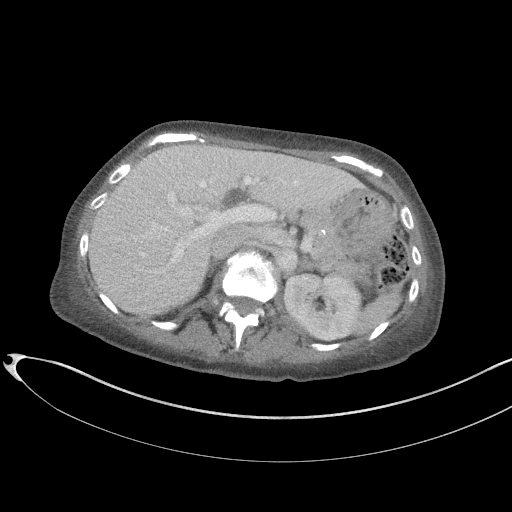
[im 61/87  bone]
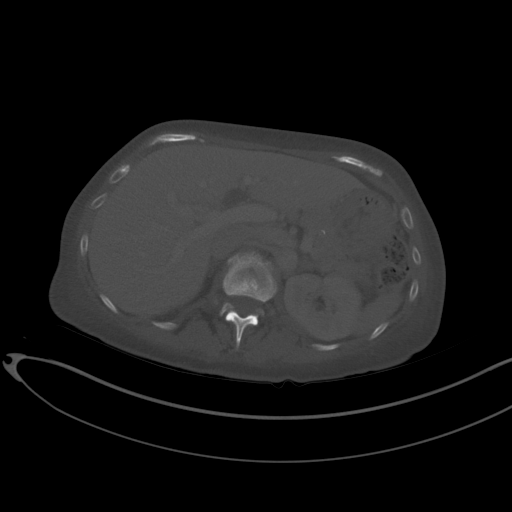
[im 69/87  soft-tissue]
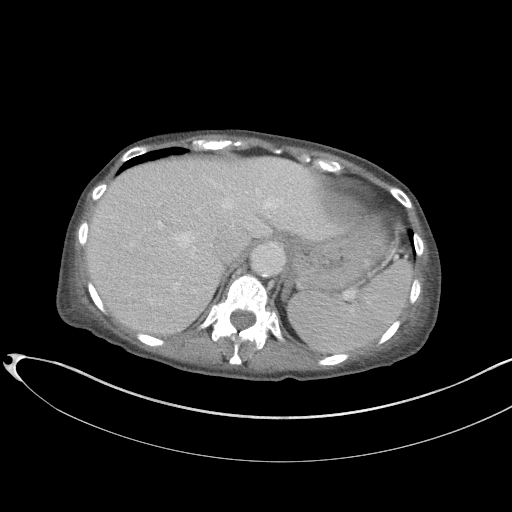
[im 72/87  lung]
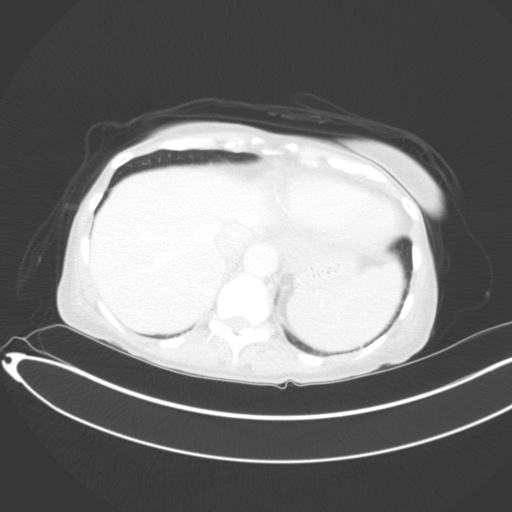
[im 76/87  soft-tissue]
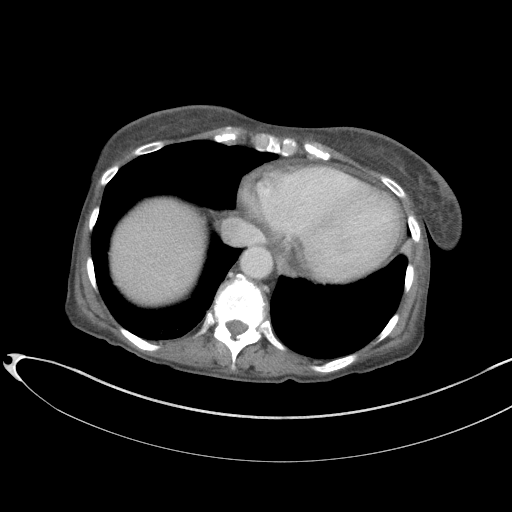
[im 76/87  lung]
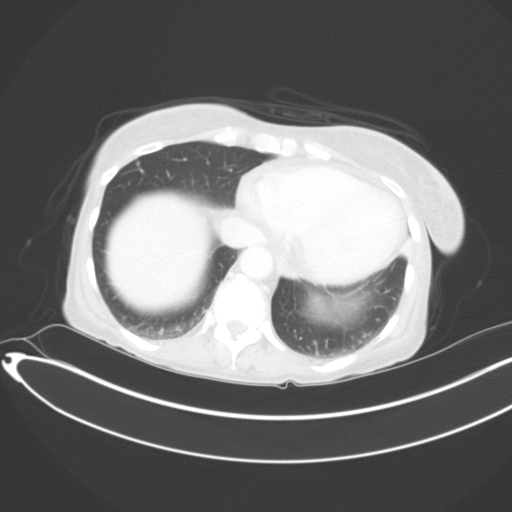
[im 79/87  lung]
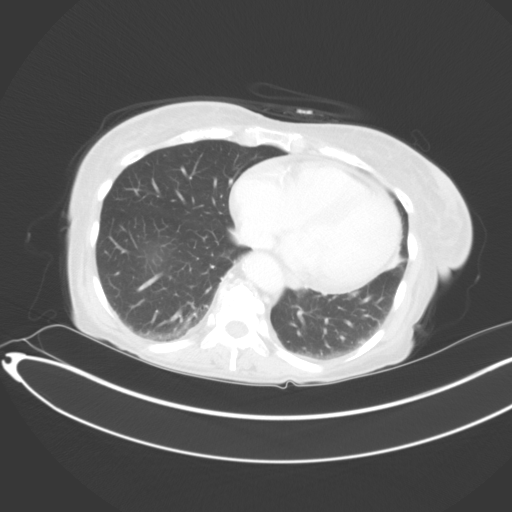
[im 83/87  soft-tissue]
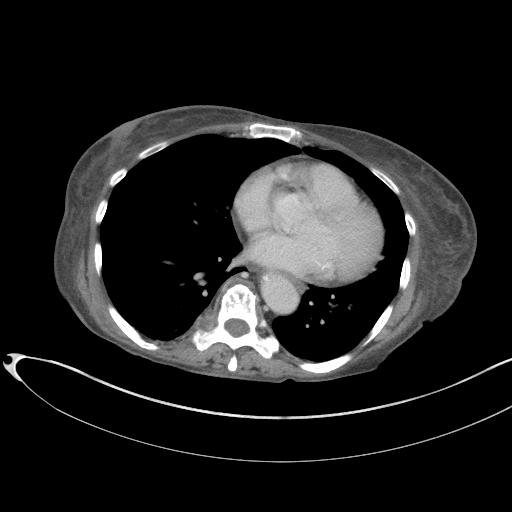
[im 83/87  lung]
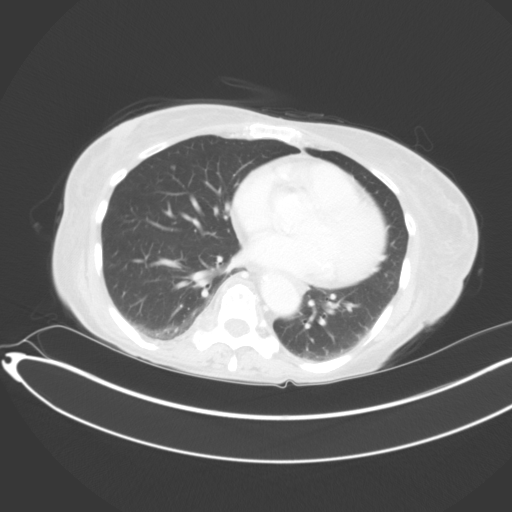

[14 of 32 positions shown; findings below may reference images not displayed]

RADIATION DOSE REDUCTION: This exam was performed according to the
departmental dose-optimization program which includes automated
exposure control, adjustment of the mA and/or kV according to
patient size and/or use of iterative reconstruction technique.

CONTRAST:  100mL [95] IOPAMIDOL ([95]) INJECTION 61%
FINDINGS: Lower chest: The lung bases are clear. Visualized cardiac structures
are within normal limits for size. No pericardial effusion.
Unremarkable visualized distal thoracic esophagus.

Hepatobiliary: No focal liver abnormality is seen. No gallstones,
gallbladder wall thickening, or biliary dilatation.

Pancreas: Unremarkable. No pancreatic ductal dilatation or
surrounding inflammatory changes.

Spleen: Normal in size without focal abnormality.

Adrenals/Urinary Tract: Adrenal glands are unremarkable. Kidneys are
normal, without renal calculi, focal lesion, or hydronephrosis.
Bladder is unremarkable.

Stomach/Bowel: No focal bowel wall thickening or evidence of
obstruction.

Vascular/Lymphatic: Tortuous abdominal aorta with scattered
atherosclerotic calcifications. No aneurysm. No suspicious
lymphadenopathy.

Reproductive: Status post hysterectomy. No adnexal masses.

Other: No abdominal wall hernia or abnormality. No abdominopelvic
ascites.

Musculoskeletal: Decreasing size of left paraspinal muscle
peripherally enhancing fluid collection now measuring 2.0 x 0.9 cm
compared to 2.8 x 1.6 cm previously. Left psoas drain in good
position. Total interval resolution of abscess cavity from the left
iliopsoas musculature. Multifocal degenerative disc disease and
bilateral lower lumbar facet arthropathy without focality.
IMPRESSION: 1. Resolved left psoas abscess with percutaneous drain in position.
2. Significant interval decrease in left paraspinal muscle abscess
now measuring 2.0 x 0.9 cm compared to 2.8 x 1.6 cm previously.

## 2021-07-29 MED ORDER — IOPAMIDOL (ISOVUE-300) INJECTION 61%
100.0000 mL | Freq: Once | INTRAVENOUS | Status: AC | PRN
  Administered 2021-07-29: 100 mL via INTRAVENOUS

## 2021-07-29 NOTE — Progress Notes (Signed)
Chief Complaint: Patient was seen in consultation today for left psoas abscess with drain in place at the request of [place] M  Referring Physician(s): Smith,Kristi M  History of Present Illness: Christy Ray is a 66 y.o. female presents for follow-up of her left psoas abscess drain.  Her drain output has been minimal, only 1 or 2 mL/day.  Clinically, she feels much improved with less back and thigh pain.  She denies fever, chills, nausea, vomiting or other clinical issues.  She is still taking IV antibiotics via her PICC.  Past Medical History:  Diagnosis Date   Dermatomyositis ([place])    Polyarthritis     Past Surgical History:  Procedure Laterality Date   INCISION AND DRAINAGE OF WOUND Left 07/14/2021   Procedure: IRRIGATION AND DEBRIDEMENT WOUND;  Surgeon: Caroline More, DPM;  Location: [facility] ORS;  Service: Podiatry;  Laterality: Left;   LUMBAR LAMINECTOMY FOR EPIDURAL ABSCESS N/A 07/16/2021   Procedure: LUMBAR LAMINECTOMY FOR EPIDURAL ABSCESS;  Surgeon: Meade Maw, MD;  Location: [facility] ORS;  Service: Neurosurgery;  Laterality: N/A;   OSTECTOMY Left 07/14/2021   Procedure: JOINT RESECTION;  Surgeon: Caroline More, DPM;  Location: [facility] OR[facility];  Service: Podiatry;  Laterality: Left;    Allergies: Patient has no known allergies.  Medications: Prior to Admission medications   Medication Sig Start Date End Date Taking? Authorizing Provider  amLODipine (NORVASC) 2.5 MG tablet Take 2.5 mg by mouth daily. 06/07/21   [provider]  daptomycin (CUBICIN) IVPB Inject 600 mg into the vein daily. Indication:  MRSA bacteremia and multiple abscess First Dose: Yes Last Day of Therapy:  08/29/2021 Labs - Once weekly:  CBC/D, CMP, ESR, CRP and CPK Please pull PIC at completion of IV antibiotics Fax weekly labs to  Dr.Ravishankar [phone] And  [facility] pharmacist at [phone] Method of administration: IV Push Method of administration may be changed at the discretion  of home infusion pharmacist based upon assessment of the patient and/or caregiver's ability to self-administer the medication ordered. 07/21/21 08/29/21  Sharen Hones, MD  folic acid (FOLVITE) 1 MG tablet Take 1 mg by mouth daily. 05/28/21   [provider]  hydrOXYzine (ATARAX) 10 MG tablet Take 10 mg by mouth at bedtime. 05/17/21   [provider]  losartan (COZAAR) 100 MG tablet Take 1 tablet (100 mg total) by mouth daily. 07/21/21   Sharen Hones, MD  predniSONE (DELTASONE) 10 MG tablet Take 1 tablet (10 mg total) by mouth daily. 07/20/21   Sharen Hones, MD  rosuvastatin (CRESTOR) 10 MG tablet Take 10 mg by mouth daily. 05/11/21   [provider]  senna-docusate (SENOKOT-S) 8.6-50 MG tablet Take 2 tablets by mouth 2 (two) times daily as needed for mild constipation. 07/20/21   Sharen Hones, MD     No family history on file.  Social History   Socioeconomic History   Marital status: Married    Spouse name: Not on file   Number of children: Not on file   Years of education: Not on file   Highest education level: Not on file  Occupational History   Not on file  Tobacco Use   Smoking status: Every Day    Packs/day: 0.50    Types: Cigarettes   Smokeless tobacco: Never  Substance and Sexual Activity   Alcohol use: No   Drug use: Never   Sexual activity: Not on file  Other Topics Concern   Not on file  Social History Narrative   Not  on file   Social Determinants of Health   Financial Resource Strain: Not on file  Food Insecurity: Not on file  Transportation Needs: Not on file  Physical Activity: Not on file  Stress: Not on file  Social Connections: Not on file    Review of Systems: A 12 point ROS discussed and pertinent positives are indicated in the HPI above.  All other systems are negative.  Review of Systems  Vital Signs: There were no vitals taken for this visit.  Physical Exam Constitutional:      General: She is not in acute distress.     Appearance: Normal appearance.  HENT:     Head: Normocephalic and atraumatic.  Eyes:     General: No scleral icterus. Cardiovascular:     Rate and Rhythm: Normal rate.  Pulmonary:     Effort: Pulmonary effort is normal.  Abdominal:     General: Abdomen is flat.     Palpations: Abdomen is soft.  Skin:    General: Skin is warm and dry.  Neurological:     Mental Status: She is alert and oriented to person, place, and time.  Psychiatric:        Mood and Affect: Mood normal.        Behavior: Behavior normal.     Imaging: DG Lumbar Spine 2-3 Views  Result Date: 07/16/2021 CLINICAL DATA:  Post lumbar laminectomy reportedly for abscess. EXAM: LUMBAR SPINE - 2-3 VIEW; DG C-ARM 1-60 MIN-NO REPORT COMPARISON:  CT-guided drain placement into psoas abscess, images from Jul 16, 2021. FINDINGS: Limited intraoperative fluoroscopic views are provided. Total of 4 fluoroscopic images. Initial image shows a radiopaque indicator directed at the L4 level and a pigtail drain projecting over L5. Subsequent image shows indicator directed into the soft tissues tip overlying posterior elements also at the L4 level. Placement of a port at the level of the L4 pedicle on subsequent image shown on the RIGHT, pigtail drain in LEFT psoas. Surgical instrument directed through the port. Hemostats project over the lower pelvis on the final image. IMPRESSION: 1. Intraoperative fluoroscopic localization of the L4 level. Signs of pigtail drain in the contralateral psoas muscle 2. Fluoroscopic time: 3 seconds 3. Fluoroscopic dose: 1.225 mGy. Electronically Signed   By: Zetta Bills M.D.   On: 07/16/2021 15:42   MR THORACIC SPINE W WO CONTRAST  Result Date: 07/14/2021 CLINICAL DATA:  Back pain and MRSA bacteremia EXAM: MRI THORACIC AND LUMBAR SPINE WITHOUT AND WITH CONTRAST TECHNIQUE: Multiplanar and multiecho pulse sequences of the thoracic and lumbar spine were obtained without and with intravenous contrast. CONTRAST:  23m  GADAVIST GADOBUTROL 1 MMOL/ML IV SOLN COMPARISON:  None Available. FINDINGS: MRI THORACIC SPINE FINDINGS Alignment:  Normal Vertebrae: No fracture, evidence of discitis, or bone lesion. Cord: Spinal cord is normal. However, there is a right is eccentric and dorsal epidural collection extending from T7-T12 with peripheral contrast enhancement. This causes mild attenuation of the thecal sac without spinal cord compression. There is a smaller component of the collection at the left ventral aspect of the T11 level. Paraspinal and other soft tissues: Negative Disc levels: No discogenic spinal canal stenosis. MRI LUMBAR SPINE FINDINGS Segmentation:  Standard. Alignment:  Grade 1 anterolisthesis at L3-4 Vertebrae: Mild edema at the inferior endplate of L1 is likely degenerative. Conus medullaris: The conus medullaris terminates at the L1-2 disc level. There is mild abnormal contrast enhancement of the cauda equina. Within the dorsal epidural space at L3-5, there is a fluid  collection measuring up to 7 mm in thickness. This severely crowds the cauda equina nerve roots in the spinal canal. Paraspinal and other soft tissues: Left psoas abscess measures 1.6 x 8.0 cm. There are small paraspinal abscesses on the left at the L4 level. Disc levels: T12-L1: Small disc bulge without spinal canal stenosis. L1-2: Small disc bulge with endplate spurring.  No stenosis. L2-3: Normal. L3-4: Moderate facet hypertrophy and small disc bulge with moderate spinal canal stenosis. L4-5: Severe facet arthrosis and small disc bulge with moderate spinal canal stenosis. No neural foraminal stenosis. L5-S1: Severe facet arthrosis without spinal canal or neural foraminal stenosis. IMPRESSION: 1. 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 There is moderate attenuation of the thecal sac. 2. Left psoas abscess and small left posterior paraspinal muscle abscess. Electronically Signed   By: Ulyses Jarred M.D.   On: 07/14/2021 22:54   MR Lumbar  Spine W Wo Contrast  Result Date: 07/14/2021 CLINICAL DATA:  Back pain and MRSA bacteremia EXAM: MRI THORACIC AND LUMBAR SPINE WITHOUT AND WITH CONTRAST TECHNIQUE: Multiplanar and multiecho pulse sequences of the thoracic and lumbar spine were obtained without and with intravenous contrast. CONTRAST:  57m GADAVIST GADOBUTROL 1 MMOL/ML IV SOLN COMPARISON:  None Available. FINDINGS: MRI THORACIC SPINE FINDINGS Alignment:  Normal Vertebrae: No fracture, evidence of discitis, or bone lesion. Cord: Spinal cord is normal. However, there is a right is eccentric and dorsal epidural collection extending from T7-T12 with peripheral contrast enhancement. This causes mild attenuation of the thecal sac without spinal cord compression. There is a smaller component of the collection at the left ventral aspect of the T11 level. Paraspinal and other soft tissues: Negative Disc levels: No discogenic spinal canal stenosis. MRI LUMBAR SPINE FINDINGS Segmentation:  Standard. Alignment:  Grade 1 anterolisthesis at L3-4 Vertebrae: Mild edema at the inferior endplate of L1 is likely degenerative. Conus medullaris: The conus medullaris terminates at the L1-2 disc level. There is mild abnormal contrast enhancement of the cauda equina. Within the dorsal epidural space at L3-5, there is a fluid collection measuring up to 7 mm in thickness. This severely crowds the cauda equina nerve roots in the spinal canal. Paraspinal and other soft tissues: Left psoas abscess measures 1.6 x 8.0 cm. There are small paraspinal abscesses on the left at the L4 level. Disc levels: T12-L1: Small disc bulge without spinal canal stenosis. L1-2: Small disc bulge with endplate spurring.  No stenosis. L2-3: Normal. L3-4: Moderate facet hypertrophy and small disc bulge with moderate spinal canal stenosis. L4-5: Severe facet arthrosis and small disc bulge with moderate spinal canal stenosis. No neural foraminal stenosis. L5-S1: Severe facet arthrosis without spinal  canal or neural foraminal stenosis. IMPRESSION: 1. 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 There is moderate attenuation of the thecal sac. 2. Left psoas abscess and small left posterior paraspinal muscle abscess. Electronically Signed   By: KUlyses JarredM.D.   On: 07/14/2021 22:54   CT ABDOMEN PELVIS W CONTRAST  Result Date: 07/29/2021 CLINICAL DATA:  66year old female with a history of left psoas and left posterior paraspinal muscle abscesses. She had a percutaneous drain placed on 07/16/2021 and presents today clinically improved for repeat imaging. EXAM: CT ABDOMEN AND PELVIS WITH CONTRAST TECHNIQUE: Multidetector CT imaging of the abdomen and pelvis was performed using the standard protocol following bolus administration of intravenous contrast. RADIATION DOSE REDUCTION: This exam was performed according to the departmental dose-optimization program which includes automated exposure control, adjustment of the mA and/or kV  according to patient size and/or use of iterative reconstruction technique. CONTRAST:  134m ISOVUE-300 IOPAMIDOL (ISOVUE-300) INJECTION 61% COMPARISON:  MRI thoracic and lumbar spine 07/14/2021; CT-guided drain placement images 07/16/2021. FINDINGS: Lower chest: The lung bases are clear. Visualized cardiac structures are within normal limits for size. No pericardial effusion. Unremarkable visualized distal thoracic esophagus. Hepatobiliary: No focal liver abnormality is seen. No gallstones, gallbladder wall thickening, or biliary dilatation. Pancreas: Unremarkable. No pancreatic ductal dilatation or surrounding inflammatory changes. Spleen: Normal in size without focal abnormality. Adrenals/Urinary Tract: Adrenal glands are unremarkable. Kidneys are normal, without renal calculi, focal lesion, or hydronephrosis. Bladder is unremarkable. Stomach/Bowel: No focal bowel wall thickening or evidence of obstruction. Vascular/Lymphatic: Tortuous abdominal aorta with scattered  atherosclerotic calcifications. No aneurysm. No suspicious lymphadenopathy. Reproductive: Status post hysterectomy. No adnexal masses. Other: No abdominal wall hernia or abnormality. No abdominopelvic ascites. Musculoskeletal: Decreasing size of left paraspinal muscle peripherally enhancing fluid collection now measuring 2.0 x 0.9 cm compared to 2.8 x 1.6 cm previously. Left psoas drain in good position. Total interval resolution of abscess cavity from the left iliopsoas musculature. Multifocal degenerative disc disease and bilateral lower lumbar facet arthropathy without focality. IMPRESSION: 1. Resolved left psoas abscess with percutaneous drain in position. 2. Significant interval decrease in left paraspinal muscle abscess now measuring 2.0 x 0.9 cm compared to 2.8 x 1.6 cm previously. Electronically Signed   By: HJacqulynn CadetM.D.   On: 07/29/2021 11:12   MR FOOT LEFT WO CONTRAST  Result Date: 07/14/2021 CLINICAL DATA:  Foot swelling, diabetic, osteomyelitis suspected, xray done EXAM: MRI OF THE LEFT FOOT WITHOUT CONTRAST TECHNIQUE: Multiplanar, multisequence MR imaging of the left forefoot was performed. No intravenous contrast was administered. COMPARISON:  X-ray 07/12/2021 FINDINGS: Technical Note: Despite efforts by the technologist and patient, motion artifact is present on today's exam and could not be eliminated. This reduces exam sensitivity and specificity. Bones/Joint/Cartilage Severe arthropathy of the first MTP joint with complete full-thickness cartilage loss, subchondral sclerosis/cystic change, and bulky marginal osteophyte formation. Possible fragmentation of dorsal osteophyte of the base of the great toe proximal phalanx. Moderate-large first MTP joint effusion, nonspecific. Subchondral marrow edema on both sides of the joint may be degenerative/reactive although changes related to infection would have a similar appearance. Moderate degenerative changes across the tarsometatarsal joints.  Bone marrow edema within the fourth and fifth metatarsal bases and to a lesser degree at the second metatarsal proximal diaphysis are favored to represent a combination of reactive/degenerative changes and stress-related changes. Moderate naviculocuneiform arthropathy. No malalignment. Ligaments Intact Lisfranc ligament.  First MTP joint capsular thickening. Muscles and Tendons Edema-like signal throughout the foot musculature. Focal intramuscular fluid collection within the adductor hallucis muscle adjacent to the first metatarsal diaphysis measuring 3.4 x 1.2 x 1.5 cm (series 5, image 31; series 9, images 13-15). No significant tenosynovial fluid collection. Soft tissues Generalized subcutaneous edema, most pronounced over the dorsum of the forefoot. Additional focal fluid collection adjacent to the medial margin of the first metatarsal neck measuring 1.7 x 0.6 x 1.5 cm. No ulceration is seen. IMPRESSION: 1. 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 Moderate-large first MTP joint effusion, nonspecific and may be degenerative/reactive, related to an underlying inflammatory arthropathy, or represent septic arthritis. 2. Marrow edema within the first metatarsal head and great toe proximal phalanx could also be reactive/degenerative or secondary to osteomyelitis in the setting of infection. 3. Focal intramuscular fluid collection within the adductor hallucis muscle adjacent to the first metatarsal diaphysis measuring 3.4 x  1.2 x 1.5 cm. Findings are suspicious for intramuscular abscess. 4. Additional probable abscess along the medial margin of the first metatarsal neck measuring up to 1.7 cm. 5. Bone marrow edema within the fourth and fifth metatarsal bases and to a lesser degree at the second metatarsal proximal diaphysis are favored to represent a combination of reactive/degenerative changes and stress-related changes. Electronically Signed   By: Davina Poke D.O.   On: 07/14/2021 08:28   DG Foot Complete  Left  Result Date: 07/14/2021 CLINICAL DATA:  Postoperative check. EXAM: LEFT FOOT - COMPLETE 3+ VIEW COMPARISON:  Jul 12, 2021. FINDINGS: Status post osteotomy of distal first metatarsal as well as a portion of the first proximal phalanx. Surgically placed beads are noted in the region of the first metatarsophalangeal joint. IMPRESSION: Postsurgical changes as described above. Electronically Signed   By: Marijo Conception M.D.   On: 07/14/2021 18:37   DG Foot Complete Left  Result Date: 07/12/2021 CLINICAL DATA:  Left foot pain with erythema and swelling of the MCP joint for 4-5 days. Question gout. EXAM: LEFT FOOT - COMPLETE 3+ VIEW COMPARISON:  None Available. FINDINGS: The mineralization and alignment are normal. There is no evidence of acute fracture or dislocation. 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 No erosive changes or soft tissue calcifications are identified. Mild midfoot degenerative changes. There is mild forefoot soft tissue swelling. IMPRESSION: No acute osseous findings or specific radiographic findings of gout. Advanced degenerative changes at the 1st metatarsophalangeal joint with mild nonspecific forefoot soft tissue swelling. Electronically Signed   By: Richardean Sale M.D.   On: 07/12/2021 09:39   DG C-Arm 1-60 Min-No Report  Result Date: 07/16/2021 Fluoroscopy was utilized by the requesting physician.  No radiographic interpretation.   ECHOCARDIOGRAM COMPLETE  Result Date: 07/14/2021    ECHOCARDIOGRAM REPORT   Patient Name:   TESSICA CUPO Date of Exam: 07/14/2021 Medical Rec #:  [id number]       Height:       65.0 in Accession #:    [id number]      Weight:       133.0 lb Date of Birth:  Apr 22, 1955       BSA:          1.663 m Patient Age:    71 years        BP:           168/91 mmHg Patient Gender: F               HR:           102 bpm. Exam Location:  [facility] Procedure: 2D Echo, Cardiac Doppler and Color Doppler  Indications:     Bacteremia R78.81  History:         Patient has no prior history of Echocardiogram examinations.                  Risk Factors:Hypertension.  Sonographer:     Sherrie Sport Referring Phys:  [id number] Jennye Boroughs Diagnosing Phys: Yolonda Kida MD IMPRESSIONS  1. Left ventricular ejection fraction, by estimation, is >75%. The left ventricle has hyperdynamic function. The left ventricle has no regional wall motion abnormalities. Left ventricular diastolic parameters are consistent with Grade I diastolic dysfunction (impaired relaxation).  2. Right ventricular systolic function is normal. The right ventricular size is normal.  3. The mitral valve is normal in structure. No evidence of mitral valve regurgitation.  4.  The aortic valve is grossly normal. Aortic valve regurgitation is not visualized. FINDINGS  Left Ventricle: Left ventricular ejection fraction, by estimation, is >75%. The left ventricle has hyperdynamic function. The left ventricle has no regional wall motion abnormalities. The left ventricular internal cavity size was normal in size. There is no left ventricular hypertrophy. Left ventricular diastolic parameters are consistent with Grade I diastolic dysfunction (impaired relaxation). Right Ventricle: The right ventricular size is normal. No increase in right ventricular wall thickness. Right ventricular systolic function is normal. Left Atrium: Left atrial size was normal in size. Right Atrium: Right atrial size was normal in size. Pericardium: There is no evidence of pericardial effusion. Mitral Valve: The mitral valve is normal in structure. No evidence of mitral valve regurgitation. MV peak gradient, 4.7 mmHg. The mean mitral valve gradient is 2.0 mmHg. Tricuspid Valve: The tricuspid valve is normal in structure. Tricuspid valve regurgitation is not demonstrated. Aortic Valve: The aortic valve is grossly normal. Aortic valve regurgitation is not visualized. Aortic regurgitation PHT  measures 471 msec. Aortic valve mean gradient measures 5.0 mmHg. Aortic valve peak gradient measures 11.0 mmHg. Aortic valve area, by  VTI measures 2.94 cm. Pulmonic Valve: The pulmonic valve was normal in structure. Pulmonic valve regurgitation is not visualized. Aorta: The ascending aorta was not well visualized. IAS/Shunts: No atrial level shunt detected by color flow Doppler.  LEFT VENTRICLE PLAX 2D LVIDd:         4.70 cm   Diastology LVIDs:         2.60 cm   LV e' medial:    6.31 cm/s LV PW:         1.10 cm   LV E/e' medial:  9.2 LV IVS:        0.80 cm   LV e' lateral:   10.90 cm/s LVOT diam:     2.00 cm   LV E/e' lateral: 5.3 LV SV:         77 LV SV Index:   46 LVOT Area:     3.14 cm  RIGHT VENTRICLE RV Basal diam:  3.60 cm RV S prime:     21.40 cm/s TAPSE (M-mode): 2.6 cm LEFT ATRIUM             Index        RIGHT ATRIUM           Index LA diam:        3.10 cm 1.86 cm/m   RA Area:     17.80 cm LA Vol (A2C):   62.1 ml 37.34 ml/m  RA Volume:   47.60 ml  28.62 ml/m LA Vol (A4C):   24.6 ml 14.79 ml/m LA Biplane Vol: 41.0 ml 24.65 ml/m  AORTIC VALVE AV Area (Vmax):    2.42 cm AV Area (Vmean):   3.02 cm AV Area (VTI):     2.94 cm AV Vmax:           166.00 cm/s AV Vmean:          101.000 cm/s AV VTI:            0.261 m AV Peak Grad:      11.0 mmHg AV Mean Grad:      5.0 mmHg LVOT Vmax:         128.00 cm/s LVOT Vmean:        97.000 cm/s LVOT VTI:          0.244 m LVOT/AV VTI ratio: 0.93 AI PHT:  471 msec  AORTA Ao Root diam: 3.20 cm MITRAL VALVE                TRICUSPID VALVE MV Area (PHT): 6.37 cm     TR Peak grad:   22.3 mmHg MV Area VTI:   4.53 cm     TR Vmax:        236.00 cm/s MV Peak grad:  4.7 mmHg MV Mean grad:  2.0 mmHg     SHUNTS MV Vmax:       1.08 m/s     Systemic VTI:  0.24 m MV Vmean:      67.7 cm/s    Systemic Diam: 2.00 cm MV Decel Time: 119 msec MV E velocity: 57.80 cm/s MV A velocity: 115.00 cm/s MV E/A ratio:  0.50 Dwayne D Callwood MD Electronically signed by Yolonda Kida MD Signature Date/Time: 07/14/2021/1:52:55 PM    Final    CT IMAGE GUIDED DRAINAGE BY PERCUTANEOUS CATHETER  Result Date: 07/16/2021 INDICATION: Left psoas abscess EXAM: CT-guided drain placement into left psoas abscess TECHNIQUE: Multidetector CT imaging of the abdomen and pelvis was performed following the standard protocol without IV contrast. RADIATION DOSE REDUCTION: This exam was performed according to the departmental dose-optimization program which includes automated exposure control, adjustment of the mA and/or kV according to patient size and/or use of iterative reconstruction technique. MEDICATIONS: The patient is currently admitted to the hospital and receiving intravenous antibiotics. The antibiotics were administered within an appropriate time frame prior to the initiation of the procedure. ANESTHESIA/SEDATION: Moderate (conscious) sedation was employed during this procedure. A total of Versed 1.5 mg and Fentanyl 75 mcg was administered intravenously by the radiology nurse. Total intra-service moderate Sedation Time: 22 minutes. The patient's level of consciousness and vital signs were monitored continuously by radiology nursing throughout the procedure under my direct supervision. COMPLICATIONS: None immediate. PROCEDURE: Informed written consent was obtained from the patient after a thorough discussion of the procedural risks, benefits and alternatives. All questions were addressed. Maximal Sterile Barrier Technique was utilized including caps, mask, sterile gowns, sterile gloves, sterile drape, hand hygiene and skin antiseptic. A timeout was performed prior to the initiation of the procedure. The patient was placed prone on the exam table. Limited CT of the abdomen and pelvis was performed for planning purposes, which again demonstrated a hypodense fluid collection within the left psoas muscle concerning for abscess. Skin entry site was marked, the overlying skin was prepped and draped  in the standard sterile fashion. Local analgesia was obtained with 1% lidocaine. Using intermittent CT fluoroscopy, a 18 gauge trocar needle was advanced towards the identified fluid collection using a posterior approach. Location was confirmed with CT and return of purulent material. An 035 Amplatz wire was advanced through the needle, over which the percutaneous tract was serially dilated to accommodate a 12 French locking multipurpose drainage catheter. Location was again confirmed with CT and return of additional purulent material. A total of approximately 20 mL of purulent material was aspirated and sent to the lab for microbiology analysis. The drainage catheter was secured to the skin using silk suture and a dressing. It was attached to bulb suction. The patient tolerated the procedure well without immediate complication. IMPRESSION: Successful CT-guided placement of a 12 French drainage catheter into the left psoas abscess. 20 mL of purulent material aspirated, with a sample sent to the lab for microbiology analysis. Electronically Signed   By: Albin Felling M.D.   On: 07/16/2021 13:40  [place] EKG SITE RITE  Result Date: 07/20/2021 If [facility] image not attached, placement could not be confirmed due to current cardiac rhythm.   Labs:  CBC: Recent Labs    07/15/21 0429 07/16/21 0617 07/17/21 0529 07/20/21 0409  WBC 24.7* 20.0* 12.9* 7.3  HGB 10.5* 10.9* 9.7* 8.3*  HCT 30.4* 31.9* 28.8* 25.2*  PLT 189 227 219 334    COAGS: Recent Labs    07/16/21 0617  INR 1.2    BMP: Recent Labs    07/13/21 0259 07/14/21 0424 07/15/21 0429 07/17/21 0529 07/19/21 0447 07/20/21 0409  NA 138  --  135 134*  --  137  K 3.3* 3.5 4.7 4.7  --  3.6  CL 102  --  102 102  --  107  CO2 26  --  26 24  --  22  GLUCOSE 110*  --  105* 103*  --  86  BUN 19  --  18 16  --  12  CALCIUM 8.7*  --  8.0* 8.2*  --  7.8*  CREATININE 0.52  --  0.50 0.43* 0.46 0.51  GFRNONAA >60  --  >60 >60 >60 >60     LIVER FUNCTION TESTS: Recent Labs    07/12/21 0850  BILITOT 1.5*  AST 20  ALT 39  ALKPHOS 384*  PROT 6.9  ALBUMIN 3.0*    TUMOR MARKERS: No results for input(s): AFPTM, CEA, CA199, CHROMGRNA in the last 8760 hours.  Assessment and Plan:  Left psoas abscess resolved by imaging and by clinical improvement.  The drainage catheter was removed.  No further IR follow-up.    Electronically Signed: Criselda Peaches 07/29/2021, 11:23 AM   I spent a total of  15 Minutes in face to face in clinical consultation, greater than 50% of which was counseling/coordinating care for left psoas abscess drain.

## 2021-08-10 ENCOUNTER — Ambulatory Visit: Payer: Medicare Other | Attending: Infectious Diseases | Admitting: Infectious Diseases

## 2021-08-10 ENCOUNTER — Encounter: Payer: Self-pay | Admitting: Infectious Diseases

## 2021-08-10 VITALS — BP 154/88 | HR 89 | Temp 97.1°F | Ht 62.0 in | Wt 132.0 lb

## 2021-08-10 DIAGNOSIS — Z7952 Long term (current) use of systemic steroids: Secondary | ICD-10-CM | POA: Diagnosis not present

## 2021-08-10 DIAGNOSIS — E785 Hyperlipidemia, unspecified: Secondary | ICD-10-CM | POA: Insufficient documentation

## 2021-08-10 DIAGNOSIS — R7881 Bacteremia: Secondary | ICD-10-CM | POA: Diagnosis not present

## 2021-08-10 DIAGNOSIS — M00072 Staphylococcal arthritis, left ankle and foot: Secondary | ICD-10-CM | POA: Diagnosis present

## 2021-08-10 DIAGNOSIS — A4902 Methicillin resistant Staphylococcus aureus infection, unspecified site: Secondary | ICD-10-CM

## 2021-08-10 DIAGNOSIS — Z79899 Other long term (current) drug therapy: Secondary | ICD-10-CM | POA: Insufficient documentation

## 2021-08-10 DIAGNOSIS — M3390 Dermatopolymyositis, unspecified, organ involvement unspecified: Secondary | ICD-10-CM | POA: Diagnosis not present

## 2021-08-10 DIAGNOSIS — Z7902 Long term (current) use of antithrombotics/antiplatelets: Secondary | ICD-10-CM | POA: Diagnosis not present

## 2021-08-10 DIAGNOSIS — B9562 Methicillin resistant Staphylococcus aureus infection as the cause of diseases classified elsewhere: Secondary | ICD-10-CM | POA: Diagnosis not present

## 2021-08-10 DIAGNOSIS — I10 Essential (primary) hypertension: Secondary | ICD-10-CM | POA: Insufficient documentation

## 2021-08-10 DIAGNOSIS — D649 Anemia, unspecified: Secondary | ICD-10-CM | POA: Diagnosis not present

## 2021-08-10 MED ORDER — SULFAMETHOXAZOLE-TRIMETHOPRIM 800-160 MG PO TABS: 1.0000 | ORAL_TABLET | Freq: Two times a day (BID) | ORAL | 0 refills | Status: DC

## 2021-08-10 NOTE — Patient Instructions (Addendum)
You are here for follow up of the MRSA infeciton - you are on Iv daptomycin You will complete on 08/29/21- You should start taking bactrim ( trimethoprim+ sulfa) 1 tablet twice a day from 08/30/21 and 1 weeks after need to do blood work to check Potassium and kidney function- while on antibiotic drink plenty of water Will follow up 1 month 0

## 2021-08-10 NOTE — Progress Notes (Signed)
NAME: Christy Ray  DOB: Oct 16, 1955  MRN: [id number]  Date/Time: 08/10/2021 9:55 AM  Subjective:   ? Christy Ray is a 67 y.o. 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) Underwent lumbar laminotomy on 07/16/21 Repeat blood culture from 07/14/21 and 07/18/21 negative Was discharged home on Dapto ( after a few days of dual MRSA coverage) to complete 6 weeks on 08/29/21 She is here for follow up Doing much better Back pain is much better Left great toe is painful- [podiatrist Is concerned that there could be remaining infection)   Past Medical History:  Diagnosis Date   Dermatomyositis ([place])    Polyarthritis     Past Surgical History:  Procedure Laterality Date   INCISION AND DRAINAGE OF WOUND Left 07/14/2021   Procedure: IRRIGATION AND DEBRIDEMENT WOUND;  Surgeon: Caroline More, DPM;  Location: [facility] ORS;  Service: Podiatry;  Laterality: Left;   IR RADIOLOGIST EVAL & MGMT  07/29/2021   LUMBAR LAMINECTOMY FOR EPIDURAL ABSCESS N/A 07/16/2021   Procedure: LUMBAR LAMINECTOMY FOR EPIDURAL ABSCESS;  Surgeon: Meade Maw, MD;  Location: [facility] ORS;  Service: Neurosurgery;  Laterality: N/A;   OSTECTOMY Left 07/14/2021   Procedure: JOINT RESECTION;  Surgeon: Caroline More, DPM;  Location: [facility] ORS;  Service: Podiatry;  Laterality: Left;    Social History   Socioeconomic History   Marital status: Married    Spouse name: Not on file   Number of children: Not on file   Years of education: Not on file   Highest education level: Not on file  Occupational History   Not on file   Tobacco Use   Smoking status: Former    Packs/day: 0.50    Types: Cigarettes    Quit date: 07/14/2021    Years since quitting: 0.0   Smokeless tobacco: Never  Substance and Sexual Activity   Alcohol use: No   Drug use: Never   Sexual activity: Not on file  Other Topics Concern   Not on file  Social History Narrative   Not on file   Social Determinants of Health   Financial Resource Strain: Not on file  Food Insecurity: Not on file  Transportation Needs: Not on file  Physical Activity: Not on file  Stress: Not on file  Social Connections: Not on file  Intimate Partner Violence: Not on file    No family history on file. No Known Allergies I? Current Outpatient Medications  Medication Sig Dispense Refill   amLODipine (NORVASC) 2.5 MG tablet Take 2.5 mg by mouth daily.     daptomycin (CUBICIN) IVPB Inject 600 mg into the vein daily. Indication:  MRSA bacteremia and multiple abscess First Dose: Yes Last Day of Therapy:  08/29/2021 Labs - Once weekly:  CBC/D, CMP, ESR, CRP and CPK Please pull PIC at completion of IV antibiotics Fax weekly labs to  Dr.Kymberley Raz [phone] And  [facility] pharmacist at [phone] Method of administration: IV Push Method of administration may be changed at the discretion of home infusion pharmacist based upon assessment of the patient and/or caregiver's ability to self-administer the medication ordered. 40 Units 0   folic acid (FOLVITE)  1 MG tablet Take 1 mg by mouth daily.     hydrOXYzine (ATARAX) 10 MG tablet Take 10 mg by mouth at bedtime.     losartan (COZAAR) 100 MG tablet Take 1 tablet (100 mg total) by mouth daily. 30 tablet 0   predniSONE (DELTASONE) 10 MG tablet Take 1 tablet (10 mg total) by mouth daily. 30 tablet 0   rosuvastatin (CRESTOR) 10 MG tablet Take 10 mg by mouth daily.     senna-docusate (SENOKOT-S) 8.6-50 MG tablet Take 2 tablets by mouth 2 (two) times daily as needed for mild constipation. 100 tablet 0   No current  facility-administered medications for this visit.     Abtx:  Anti-infectives (From admission, onward)    None       REVIEW OF SYSTEMS:  Const: negative fever, negative chills, negative weight loss Eyes: negative diplopia or visual changes, negative eye pain ENT: negative coryza, negative sore throat Resp: negative cough, hemoptysis, dyspnea Cards: negative for chest pain, palpitations, lower extremity edema GU: negative for frequency, dysuria and hematuria GI: Negative for abdominal pain, diarrhea, bleeding, constipation Skin: negative for rash and pruritus Heme: negative for easy bruising and gum/nose bleeding MS: pain left foot Neurolo:negative for headaches, dizziness, vertigo, memory problems  Psych: negative for feelings of anxiety, depression  Endocrine: negative for thyroid, diabetes Allergy/Immunology- negative for any medication or food allergies ? Objective:  VITALS:  BP (!) 154/88   Pulse 89   Temp (!) 97.1 F (36.2 C) (Temporal)   Ht $R'5\' 2"'bA$  (1.575 m)   Wt 132 lb (59.9 kg)   BMI 24.14 kg/m  LDA Rt PICC PHYSICAL EXAM:  General: Alert, cooperative, no distress, appears stated age.  Head: Normocephalic, without obvious abnormality, atraumatic. Eyes: Conjunctivae clear, anicteric sclerae. Pupils are equal ENT Nares normal. No drainage or sinus tenderness. Lips, mucosa, and tongue normal. No Thrush Neck: Supple, symmetrical, no adenopathy, thyroid: non tender no carotid bruit and no JVD. Back: No CVA tenderness. Lungs: Clear to auscultation bilaterally. No Wheezing or Rhonchi. No rales. Heart: Regular rate and rhythm, no murmur, rub or gallop. Abdomen: Soft, non-tender,not distended. Bowel sounds normal. No masses Extremities:  Left great toe- some swelling   Skin: hypopigmented skin scars back   Lymph: Cervical, supraclavicular normal. Neurologic: Grossly non-focal Pertinent Labs ESR 89 Cr0.5 Alk po4 241 HB 10.6 CK 38 Alkpo4  241    ? 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 Underwent lumbar laminotomy Patient is currently on daptomycin and will complete 6 weeks of treatment on 08/29/2021 She is doing much better After completion of IV antibiotic PICC line will be removed and she will be placed on sulfamethoxazole trimethoprim for 30 days.  I will follow her as outpatient in 4 weeks.  Anemia improving  Patient has dermatomyositis.  Was on CellCept which has been on hold Currently on prednisone  Hypertension on amlodipine and losartan  Hyperlipidemia on rosuvastatin  ___________________________________________________ Discussed with patient in detail. Follow-up next month. Note:  This document was prepared using Dragon voice recognition software and may include unintentional dictation errors.

## 2021-09-02 ENCOUNTER — Emergency Department
Admission: EM | Admit: 2021-09-02 | Discharge: 2021-09-02 | Disposition: A | Discharge status: Home / Self Care | Payer: Medicare Other | Attending: Emergency Medicine | Admitting: Emergency Medicine

## 2021-09-02 ENCOUNTER — Other Ambulatory Visit: Payer: Self-pay

## 2021-09-02 DIAGNOSIS — Z711 Person with feared health complaint in whom no diagnosis is made: Secondary | ICD-10-CM | POA: Diagnosis not present

## 2021-09-02 DIAGNOSIS — R799 Abnormal finding of blood chemistry, unspecified: Secondary | ICD-10-CM | POA: Diagnosis present

## 2021-09-02 DIAGNOSIS — Z8614 Personal history of Methicillin resistant Staphylococcus aureus infection: Secondary | ICD-10-CM | POA: Diagnosis not present

## 2021-09-02 HISTORY — DX: Methicillin resistant Staphylococcus aureus infection, unspecified site: A49.02

## 2021-09-02 LAB — COMPREHENSIVE METABOLIC PANEL
ALT: 16 U/L (ref 0–44)
AST: 20 U/L (ref 15–41)
Albumin: 3.6 g/dL (ref 3.5–5.0)
Alkaline Phosphatase: 219 U/L — ABNORMAL HIGH (ref 38–126)
Anion gap: 8 (ref 5–15)
BUN: 25 mg/dL — ABNORMAL HIGH (ref 8–23)
CO2: 25 mmol/L (ref 22–32)
Calcium: 9.6 mg/dL (ref 8.9–10.3)
Chloride: 106 mmol/L (ref 98–111)
Creatinine, Ser: 0.92 mg/dL (ref 0.44–1.00)
GFR, Estimated: >60 mL/min (ref >60)
Glucose, Bld: 103 mg/dL — ABNORMAL HIGH (ref 70–99)
Potassium: 3.8 mmol/L (ref 3.5–5.1)
Sodium: 139 mmol/L (ref 135–145)
Total Bilirubin: 0.6 mg/dL (ref 0.3–1.2)
Total Protein: 7.8 g/dL (ref 6.5–8.1)

## 2021-09-02 LAB — CBC
HCT: 36.4 % (ref 36.0–46.0)
Hemoglobin: 11.1 g/dL — ABNORMAL LOW (ref 12.0–15.0)
MCH: 25.7 pg — ABNORMAL LOW (ref 26.0–34.0)
MCHC: 30.5 g/dL (ref 30.0–36.0)
MCV: 84.3 fL (ref 80.0–100.0)
Platelets: 233 10*3/uL (ref 150–400)
RBC: 4.32 MIL/uL (ref 3.87–5.11)
RDW: 15.1 % (ref 11.5–15.5)
WBC: 6.6 10*3/uL (ref 4.0–10.5)
nRBC: 0 % (ref 0.0–0.2)

## 2021-09-02 LAB — URINALYSIS, ROUTINE W REFLEX MICROSCOPIC
Bilirubin Urine: NEGATIVE
Glucose, UA: NEGATIVE mg/dL
Hgb urine dipstick: NEGATIVE
Ketones, ur: NEGATIVE mg/dL
Leukocytes,Ua: NEGATIVE
Nitrite: NEGATIVE
Protein, ur: NEGATIVE mg/dL
Specific Gravity, Urine: 1.006 (ref 1.005–1.030)
pH: 6 (ref 5.0–8.0)

## 2021-09-02 NOTE — Discharge Instructions (Addendum)
Continue to take your regular medications and restart the antibiotic

## 2021-09-02 NOTE — ED Triage Notes (Signed)
Pt has a PICC line for iv abx therapy for MRSA infection, states they just switched her to oral and drew her labs and called her back today with a K+ 7.5 and told her to come to the ED for treatment, pt denies any chest pain or other sx. Pt is ambulatory with a steady gait. No distress noted on arrival

## 2021-09-02 NOTE — ED Provider Notes (Addendum)
[facility] Provider Note    Event Date/Time   First MD Initiated Contact with Patient 09/02/21 1344     (approximate)   History   Abnormal Lab   HPI  Christy Ray is a 67 y.o. female with history of MRSA and recent PICC line presents emergency department stating that the home health nurse told her to come to the ED as her potassium was 7.5 on a blood draw from yesterday.  Patient states she has no symptoms.  She does not have any chest pain, shortness of breath, heart racing, weakness or pain.      Physical Exam   Triage Vital Signs: ED Triage Vitals  Enc Vitals Group     BP 09/02/21 1327 138/86     Pulse Rate 09/02/21 1327 93     Resp 09/02/21 1327 16     Temp 09/02/21 1327 98.1 F (36.7 C)     Temp Source 09/02/21 1327 Oral     SpO2 09/02/21 1327 96 %     Weight --      Height --      Head Circumference --      Peak Flow --      Pain Score 09/02/21 1313 0     Pain Loc --      Pain Edu? --      Excl. in GC? --     Most recent vital signs: Vitals:   09/02/21 1327  BP: 138/86  Pulse: 93  Resp: 16  Temp: 98.1 F (36.7 C)  SpO2: 96%     General: Awake, no distress.   CV:  Good peripheral perfusion. regular rate and  rhythm Resp:  Normal effort. Lungs CTA Abd:  No distention.   Other:      ED Results / Procedures / Treatments   Labs (all labs ordered are listed, but only abnormal results are displayed) Labs Reviewed  CBC - Abnormal; Notable for the following components:      Result Value   Hemoglobin 11.1 (*)    MCH 25.7 (*)    All other components within normal limits  COMPREHENSIVE METABOLIC PANEL - Abnormal; Notable for the following components:   Glucose, Bld 103 (*)    BUN 25 (*)    Alkaline Phosphatase 219 (*)    All other components within normal limits  URINALYSIS, ROUTINE W REFLEX MICROSCOPIC - Abnormal; Notable for the following components:   Color, Urine STRAW (*)    APPearance CLEAR (*)    All other  components within normal limits     EKG  EKG   RADIOLOGY     PROCEDURES:   Procedures   MEDICATIONS ORDERED IN ED: Medications - No data to display   IMPRESSION / MDM / ASSESSMENT AND PLAN / ED COURSE  I reviewed the triage vital signs and the nursing notes.                              Differential diagnosis includes, but is not limited to, hyperkalemia, hypokalemia, lab error  Patient's presentation is most consistent with acute presentation with potential threat to life or bodily function.   EKG shows normal sinus rhythm, see physician.  Labs are reassuring, CBC urinalysis normal, awaiting metabolic panel  Metabolic panel is normal.  Patient's potassium is at a normal level.  Feel that the original reading of 7.5 by her physician is a lab error.  Consult to Dr. Daleen Bo, infectious disease.  Since the potassium is normal she would like for her to continue taking her Bactrim.  All results were explained to the patient.  She is agreement treatment plan.  She was discharged stable condition.   FINAL CLINICAL IMPRESSION(S) / ED DIAGNOSES   Final diagnoses:  No problem, feared complaint unfounded  History of MRSA infection     Rx / DC Orders   ED Discharge Orders     None        Note:  This document was prepared using Dragon voice recognition software and may include unintentional dictation errors.    Faythe Ghee, PA-C 09/02/21 1407    Sherrie Mustache Roselyn Bering, PA-C 09/02/21 1410    Sharman Cheek, MD 09/05/21 0001

## 2021-09-09 ENCOUNTER — Ambulatory Visit: Payer: Medicare Other | Attending: Infectious Diseases | Admitting: Infectious Diseases

## 2021-09-09 ENCOUNTER — Other Ambulatory Visit
Admission: RE | Admit: 2021-09-09 | Discharge: 2021-09-09 | Disposition: A | Discharge status: Home / Self Care | Payer: Medicare Other | Attending: Infectious Diseases | Admitting: Infectious Diseases

## 2021-09-09 ENCOUNTER — Telehealth: Payer: Self-pay

## 2021-09-09 VITALS — BP 116/75 | HR 80 | Temp 97.4°F | Ht 65.0 in | Wt 134.0 lb

## 2021-09-09 DIAGNOSIS — E785 Hyperlipidemia, unspecified: Secondary | ICD-10-CM | POA: Diagnosis not present

## 2021-09-09 DIAGNOSIS — Z7901 Long term (current) use of anticoagulants: Secondary | ICD-10-CM | POA: Insufficient documentation

## 2021-09-09 DIAGNOSIS — M00872 Arthritis due to other bacteria, left ankle and foot: Secondary | ICD-10-CM

## 2021-09-09 DIAGNOSIS — A4902 Methicillin resistant Staphylococcus aureus infection, unspecified site: Secondary | ICD-10-CM | POA: Insufficient documentation

## 2021-09-09 DIAGNOSIS — M3313 Other dermatomyositis without myopathy: Secondary | ICD-10-CM | POA: Insufficient documentation

## 2021-09-09 DIAGNOSIS — I10 Essential (primary) hypertension: Secondary | ICD-10-CM | POA: Insufficient documentation

## 2021-09-09 DIAGNOSIS — Z79899 Other long term (current) drug therapy: Secondary | ICD-10-CM | POA: Insufficient documentation

## 2021-09-09 LAB — COMPREHENSIVE METABOLIC PANEL
ALT: 15 U/L (ref 0–44)
AST: 20 U/L (ref 15–41)
Albumin: 3.4 g/dL — ABNORMAL LOW (ref 3.5–5.0)
Alkaline Phosphatase: 173 U/L — ABNORMAL HIGH (ref 38–126)
Anion gap: 8 (ref 5–15)
BUN: 26 mg/dL — ABNORMAL HIGH (ref 8–23)
CO2: 24 mmol/L (ref 22–32)
Calcium: 9 mg/dL (ref 8.9–10.3)
Chloride: 105 mmol/L (ref 98–111)
Creatinine, Ser: 0.94 mg/dL (ref 0.44–1.00)
GFR, Estimated: >60 mL/min (ref >60)
Glucose, Bld: 90 mg/dL (ref 70–99)
Potassium: 4 mmol/L (ref 3.5–5.1)
Sodium: 137 mmol/L (ref 135–145)
Total Bilirubin: 0.2 mg/dL — ABNORMAL LOW (ref 0.3–1.2)
Total Protein: 7.1 g/dL (ref 6.5–8.1)

## 2021-09-09 LAB — CBC WITH DIFFERENTIAL/PLATELET
Abs Immature Granulocytes: 0.02 10*3/uL (ref 0.00–0.07)
Basophils Absolute: 0.1 10*3/uL (ref 0.0–0.1)
Basophils Relative: 2 %
Eosinophils Absolute: 0.3 10*3/uL (ref 0.0–0.5)
Eosinophils Relative: 6 %
HCT: 36.6 % (ref 36.0–46.0)
Hemoglobin: 11.2 g/dL — ABNORMAL LOW (ref 12.0–15.0)
Immature Granulocytes: 0 %
Lymphocytes Relative: 31 %
Lymphs Abs: 1.5 10*3/uL (ref 0.7–4.0)
MCH: 25.5 pg — ABNORMAL LOW (ref 26.0–34.0)
MCHC: 30.6 g/dL (ref 30.0–36.0)
MCV: 83.4 fL (ref 80.0–100.0)
Monocytes Absolute: 0.6 10*3/uL (ref 0.1–1.0)
Monocytes Relative: 12 %
Neutro Abs: 2.4 10*3/uL (ref 1.7–7.7)
Neutrophils Relative %: 49 %
Platelets: 201 10*3/uL (ref 150–400)
RBC: 4.39 MIL/uL (ref 3.87–5.11)
RDW: 15.6 % — ABNORMAL HIGH (ref 11.5–15.5)
WBC: 4.8 10*3/uL (ref 4.0–10.5)
nRBC: 0 % (ref 0.0–0.2)

## 2021-09-09 LAB — SEDIMENTATION RATE: Sed Rate: 40 mm/hr — ABNORMAL HIGH (ref 0–30)

## 2021-09-09 LAB — C-REACTIVE PROTEIN: CRP: 0.8 mg/dL (ref <1.0)

## 2021-09-09 NOTE — Telephone Encounter (Signed)
-----   Message from Lynn Ito, MD sent at 09/09/2021  3:10 PM EDT ----- Please let her know that labs look good and she should complete the month of bactrim . She will finish 1st week of august and wont need any more antibiotic ----- Message ----- From: Leory Plowman, Lab In [place] Sent: 09/09/2021   9:14 AM EDT To: Lynn Ito, MD

## 2021-09-09 NOTE — Telephone Encounter (Signed)
Patient made aware of most recent lab results. She is excited to receive this news and was appreciative of call. Christy Ray

## 2021-09-09 NOTE — Progress Notes (Signed)
NAME: Christy Ray  DOB: [birth year]  MRN: [id number]  Date/Time: 09/09/2021 8:39 AM  Subjective:   ?pt here for follow up  MRSA disseminated infection. After completing IV antibiotic for 6 weeks she is now on bactrim PO since 1 st week of July- doing much better- no pain back or foot Visited her grandkids in [place] last weekend There was one lab where K was > 7 and she came to the ED and repeat K on 7/6 was 3.8  Following taken from previous note Christy Ray is a 66 y.o. 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) Underwent lumbar laminotomy on 07/16/21 Repeat blood culture from 07/14/21 and 07/18/21 negative Was discharged home on Dapto ( after a few days of dual MRSA coverage) and completed 6 weeks on 08/29/21   Past Medical History:  Diagnosis Date   Dermatomyositis ([place])    MRSA (methicillin resistant Staphylococcus aureus)    Polyarthritis     Past Surgical History:  Procedure Laterality Date   INCISION AND DRAINAGE OF WOUND Left 07/14/2021   Procedure: IRRIGATION AND DEBRIDEMENT WOUND;  Surgeon: Caroline More, DPM;  Location: [facility] ORS;  Service: Podiatry;  Laterality: Left;   IR RADIOLOGIST EVAL & MGMT  07/29/2021   LUMBAR LAMINECTOMY FOR EPIDURAL ABSCESS N/A 07/16/2021   Procedure: LUMBAR LAMINECTOMY FOR EPIDURAL ABSCESS;  Surgeon: Meade Maw, MD;  Location: [facility] ORS;  Service: Neurosurgery;  Laterality: N/A;   OSTECTOMY Left 07/14/2021   Procedure: JOINT RESECTION;  Surgeon: Caroline More, DPM;  Location: [facility] ORS;  Service: Podiatry;  Laterality: Left;     Social History   Socioeconomic History   Marital status: Married    Spouse name: Not on file   Number of children: Not on file   Years of education: Not on file   Highest education level: Not on file  Occupational History   Not on file  Tobacco Use   Smoking status: Former    Packs/day: 0.50    Types: Cigarettes    Quit date: 07/14/2021    Years since quitting: 0.1   Smokeless tobacco: Never  Substance and Sexual Activity   Alcohol use: No   Drug use: Never   Sexual activity: Not on file  Other Topics Concern   Not on file  Social History Narrative   Not on file   Social Determinants of Health   Financial Resource Strain: Not on file  Food Insecurity: Not on file  Transportation Needs: Not on file  Physical Activity: Not on file  Stress: Not on file  Social Connections: Not on file  Intimate Partner Violence: Not on file    No family history on file. No Known Allergies I? Current Outpatient Medications  Medication Sig Dispense Refill   amLODipine (NORVASC) 2.5 MG tablet Take 2.5 mg by mouth daily.     folic acid (FOLVITE) 1 MG tablet Take 1 mg by mouth daily.     hydrOXYzine (ATARAX) 10 MG tablet Take 10 mg by mouth at bedtime.     losartan (COZAAR) 100 MG tablet Take 1 tablet (100 mg total) by mouth daily. 30 tablet 0  rosuvastatin (CRESTOR) 10 MG tablet Take 10 mg by mouth daily.     senna-docusate (SENOKOT-S) 8.6-50 MG tablet Take 2 tablets by mouth 2 (two) times daily as needed for mild constipation. 100 tablet 0   sulfamethoxazole-trimethoprim (BACTRIM DS) 800-160 MG tablet Take 1 tablet by mouth 2 (two) times daily. 60 tablet 0   No current facility-administered medications for this visit.     Abtx:  Anti-infectives (From admission, onward)    None       REVIEW OF SYSTEMS:  Const: negative fever, negative chills, negative weight loss Eyes: negative diplopia or visual changes, negative eye pain ENT: negative coryza, negative sore throat Resp:  negative cough, hemoptysis, dyspnea Cards: negative for chest pain, palpitations, lower extremity edema GU: negative for frequency, dysuria and hematuria GI: Negative for abdominal pain, diarrhea, bleeding, constipation Skin: negative for rash and pruritus Heme: negative for easy bruising and gum/nose bleeding MS: no pain left foot Neurolo:negative for headaches, dizziness, vertigo, memory problems  Psych: negative for feelings of anxiety, depression  Endocrine: negative for thyroid, diabetes Allergy/Immunology- negative for any medication or food allergies ? Objective:  VITALS:  BP 116/75   Pulse 80   Temp (!) 97.4 F (36.3 C) (Temporal)   Ht $R'5\' 5"'LJ$  (1.651 m)   Wt 134 lb (60.8 kg)   BMI 22.30 kg/m   PHYSICAL EXAM:  General: Alert, cooperative, no distress, appears stated age.  Head: Normocephalic, without obvious abnormality, atraumatic. Eyes: Conjunctivae clear, anicteric sclerae. Pupils are equal ENT Nares normal. No drainage or sinus tenderness. Lips, mucosa, and tongue normal. No Thrush Neck: Supple, symmetrical, no adenopathy, thyroid: non tender no carotid bruit and no JVD. Back: No CVA tenderness. Lungs: Clear to auscultation bilaterally. No Wheezing or Rhonchi. No rales. Heart: Regular rate and rhythm, no murmur, rub or gallop. Abdomen: Soft, non-tender,not distended. Bowel sounds normal. No masses Extremities:  Left great toe- swelling resolved   Skin: hypopigmented skin scars back  Lymph: Cervical, supraclavicular normal. Neurologic: Grossly non-focal Pertinent Labs None     ? 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 Underwent lumbar laminotomy Patient completed 6 weeks of IV daptomycin   on 08/29/2021 and now on PO batcrim She is doing much better Will do labs today and depending on ESR/CRP may extend bactrim -other wose she will complete Batcrim 1 st week of august  Anemia resolved  Patient has dermatomyositis.  Was on CellCept which has been on hold Currently on prednisone  Hypertension on amlodipine and losartan  Hyperlipidemia on rosuvastatin  ___________________________________________________ Discussed with patient in detail. Follow up PRN CRP- 0.8 ESR 40 K 4 Cr 0.94 Hb 11.2 PLT 201 WBC 4.8 Wont need bactrim beyond Aug 3rd  Note:  This document was prepared using Systems analyst and may include unintentional dictation errors.

## 2021-09-09 NOTE — Patient Instructions (Addendum)
You are herA for follow up for the disseminated MRSa infection- you are on bactrim DS I Bid until 1 st week of august- today will do labs and decide whether you need more antibiotic or not

## 2021-09-24 ENCOUNTER — Encounter: Payer: Self-pay | Admitting: Infectious Diseases

## 2021-10-12 ENCOUNTER — Other Ambulatory Visit
Admission: RE | Admit: 2021-10-12 | Discharge: 2021-10-12 | Disposition: A | Discharge status: Home / Self Care | Payer: Medicare Other | Attending: Infectious Diseases | Admitting: Infectious Diseases

## 2021-10-12 ENCOUNTER — Ambulatory Visit: Payer: Medicare Other | Attending: Infectious Diseases | Admitting: Infectious Diseases

## 2021-10-12 ENCOUNTER — Encounter: Payer: Self-pay | Admitting: Infectious Diseases

## 2021-10-12 VITALS — BP 121/79 | HR 91 | Temp 97.2°F | Ht 65.0 in | Wt 131.0 lb

## 2021-10-12 DIAGNOSIS — E785 Hyperlipidemia, unspecified: Secondary | ICD-10-CM | POA: Diagnosis not present

## 2021-10-12 DIAGNOSIS — Z79899 Other long term (current) drug therapy: Secondary | ICD-10-CM | POA: Insufficient documentation

## 2021-10-12 DIAGNOSIS — R7881 Bacteremia: Secondary | ICD-10-CM | POA: Diagnosis not present

## 2021-10-12 DIAGNOSIS — I10 Essential (primary) hypertension: Secondary | ICD-10-CM | POA: Diagnosis not present

## 2021-10-12 DIAGNOSIS — M3313 Other dermatomyositis without myopathy: Secondary | ICD-10-CM | POA: Insufficient documentation

## 2021-10-12 DIAGNOSIS — M545 Low back pain, unspecified: Secondary | ICD-10-CM | POA: Insufficient documentation

## 2021-10-12 DIAGNOSIS — Z9889 Other specified postprocedural states: Secondary | ICD-10-CM | POA: Diagnosis not present

## 2021-10-12 DIAGNOSIS — Z8614 Personal history of Methicillin resistant Staphylococcus aureus infection: Secondary | ICD-10-CM | POA: Insufficient documentation

## 2021-10-12 DIAGNOSIS — A4902 Methicillin resistant Staphylococcus aureus infection, unspecified site: Secondary | ICD-10-CM

## 2021-10-12 LAB — COMPREHENSIVE METABOLIC PANEL
ALT: 12 U/L (ref 0–44)
AST: 17 U/L (ref 15–41)
Albumin: 3.4 g/dL — ABNORMAL LOW (ref 3.5–5.0)
Alkaline Phosphatase: 301 U/L — ABNORMAL HIGH (ref 38–126)
Anion gap: 8 (ref 5–15)
BUN: 16 mg/dL (ref 8–23)
CO2: 26 mmol/L (ref 22–32)
Calcium: 9.1 mg/dL (ref 8.9–10.3)
Chloride: 106 mmol/L (ref 98–111)
Creatinine, Ser: 0.6 mg/dL (ref 0.44–1.00)
GFR, Estimated: >60 mL/min (ref >60)
Glucose, Bld: 70 mg/dL (ref 70–99)
Potassium: 3.5 mmol/L (ref 3.5–5.1)
Sodium: 140 mmol/L (ref 135–145)
Total Bilirubin: 0.5 mg/dL (ref 0.3–1.2)
Total Protein: 7.7 g/dL (ref 6.5–8.1)

## 2021-10-12 LAB — CBC WITH DIFFERENTIAL/PLATELET
Abs Immature Granulocytes: 0.02 10*3/uL (ref 0.00–0.07)
Basophils Absolute: 0.1 10*3/uL (ref 0.0–0.1)
Basophils Relative: 1 %
Eosinophils Absolute: 0.2 10*3/uL (ref 0.0–0.5)
Eosinophils Relative: 3 %
HCT: 36.2 % (ref 36.0–46.0)
Hemoglobin: 11.2 g/dL — ABNORMAL LOW (ref 12.0–15.0)
Immature Granulocytes: 0 %
Lymphocytes Relative: 22 %
Lymphs Abs: 1.5 10*3/uL (ref 0.7–4.0)
MCH: 25.1 pg — ABNORMAL LOW (ref 26.0–34.0)
MCHC: 30.9 g/dL (ref 30.0–36.0)
MCV: 81.2 fL (ref 80.0–100.0)
Monocytes Absolute: 0.7 10*3/uL (ref 0.1–1.0)
Monocytes Relative: 10 %
Neutro Abs: 4.4 10*3/uL (ref 1.7–7.7)
Neutrophils Relative %: 64 %
Platelets: 349 10*3/uL (ref 150–400)
RBC: 4.46 MIL/uL (ref 3.87–5.11)
RDW: 17.7 % — ABNORMAL HIGH (ref 11.5–15.5)
WBC: 6.9 10*3/uL (ref 4.0–10.5)
nRBC: 0 % (ref 0.0–0.2)

## 2021-10-12 NOTE — Progress Notes (Signed)
NAME: Christy Ray  DOB: 1955-10-13  MRN: [id number]  Date/Time: 10/12/2021 9:09 AM  Subjective:   ?pt here for follow up Doing   MRSA disseminated infection. After completing IV antibiotic for 6 weeks she went  on bactrim PO since 1 st week of July-and then had a rash at the end of treatment near 1 month and stopped on 09/23/21 ( instead of 09/30/21) She has dermatomyositis /polymyositis and has been on cellcept and prednisone for 3 months. That could explain the bump in ESR and CRP Some low back pain- which is possibly her baseline but she is concerned  Following taken from previous note Christy Ray is a 66 y.o. 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) Underwent lumbar laminotomy on 07/16/21 Repeat blood culture from 07/14/21 and 07/18/21 negative Was discharged home on Dapto ( after a few days of dual MRSA coverage) and completed 6 weeks on 08/29/21 and then went on Bactrim which caused a rash after 25 days Her PCP stopped it ( as she had only 5 days to complete)   Past Medical History:  Diagnosis Date   Dermatomyositis ([place])    MRSA (methicillin resistant Staphylococcus aureus)    Polyarthritis     Past Surgical History:  Procedure Laterality Date   INCISION AND DRAINAGE OF WOUND Left 07/14/2021   Procedure: IRRIGATION AND DEBRIDEMENT WOUND;  Surgeon: Caroline More, DPM;  Location: [facility];  Service: Podiatry;  Laterality: Left;   IR RADIOLOGIST EVAL & MGMT  07/29/2021   LUMBAR LAMINECTOMY FOR EPIDURAL ABSCESS N/A 07/16/2021   Procedure: LUMBAR LAMINECTOMY FOR EPIDURAL  ABSCESS;  Surgeon: Meade Maw, MD;  Location: [facility];  Service: Neurosurgery;  Laterality: N/A;   OSTECTOMY Left 07/14/2021   Procedure: JOINT RESECTION;  Surgeon: Caroline More, DPM;  Location: [facility];  Service: Podiatry;  Laterality: Left;    Social History   Socioeconomic History   Marital status: Married    Spouse name: Not on file   Number of children: Not on file   Years of education: Not on file   Highest education level: Not on file  Occupational History   Not on file  Tobacco Use   Smoking status: Former    Packs/day: 0.50    Types: Cigarettes    Quit date: 07/14/2021    Years since quitting: 0.2   Smokeless tobacco: Never  Substance and Sexual Activity   Alcohol use: No   Drug use: Never   Sexual activity: Not on file  Other Topics Concern   Not on file  Social History Narrative   Not on file   Social Determinants of Health   Financial Resource Strain: Not on file  Food Insecurity: Not on file  Transportation Needs: Not on file  Physical Activity: Not on file  Stress: Not on file  Social Connections: Not on file  Intimate Partner Violence: Not on file    No family history on file. Allergies  Allergen Reactions   Sulfa Antibiotics Rash   I? Current Outpatient Medications  Medication Sig Dispense Refill   amLODipine (NORVASC) 2.5 MG tablet Take 2.5 mg by mouth  daily.     folic acid (FOLVITE) 1 MG tablet Take 1 mg by mouth daily.     hydrOXYzine (ATARAX) 10 MG tablet Take 10 mg by mouth at bedtime.     losartan (COZAAR) 100 MG tablet Take 1 tablet (100 mg total) by mouth daily. 30 tablet 0   rosuvastatin (CRESTOR) 10 MG tablet Take 10 mg by mouth daily.     senna-docusate (SENOKOT-S) 8.6-50 MG tablet Take 2 tablets by mouth 2 (two) times daily as needed for mild constipation. 100 tablet 0   No current facility-administered medications for this visit.     Abtx:  Anti-infectives (From admission, onward)    None       REVIEW OF SYSTEMS:   Const: negative fever, negative chills, negative weight loss Eyes: negative diplopia or visual changes, negative eye pain ENT: negative coryza, negative sore throat Resp: negative cough, hemoptysis, dyspnea Cards: negative for chest pain, palpitations, lower extremity edema GU: negative for frequency, dysuria and hematuria GI: Negative for abdominal pain, diarrhea, bleeding, constipation Skin: negative for rash and pruritus Heme: negative for easy bruising and gum/nose bleeding MS: no pain left foot Neurolo:negative for headaches, dizziness, vertigo, memory problems  Psych: negative for feelings of anxiety, depression  Endocrine: negative for thyroid, diabetes Allergy/Immunology- negative for any medication or food allergies ? Objective:  VITALS:  BP 121/79   Pulse 91   Temp (!) 97.2 F (36.2 C) (Temporal)   Ht _0  (1.651 m)   Wt 131 lb (59.4 kg)   BMI 21.80 kg/m   PHYSICAL EXAM:  General: Alert, cooperative, no distress, appears stated age.  Head: Normocephalic, without obvious abnormality, atraumatic. Eyes: Conjunctivae clear, anicteric sclerae. Pupils are equal ENT Nares normal. No drainage or sinus tenderness. Lips, mucosa, and tongue normal. No Thrush Neck: Supple, symmetrical, no adenopathy, thyroid: non tender no carotid bruit and no JVD. Back: No CVA tenderness. Lungs: Clear to auscultation bilaterally. No Wheezing or Rhonchi. No rales. Heart: Regular rate and rhythm, no murmur, rub or gallop. Abdomen: Soft, non-tender,not distended. Bowel sounds normal. No masses Extremities:  Left great toe- swelling resolved   Skin: hypopigmented skin scars back  Lymph: Cervical, supraclavicular normal. Neurologic: Grossly non-focal Pertinent Labs None     ? 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 Underwent lumbar laminotomy Patient completed 6 weeks of IV daptomycin  on 08/29/2021 and almost 4 weeks of  PO bactrim on 09/23/21 ESR up to 70 ( from 38)  CRP 4.5  Autoimmune condition could be playing a role here as she is off those meds Will follow up with her PCP- if her back pain worsens and her ESR/CRP worsens then she will need imaging of the spine  Anemia resolved  Patient has dermatomyositis.  Was on CellCept and prednisone which has been on hold. Followed by Dr>Chu -Rheumatologist- last saw him 09/07/21   Hypertension on amlodipine and losartan  Hyperlipidemia on rosuvastatin Will do Blood culture today- nearly 3 weeks post antibiotic Follow PRN ________________________________________________   Note:  This document was prepared using Dragon voice recognition software and may include unintentional dictation errors.

## 2021-10-12 NOTE — Patient Instructions (Addendum)
You are here for follow up of the disseminated MRSA infection which has resolved- you completed nearly 10 weeks of antibiotics on 09/23/21- the infection has resolved Your latest ESR ( an inflammatory) marker from 8/11 is up at 71 . The autoimmune condition you have can be contributing to it as you are not taking those meds any more If your back pain worsens and ESR increases we may then want to do imaging of your back-  Will do a blood culture today

## 2021-10-17 LAB — CULTURE, BLOOD (ROUTINE X 2)
Culture: NO GROWTH
Culture: NO GROWTH
Special Requests: ADEQUATE

## 2021-10-18 ENCOUNTER — Telehealth: Payer: Self-pay

## 2021-10-18 NOTE — Telephone Encounter (Signed)
-----   Message from Lynn Ito, MD sent at 10/18/2021  1:55 PM EDT ----- Can you please let her know that the blood culture is negative- No MRSA. Thx  ----- Message ----- From: Leory Plowman, [facility] [place] Sent: 10/12/2021  10:23 AM EDT To: Lynn Ito, MD

## 2022-09-23 ENCOUNTER — Inpatient Hospital Stay: Payer: Medicare Other

## 2022-09-23 ENCOUNTER — Emergency Department: Payer: Medicare Other

## 2022-09-23 ENCOUNTER — Other Ambulatory Visit: Payer: Self-pay

## 2022-09-23 ENCOUNTER — Encounter: Payer: Self-pay | Admitting: Emergency Medicine

## 2022-09-23 ENCOUNTER — Inpatient Hospital Stay
Admission: EM | Admit: 2022-09-23 | Discharge: 2022-10-04 | DRG: 853 | Disposition: A | Discharge status: Home Health | Payer: Medicare Other | Attending: Internal Medicine | Admitting: Internal Medicine

## 2022-09-23 DIAGNOSIS — M109 Gout, unspecified: Secondary | ICD-10-CM | POA: Diagnosis present

## 2022-09-23 DIAGNOSIS — G062 Extradural and subdural abscess, unspecified: Secondary | ICD-10-CM | POA: Diagnosis not present

## 2022-09-23 DIAGNOSIS — Z803 Family history of malignant neoplasm of breast: Secondary | ICD-10-CM

## 2022-09-23 DIAGNOSIS — M3313 Other dermatomyositis without myopathy: Secondary | ICD-10-CM | POA: Diagnosis present

## 2022-09-23 DIAGNOSIS — K6812 Psoas muscle abscess: Secondary | ICD-10-CM | POA: Diagnosis not present

## 2022-09-23 DIAGNOSIS — R918 Other nonspecific abnormal finding of lung field: Secondary | ICD-10-CM | POA: Diagnosis not present

## 2022-09-23 DIAGNOSIS — E871 Hypo-osmolality and hyponatremia: Secondary | ICD-10-CM | POA: Diagnosis present

## 2022-09-23 DIAGNOSIS — I10 Essential (primary) hypertension: Secondary | ICD-10-CM | POA: Diagnosis present

## 2022-09-23 DIAGNOSIS — M6008 Infective myositis, other site: Secondary | ICD-10-CM | POA: Diagnosis present

## 2022-09-23 DIAGNOSIS — Z8249 Family history of ischemic heart disease and other diseases of the circulatory system: Secondary | ICD-10-CM | POA: Diagnosis not present

## 2022-09-23 DIAGNOSIS — M4656 Other infective spondylopathies, lumbar region: Secondary | ICD-10-CM | POA: Diagnosis not present

## 2022-09-23 DIAGNOSIS — A419 Sepsis, unspecified organism: Secondary | ICD-10-CM

## 2022-09-23 DIAGNOSIS — Z79899 Other long term (current) drug therapy: Secondary | ICD-10-CM | POA: Diagnosis not present

## 2022-09-23 DIAGNOSIS — Z8661 Personal history of infections of the central nervous system: Secondary | ICD-10-CM

## 2022-09-23 DIAGNOSIS — Z820 Family history of epilepsy and other diseases of the nervous system: Secondary | ICD-10-CM | POA: Diagnosis not present

## 2022-09-23 DIAGNOSIS — Z7960 Long term (current) use of unspecified immunomodulators and immunosuppressants: Secondary | ICD-10-CM | POA: Diagnosis not present

## 2022-09-23 DIAGNOSIS — R64 Cachexia: Secondary | ICD-10-CM | POA: Diagnosis present

## 2022-09-23 DIAGNOSIS — G834 Cauda equina syndrome: Secondary | ICD-10-CM | POA: Diagnosis present

## 2022-09-23 DIAGNOSIS — D638 Anemia in other chronic diseases classified elsewhere: Secondary | ICD-10-CM | POA: Diagnosis not present

## 2022-09-23 DIAGNOSIS — M4626 Osteomyelitis of vertebra, lumbar region: Secondary | ICD-10-CM | POA: Diagnosis present

## 2022-09-23 DIAGNOSIS — Z6821 Body mass index (BMI) 21.0-21.9, adult: Secondary | ICD-10-CM

## 2022-09-23 DIAGNOSIS — E785 Hyperlipidemia, unspecified: Secondary | ICD-10-CM | POA: Diagnosis not present

## 2022-09-23 DIAGNOSIS — K219 Gastro-esophageal reflux disease without esophagitis: Secondary | ICD-10-CM | POA: Diagnosis not present

## 2022-09-23 DIAGNOSIS — J853 Abscess of mediastinum: Secondary | ICD-10-CM

## 2022-09-23 DIAGNOSIS — E440 Moderate protein-calorie malnutrition: Secondary | ICD-10-CM | POA: Diagnosis present

## 2022-09-23 DIAGNOSIS — B9562 Methicillin resistant Staphylococcus aureus infection as the cause of diseases classified elsewhere: Secondary | ICD-10-CM | POA: Diagnosis not present

## 2022-09-23 DIAGNOSIS — G061 Intraspinal abscess and granuloma: Secondary | ICD-10-CM | POA: Diagnosis present

## 2022-09-23 DIAGNOSIS — I3139 Other pericardial effusion (noninflammatory): Secondary | ICD-10-CM | POA: Diagnosis present

## 2022-09-23 DIAGNOSIS — A4902 Methicillin resistant Staphylococcus aureus infection, unspecified site: Principal | ICD-10-CM

## 2022-09-23 DIAGNOSIS — Z882 Allergy status to sulfonamides status: Secondary | ICD-10-CM | POA: Diagnosis not present

## 2022-09-23 DIAGNOSIS — A4102 Sepsis due to Methicillin resistant Staphylococcus aureus: Secondary | ICD-10-CM | POA: Diagnosis not present

## 2022-09-23 DIAGNOSIS — M48061 Spinal stenosis, lumbar region without neurogenic claudication: Secondary | ICD-10-CM | POA: Diagnosis present

## 2022-09-23 DIAGNOSIS — R519 Headache, unspecified: Secondary | ICD-10-CM | POA: Insufficient documentation

## 2022-09-23 DIAGNOSIS — Z87891 Personal history of nicotine dependence: Secondary | ICD-10-CM

## 2022-09-23 DIAGNOSIS — E876 Hypokalemia: Secondary | ICD-10-CM | POA: Diagnosis not present

## 2022-09-23 DIAGNOSIS — D84821 Immunodeficiency due to drugs: Secondary | ICD-10-CM | POA: Diagnosis present

## 2022-09-23 DIAGNOSIS — Z791 Long term (current) use of non-steroidal anti-inflammatories (NSAID): Secondary | ICD-10-CM

## 2022-09-23 LAB — CBC WITH DIFFERENTIAL/PLATELET
Abs Immature Granulocytes: 0.46 10*3/uL — ABNORMAL HIGH (ref 0.00–0.07)
Basophils Absolute: 0.1 10*3/uL (ref 0.0–0.1)
Basophils Relative: 0 %
Eosinophils Absolute: 0 10*3/uL (ref 0.0–0.5)
Eosinophils Relative: 0 %
HCT: 36.8 % (ref 36.0–46.0)
Hemoglobin: 12.2 g/dL (ref 12.0–15.0)
Immature Granulocytes: 2 %
Lymphocytes Relative: 6 %
Lymphs Abs: 1.3 10*3/uL (ref 0.7–4.0)
MCH: 27.1 pg (ref 26.0–34.0)
MCHC: 33.2 g/dL (ref 30.0–36.0)
MCV: 81.6 fL (ref 80.0–100.0)
Monocytes Absolute: 1.3 10*3/uL — ABNORMAL HIGH (ref 0.1–1.0)
Monocytes Relative: 6 %
Neutro Abs: 20.3 10*3/uL — ABNORMAL HIGH (ref 1.7–7.7)
Neutrophils Relative %: 86 %
Platelets: 389 10*3/uL (ref 150–400)
RBC: 4.51 MIL/uL (ref 3.87–5.11)
RDW: 14.7 % (ref 11.5–15.5)
WBC: 23.4 10*3/uL — ABNORMAL HIGH (ref 4.0–10.5)
nRBC: 0 % (ref 0.0–0.2)

## 2022-09-23 LAB — LACTIC ACID, PLASMA
Lactic Acid, Venous: 1 mmol/L (ref 0.5–1.9)
Lactic Acid, Venous: 1 mmol/L (ref 0.5–1.9)

## 2022-09-23 LAB — CULTURE, FUNGUS WITHOUT SMEAR

## 2022-09-23 LAB — AEROBIC/ANAEROBIC CULTURE W GRAM STAIN (SURGICAL/DEEP WOUND)

## 2022-09-23 LAB — APTT: aPTT: 36 seconds (ref 24–36)

## 2022-09-23 LAB — C-REACTIVE PROTEIN: CRP: 11.9 mg/dL — ABNORMAL HIGH (ref <1.0)

## 2022-09-23 LAB — BASIC METABOLIC PANEL
Anion gap: 8 (ref 5–15)
BUN: 17 mg/dL (ref 8–23)
CO2: 26 mmol/L (ref 22–32)
Calcium: 8.8 mg/dL — ABNORMAL LOW (ref 8.9–10.3)
Chloride: 102 mmol/L (ref 98–111)
Creatinine, Ser: 0.58 mg/dL (ref 0.44–1.00)
GFR, Estimated: >60 mL/min (ref >60)
Glucose, Bld: 115 mg/dL — ABNORMAL HIGH (ref 70–99)
Potassium: 3.7 mmol/L (ref 3.5–5.1)
Sodium: 136 mmol/L (ref 135–145)

## 2022-09-23 LAB — SEDIMENTATION RATE: Sed Rate: 60 mm/hr — ABNORMAL HIGH (ref 0–30)

## 2022-09-23 LAB — PROTIME-INR
INR: 1.2 (ref 0.8–1.2)
Prothrombin Time: 15.5 seconds — ABNORMAL HIGH (ref 11.4–15.2)

## 2022-09-23 MED ORDER — AMLODIPINE BESYLATE 5 MG PO TABS
5.0000 mg | ORAL_TABLET | Freq: Every day | ORAL | Status: DC
  Administered 2022-09-23 – 2022-10-04 (×12): 5 mg via ORAL
  Filled 2022-09-23 (×12): qty 1

## 2022-09-23 MED ORDER — VANCOMYCIN HCL IN DEXTROSE 1-5 GM/200ML-% IV SOLN
1000.0000 mg | INTRAVENOUS | Status: DC
  Filled 2022-09-23: qty 200

## 2022-09-23 MED ORDER — VANCOMYCIN HCL 500 MG/100ML IV SOLN
500.0000 mg | Freq: Once | INTRAVENOUS | Status: AC
  Administered 2022-09-23: 500 mg via INTRAVENOUS
  Filled 2022-09-23: qty 100

## 2022-09-23 MED ORDER — ROSUVASTATIN CALCIUM 10 MG PO TABS
10.0000 mg | ORAL_TABLET | Freq: Every day | ORAL | Status: DC
  Administered 2022-09-23 – 2022-10-03 (×11): 10 mg via ORAL
  Filled 2022-09-23 (×11): qty 1

## 2022-09-23 MED ORDER — ACETAMINOPHEN 325 MG PO TABS: 650.0000 mg | ORAL_TABLET | Freq: Four times a day (QID) | ORAL | Status: DC | PRN

## 2022-09-23 MED ORDER — ACETAMINOPHEN 325 MG PO TABS
650.0000 mg | ORAL_TABLET | Freq: Four times a day (QID) | ORAL | Status: DC | PRN
  Administered 2022-09-23 – 2022-09-27 (×3): 650 mg via ORAL
  Filled 2022-09-23 (×3): qty 2

## 2022-09-23 MED ORDER — ONDANSETRON HCL 4 MG/2ML IJ SOLN
4.0000 mg | Freq: Four times a day (QID) | INTRAMUSCULAR | Status: DC | PRN
  Administered 2022-09-23 – 2022-09-25 (×5): 4 mg via INTRAVENOUS
  Filled 2022-09-23 (×5): qty 2

## 2022-09-23 MED ORDER — ONDANSETRON HCL 4 MG PO TABS: 4.0000 mg | ORAL_TABLET | Freq: Four times a day (QID) | ORAL | Status: DC | PRN

## 2022-09-23 MED ORDER — MORPHINE SULFATE (PF) 2 MG/ML IV SOLN: 2.0000 mg | INTRAVENOUS | Status: AC | PRN

## 2022-09-23 MED ORDER — HYDRALAZINE HCL 20 MG/ML IJ SOLN: 5.0000 mg | Freq: Three times a day (TID) | INTRAMUSCULAR | Status: AC | PRN

## 2022-09-23 MED ORDER — MELATONIN 5 MG PO TABS
5.0000 mg | ORAL_TABLET | Freq: Every evening | ORAL | Status: DC | PRN
  Administered 2022-09-23: 5 mg via ORAL
  Filled 2022-09-23: qty 1

## 2022-09-23 MED ORDER — METRONIDAZOLE 500 MG/100ML IV SOLN
500.0000 mg | Freq: Two times a day (BID) | INTRAVENOUS | Status: DC
  Administered 2022-09-23 – 2022-09-26 (×6): 500 mg via INTRAVENOUS
  Filled 2022-09-23 (×6): qty 100

## 2022-09-23 MED ORDER — ONDANSETRON HCL 4 MG/2ML IJ SOLN
4.0000 mg | Freq: Once | INTRAMUSCULAR | Status: AC
  Administered 2022-09-23: 4 mg via INTRAVENOUS
  Filled 2022-09-23: qty 2

## 2022-09-23 MED ORDER — ACETAMINOPHEN 650 MG RE SUPP: 650.0000 mg | Freq: Four times a day (QID) | RECTAL | Status: DC | PRN

## 2022-09-23 MED ORDER — SODIUM CHLORIDE 0.9 % IV SOLN
2.0000 g | Freq: Once | INTRAVENOUS | Status: AC
  Administered 2022-09-23: 2 g via INTRAVENOUS
  Filled 2022-09-23: qty 12.5

## 2022-09-23 MED ORDER — MORPHINE SULFATE (PF) 4 MG/ML IV SOLN
4.0000 mg | INTRAVENOUS | Status: AC | PRN
  Administered 2022-09-23 – 2022-09-24 (×2): 4 mg via INTRAVENOUS
  Filled 2022-09-23 (×2): qty 1

## 2022-09-23 MED ORDER — IOHEXOL 300 MG/ML  SOLN
100.0000 mL | Freq: Once | INTRAMUSCULAR | Status: AC | PRN
  Administered 2022-09-23: 100 mL via INTRAVENOUS

## 2022-09-23 MED ORDER — MORPHINE SULFATE (PF) 4 MG/ML IV SOLN
4.0000 mg | Freq: Once | INTRAVENOUS | Status: AC
  Administered 2022-09-23: 4 mg via INTRAVENOUS
  Filled 2022-09-23: qty 1

## 2022-09-23 MED ORDER — METRONIDAZOLE 500 MG/100ML IV SOLN
500.0000 mg | Freq: Two times a day (BID) | INTRAVENOUS | Status: DC
  Filled 2022-09-23: qty 100

## 2022-09-23 MED ORDER — METRONIDAZOLE 500 MG/100ML IV SOLN
500.0000 mg | Freq: Once | INTRAVENOUS | Status: AC
  Administered 2022-09-23: 500 mg via INTRAVENOUS
  Filled 2022-09-23: qty 100

## 2022-09-23 MED ORDER — SODIUM CHLORIDE 0.9 % IV SOLN
2.0000 g | Freq: Two times a day (BID) | INTRAVENOUS | Status: DC
  Administered 2022-09-23 – 2022-09-25 (×5): 2 g via INTRAVENOUS
  Filled 2022-09-23 (×6): qty 12.5

## 2022-09-23 MED ORDER — LIDOCAINE HCL (PF) 1 % IJ SOLN
10.0000 mL | Freq: Once | INTRAMUSCULAR | Status: AC
  Administered 2022-09-23: 10 mL via INTRADERMAL

## 2022-09-23 MED ORDER — GADOBUTROL 1 MMOL/ML IV SOLN
6.0000 mL | Freq: Once | INTRAVENOUS | Status: AC | PRN
  Administered 2022-09-23: 6 mL via INTRAVENOUS

## 2022-09-23 MED ORDER — FOLIC ACID 1 MG PO TABS
1.0000 mg | ORAL_TABLET | Freq: Every day | ORAL | Status: DC
  Administered 2022-09-23 – 2022-10-04 (×11): 1 mg via ORAL
  Filled 2022-09-23 (×11): qty 1

## 2022-09-23 MED ORDER — LACTATED RINGERS IV BOLUS (SEPSIS)
500.0000 mL | Freq: Once | INTRAVENOUS | Status: AC
  Administered 2022-09-23: 500 mL via INTRAVENOUS

## 2022-09-23 MED ORDER — METRONIDAZOLE 500 MG/100ML IV SOLN: 500.0000 mg | Freq: Two times a day (BID) | INTRAVENOUS | Status: DC

## 2022-09-23 MED ORDER — SENNOSIDES-DOCUSATE SODIUM 8.6-50 MG PO TABS: 2.0000 | ORAL_TABLET | Freq: Two times a day (BID) | ORAL | Status: DC | PRN

## 2022-09-23 MED ORDER — LACTATED RINGERS IV SOLN: INTRAVENOUS | Status: AC

## 2022-09-23 MED ORDER — VANCOMYCIN HCL IN DEXTROSE 1-5 GM/200ML-% IV SOLN
1000.0000 mg | Freq: Once | INTRAVENOUS | Status: AC
  Administered 2022-09-23: 1000 mg via INTRAVENOUS
  Filled 2022-09-23: qty 200

## 2022-09-23 MED ORDER — FENTANYL CITRATE PF 50 MCG/ML IJ SOSY: 12.5000 ug | PREFILLED_SYRINGE | INTRAMUSCULAR | Status: AC | PRN

## 2022-09-23 MED ORDER — SENNOSIDES-DOCUSATE SODIUM 8.6-50 MG PO TABS: 1.0000 | ORAL_TABLET | Freq: Every evening | ORAL | Status: DC | PRN

## 2022-09-23 MED ORDER — LOSARTAN POTASSIUM 50 MG PO TABS
100.0000 mg | ORAL_TABLET | Freq: Every day | ORAL | Status: DC
  Administered 2022-09-23 – 2022-10-04 (×12): 100 mg via ORAL
  Filled 2022-09-23 (×12): qty 2

## 2022-09-23 NOTE — Assessment & Plan Note (Addendum)
Status post drain placement by interventional radiology on 7/30.  MRSA infection.  Case discussed with IR and a repeat CT scan was done today that showed improvement of the abscesses.  IR team to determine what to do with the drains today.

## 2022-09-23 NOTE — Assessment & Plan Note (Signed)
Continue Crestor and Zetia 

## 2022-09-23 NOTE — Assessment & Plan Note (Addendum)
MRSA infection.  Present on admission with epidural abscess and left psoas muscle abscess, leukocytosis and tachycardia.  Status post drain placements by interventional radiology and neurosurgery.  Continue vancomycin

## 2022-09-23 NOTE — H&P (Addendum)
History and Physical   Christy Ray MVH:[id number] DOB: 10/25/1955 DOA: 09/23/2022  PCP: Ethelda Chick, MD  Outpatient Specialists: Dr. Deno Etienne [facility] rheumatology Patient coming from: Home via EMS  I have personally briefly reviewed patient's old medical records in [facility] EMR.  Chief Concern: Back pain started two weeks ago  HPI: Christy Ray is a 67 year old female with history of dermatomyositis, hypertension, hyperlipidemia, who presents to the emergency department for chief concerns of low back pain.  Vitals in the emergency department showed temperature 98, respiration rate of 18, heart rate 94, blood pressure 142/90, SpO2 of 95% on room air.  Serum sodium is 136, potassium 3.7, chloride of 102, bicarb 26, BUN of 17, serum creatinine of 0.58, nonfasting blood glucose 115, EGFR greater than 60.  WBC elevated at 23.4, hemoglobin 12.2, platelets of 389.  Lactic acid was 1.0.  Sed rate elevated at 60.  Blood cultures x 2 are in process. c-reactive protein is in process.  CT abdomen pelvis with contrast: Was read as dorsal epidural abscess measuring 5.0 x 1.0 x 1.3 cm with associated bony erosion of the left L3-L4 facet joint suggestive of septic arthritis.  Bilateral paraspinal and left psoas abscesses.  EDP consulted with neurosurgery and infectious disease specialist.  Neurosurgery discussed with interventional radiologist, patient to be taken to IR for epidural abscess drain.  MRI of the lumbar spine with and without contrast, MRI of the thoracic spine with and without contrast has been ordered.  IR ultrasound abscess drain placement ordered.  ED treatment: Morphine 4 mg IV one-time dose, ondansetron 4 mg IV one-time dose, LR 150 mL/h, vancomycin, cefepime have been ordered. ------------------------------- At bedside, patient was able to tell me her name, age, location of hospital.  She does not appear to be in acute distress.  She reports that 2 weeks ago  she developed back pain and thought she had sprained her muscles.  She denies fever, chills.  She states that last year she had a MRSA infection from an abscess as well and she did not develop a fever last year either.  She denies trauma to her person.  Over the last 2 weeks she has been progressively worsening and had presented to [facility] urgent center twice.  She denies nausea, vomiting, chest pain, shortness of breath, dysuria, hematuria, diarrhea, swelling of his lower extremities.  She endorses a generalized headache.  Social history: She lives at home with her husband.  She is a former tobacco user.  She denies EtOH and recreational drug use.  She is retired.  ROS: Constitutional: no weight change, no fever ENT/Mouth: no sore throat, no rhinorrhea Eyes: no eye pain, no vision changes Cardiovascular: no chest pain, no dyspnea,  no edema, no palpitations Respiratory: no cough, no sputum, no wheezing Gastrointestinal: no nausea, no vomiting, no diarrhea, no constipation Genitourinary: no urinary incontinence, no dysuria, no hematuria Musculoskeletal: no arthralgias, no myalgias, + back pain Skin: no skin lesions, no pruritus, Neuro: + weakness, no loss of consciousness, no syncope Psych: no anxiety, no depression, no decrease appetite Heme/Lymph: no bruising, no bleeding  ED Course: Discussed with emergency medicine provider, patient requiring hospitalization for chief concerns of suspected severe abscess in setting of epidural abscess, paraspinal abscess, and left psoas abscess.  Assessment/Plan  Principal Problem:   Epidural abscess Active Problems:   Abscess in epidural space of thoracic spine   Psoas muscle abscess (HCC)   Sepsis (HCC)   Dermatomyositis (HCC)   Gout  Essential hypertension   Hyperlipidemia   Headache   Assessment and Plan:  * Epidural abscess Dr. Marcell Barlow, neurosurgery has been consulted by EDP, appreciate further guidance and  recommendations Ultrasound-guided fluid drained by catheter placed MRI of the thoracic and lumbar with and without contrast material placed Infectious disease specialist, Dr. Rivka Safer has been made aware, appreciate further guidance and recommendations Sed rate elevated on day of admission, sed rate check daily, 2 days ordered  Sepsis (HCC) Bilateral paraspinal abscess, epidural abscess, left psoas abscess Blood cultures x 2 are in process Body fluid culture with Gram stain placed s/p collection Anaerobic/aerobic culture with Gram stain placed pending collection Continue with metronidazole 500 mg IV twice daily, cefepime and vancomycin per pharmacy LR 500 mL bolus Continue LR at 150 mL/h, 20 hours ordered AM labs: CBC, BMP, sed rate to monitor antibiotic effectiveness Admit to telemetry medical, inpatient  Psoas muscle abscess (HCC) With communication to the paraspinal abscess Status post paraspinal drain placement Per interventional radiology: Recommend a.m. team to obtain a CT with contrast on Monday or Tuesday (7/29 or 7/30) to evaluate progress of abscess decompression.  Hyperlipidemia Rosuvastatin 10 mg nightly resumed  Essential hypertension Mildly elevated on admission as patient had not taken her home medication on day of presentation Home amlodipine 5 mg daily, losartan 100 mg daily resumed Hydralazine 5 mg IV every 8 hours as needed for SBP greater 175, 4 days ordered  Headache Acetaminophen 650 mg p.o./rectal every 6 hours as needed for mild pain, headache, fever ordered Meningitis cannot be excluded at this time however given the patient just had epidural abscess drain placed, we will avoid LP at this time for risk of contamination Continue vancomycin and cefepime per pharmacy, dosing does not change with possible meningitis  Chart reviewed.   09/02/2021:  DVT prophylaxis: TED hose; patient to procedure on day of admission, AM team to initiate pharmacologic DVT  prophylaxis when the benefits outweigh the risk Code Status: Full code Diet: N.p.o. Family Communication: Patient states that her husband know she is being admitted to the hospital. Disposition Plan: Pending clinical course, neurosurgery recommendation and ID recommendation Consults called: Neurosurgery, infectious disease specialist Admission status: Telemetry medical, inpatient  Past Medical History:  Diagnosis Date   Dermatomyositis (HCC)    MRSA (methicillin resistant Staphylococcus aureus)    Polyarthritis    Past Surgical History:  Procedure Laterality Date   INCISION AND DRAINAGE OF WOUND Left 07/14/2021   Procedure: IRRIGATION AND DEBRIDEMENT WOUND;  Surgeon: Rosetta Posner, DPM;  Location: [facility] ORS;  Service: Podiatry;  Laterality: Left;   IR RADIOLOGIST EVAL & MGMT  07/29/2021   LUMBAR LAMINECTOMY FOR EPIDURAL ABSCESS N/A 07/16/2021   Procedure: LUMBAR LAMINECTOMY FOR EPIDURAL ABSCESS;  Surgeon: Venetia Night, MD;  Location: [facility] ORS;  Service: Neurosurgery;  Laterality: N/A;   OSTECTOMY Left 07/14/2021   Procedure: JOINT RESECTION;  Surgeon: Rosetta Posner, DPM;  Location: [facility] ORS;  Service: Podiatry;  Laterality: Left;   Social History:  reports that she quit smoking about 14 months ago. Her smoking use included cigarettes. She has never used smokeless tobacco. She reports that she does not drink alcohol and does not use drugs.  Allergies  Allergen Reactions   Sulfa Antibiotics Rash   Family History  Problem Relation Age of Onset   Breast cancer Mother    Hypertension Father    Alzheimer's disease Father    Gout Father    Family history: Family history reviewed and not pertinent.  Prior to  Admission medications   Medication Sig Start Date End Date Taking? Authorizing Provider  acyclovir (ZOVIRAX) 400 MG tablet Take 400 mg by mouth 3 (three) times daily.  TAKE 1 TABLET (400 MG TOTAL) BY MOUTH 3 (THREE) TIMES DAILY FOR 5 DAYS AS NEEDED FOR OUTBREAK 03/24/22  Yes  [provider]  amLODipine (NORVASC) 5 MG tablet Take 1 tablet by mouth daily. 08/05/22  Yes [provider]  augmented betamethasone dipropionate (DIPROLENE-AF) 0.05 % cream Apply 1 application  topically daily as needed. 08/26/22  Yes [provider]  meloxicam (MOBIC) 15 MG tablet Take 15 mg by mouth daily. 09/19/22  Yes [provider]  methylPREDNISolone (MEDROL DOSEPAK) 4 MG TBPK tablet Take by mouth as directed. 09/19/22  Yes [provider]  tiZANidine (ZANAFLEX) 2 MG tablet Take 2 mg by mouth 2 (two) times daily as needed. 09/09/22  Yes [provider]  amLODipine (NORVASC) 2.5 MG tablet Take 2.5 mg by mouth daily. 06/07/21   [provider]  folic acid (FOLVITE) 1 MG tablet Take 1 mg by mouth daily. 05/28/21   [provider]  hydroxychloroquine (PLAQUENIL) 200 MG tablet Take 200 mg by mouth daily.    [provider]  hydrOXYzine (ATARAX) 10 MG tablet Take 10 mg by mouth at bedtime. 05/17/21   [provider]  losartan (COZAAR) 100 MG tablet Take 1 tablet (100 mg total) by mouth daily. 07/21/21   Marrion Coy, MD  rosuvastatin (CRESTOR) 10 MG tablet Take 10 mg by mouth daily. 05/11/21   [provider]  senna-docusate (SENOKOT-S) 8.6-50 MG tablet Take 2 tablets by mouth 2 (two) times daily as needed for mild constipation. 07/20/21   Marrion Coy, MD   Physical Exam: Vitals:   09/23/22 1030 09/23/22 1421 09/23/22 1440 09/23/22 1547  BP: 129/82 126/72 (P) 131/82 (!) 146/80  Pulse: 86 92 (P) 94 83  Resp:  18 (P) 18 18  Temp:    98.1 F (36.7 C)  TempSrc:    Oral  SpO2: 97% 97% (P) 99% 99%  Weight:      Height:       Constitutional: appears age-appropriate, frail, cachectic appearing, NAD Eyes: PERRL, lids and conjunctivae normal ENMT: Mucous membranes are moist. Posterior pharynx clear of any exudate or lesions. Age-appropriate dentition. Hearing appropriate Neck: normal, supple, no masses, no  thyromegaly Respiratory: clear to auscultation bilaterally, no wheezing, no crackles. Normal respiratory effort. No accessory muscle use.  Cardiovascular: Regular rate and rhythm, no murmurs / rubs / gallops. No extremity edema. 2+ pedal pulses. No carotid bruits.  Abdomen: no tenderness, no masses palpated, no hepatosplenomegaly. Bowel sounds positive.  Musculoskeletal: no clubbing / cyanosis. No joint deformity upper and lower extremities. Good ROM, no contractures, no atrophy. Normal muscle tone.  JP drain in place with approximately 5 mL of light green purulent drainage Skin: no rashes, lesions, ulcers. No induration Neurologic: Sensation intact. Strength 5/5 in all 4.  Psychiatric: Normal judgment and insight. Alert and oriented x 3. Normal mood.   EKG: Not indicated at this time  Chest x-ray on Admission: Not indicated at this time  [place] Abscess Drain  Result Date: 09/23/2022 INDICATION: Multi focal paraspinal abscesses EXAM: 1. Ultrasound-guided aspiration of right paraspinal abscess 2. Ultrasound-guided drain placement into left paraspinal abscess MEDICATIONS: Documented in the EMR ANESTHESIA/SEDATION: Local analgesia COMPLICATIONS: None immediate. PROCEDURE: Informed written consent was obtained from the patient after a thorough discussion of the procedural risks, benefits and alternatives. All questions were addressed.  Maximal Sterile Barrier Technique was utilized including caps, mask, sterile gowns, sterile gloves, sterile drape, hand hygiene and skin antiseptic. A timeout was performed prior to the initiation of the procedure. Patient was placed prone on the exam table. Ultrasound was used to evaluate the subcutaneous tissues of the lower back, identifying both the right and left paraspinal collections as seen on both recent CT and MRI imaging. Plan was made to proceed with right paraspinal fluid aspiration and left paraspinal collection drain placement. Skin entry sites were marked, and  the overlying skin was prepped and draped in the standard sterile fashion. Local analgesia was obtained with 1% lidocaine. Attention was first turned to the right paraspinal collection, which was relatively smaller in size. Under ultrasound guidance, a 19 gauge Yueh catheter was advanced directly into this collection. Subsequently, approximately 40 mL of thick green purulent material was aspirated. Postprocedure imaging demonstrated near-complete decompression of this abscess. Needle was withdrawn, and a clean dressing was placed. Attention was then turned to the left paraspinal collection, the larger of the collections. Imaging demonstrated a small channel communication between both this more superficial collection in the deeper left psoas abscess. Given this, the decision was made to attempt decompression of both the paraspinal collection in the left psoas collection via a single drain placed in the more superficial component. Under ultrasound guidance, a 12 [place] multipurpose drainage catheter was advanced into the left paraspinal collection using trocar technique. Location was confirmed with both ultrasound and return of additional thick green purulent material. Locking loop was formed, and the drainage catheter was secured to the skin using silk suture and a dressing. An additional 60 mL of thick green purulent material was aspirated. It was then attached to bulb suction. The patient tolerated all aspects of the procedure well without immediate complication. IMPRESSION: 1. Successful ultrasound-guided aspiration of right paraspinal abscess yielding 40 mL of thick green purulent material. 2. Successful ultrasound-guided placement of a 12 French locking drainage catheter into the left paraspinal collection yielding an additional 60 mL of thick green purulent material. Drainage catheter placed to bulb suction. 3. Recommend flushing of percutaneous drainage catheter x3 daily with 10 mL normal saline. 4. Given  thick nature of the abscess, the deeper left psoas collection may not adequately decompress via the left paraspinal drainage catheter despite the presence of a communication between the 2 collections. Recommend close CT follow-up in 3-5 days to evaluate for interval change. If the collection remains unchanged in size, this may warrant additional CT-guided placement of a drainage catheter into the left psoas collection. Electronically Signed   By: Olive Bass M.D.   On: 09/23/2022 16:05   [place] IMAGE GUIDED FLUID DRAIN BY CATHETER  Result Date: 09/23/2022 INDICATION: Multi focal paraspinal abscesses EXAM: 1. Ultrasound-guided aspiration of right paraspinal abscess 2. Ultrasound-guided drain placement into left paraspinal abscess MEDICATIONS: Documented in the EMR ANESTHESIA/SEDATION: Local analgesia COMPLICATIONS: None immediate. PROCEDURE: Informed written consent was obtained from the patient after a thorough discussion of the procedural risks, benefits and alternatives. All questions were addressed. Maximal Sterile Barrier Technique was utilized including caps, mask, sterile gowns, sterile gloves, sterile drape, hand hygiene and skin antiseptic. A timeout was performed prior to the initiation of the procedure. Patient was placed prone on the exam table. Ultrasound was used to evaluate the subcutaneous tissues of the lower back, identifying both the right and left paraspinal collections as seen on both recent CT and MRI imaging. Plan was made to proceed with right paraspinal fluid  aspiration and left paraspinal collection drain placement. Skin entry sites were marked, and the overlying skin was prepped and draped in the standard sterile fashion. Local analgesia was obtained with 1% lidocaine. Attention was first turned to the right paraspinal collection, which was relatively smaller in size. Under ultrasound guidance, a 19 gauge Yueh catheter was advanced directly into this collection. Subsequently,  approximately 40 mL of thick green purulent material was aspirated. Postprocedure imaging demonstrated near-complete decompression of this abscess. Needle was withdrawn, and a clean dressing was placed. Attention was then turned to the left paraspinal collection, the larger of the collections. Imaging demonstrated a small channel communication between both this more superficial collection in the deeper left psoas abscess. Given this, the decision was made to attempt decompression of both the paraspinal collection in the left psoas collection via a single drain placed in the more superficial component. Under ultrasound guidance, a 12 [place] multipurpose drainage catheter was advanced into the left paraspinal collection using trocar technique. Location was confirmed with both ultrasound and return of additional thick green purulent material. Locking loop was formed, and the drainage catheter was secured to the skin using silk suture and a dressing. An additional 60 mL of thick green purulent material was aspirated. It was then attached to bulb suction. The patient tolerated all aspects of the procedure well without immediate complication. IMPRESSION: 1. Successful ultrasound-guided aspiration of right paraspinal abscess yielding 40 mL of thick green purulent material. 2. Successful ultrasound-guided placement of a 12 French locking drainage catheter into the left paraspinal collection yielding an additional 60 mL of thick green purulent material. Drainage catheter placed to bulb suction. 3. Recommend flushing of percutaneous drainage catheter x3 daily with 10 mL normal saline. 4. Given thick nature of the abscess, the deeper left psoas collection may not adequately decompress via the left paraspinal drainage catheter despite the presence of a communication between the 2 collections. Recommend close CT follow-up in 3-5 days to evaluate for interval change. If the collection remains unchanged in size, this may warrant  additional CT-guided placement of a drainage catheter into the left psoas collection. Electronically Signed   By: Olive Bass M.D.   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 CONTRAST:  6mL GADAVIST GADOBUTROL 1 MMOL/ML IV SOLN COMPARISON:  MRI 07/14/2021, CT 09/23/2022 FINDINGS: Segmentation:  Standard. Alignment:  Grade 1 anterolisthesis of L3 on L4 and L4 on L5. Vertebrae: Subchondral bone marrow edema associated with the left L3-L4 and L4-L5 facet joints and mild bone marrow edema within the left L3 and L4 pedicles. Subtle erosive changes noted along the posterior margins of the left L3-4 and left L4-5 facet joints. 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 Lumbar vertebral body heights are maintained without fracture. No evidence of discitis. No marrow replacing bone lesion. Conus medullaris and cauda equina: Conus extends to the L1-2 level. Conus appears normal. There is compression of the cauda equina nerve roots at the L3-4 level secondary to space-occupying epidural abscess. Large epidural abscess within the posterior epidural space spanning from the L3 level to the L5-S1 level measures 2.1 x 1.0 cm in maximal transverse dimension which is at the L4 vertebral  body level (series 19, image 23) and spans a craniocaudal length of approximately 8.0 cm (series 21, image 9). This has increased in size compared to the previous MRI. Paraspinal and other soft tissues: Large posterior paraspinal intramuscular abscesses within the lumbar region measuring 8.3 x 3.2 x 4.3 cm on the right and 12.0 x 4.3 x 4.8 cm on the left. Left psoas muscle abscess  measuring 7.5 x 2.8 x 2.9 cm. Paraspinal muscle abscesses have increased since 2023. Disc levels: T12-L1: Mild disc bulge and facet hypertrophy. No foraminal or canal stenosis. L1-L2: Mild disc bulge and facet hypertrophy. No foraminal or canal stenosis. L2-L3: Minimal disc bulge and mild-moderate facet hypertrophy. No foraminal or canal stenosis. L3-L4: Disc bulge with bilateral facet arthropathy and posterior epidural abscess. Severe canal stenosis with severe left and mild right foraminal stenosis. The epidural abscess fills the left neural foramen at this level. L4-L5: Disc bulge with severe bilateral facet arthropathy and posterior epidural abscess. Moderate canal stenosis with right greater than left subarticular recess stenosis. Mild-to-moderate bilateral foraminal stenosis. L5-S1: No disc bulge. Bilateral facet arthropathy. No foraminal or canal stenosis. IMPRESSION: 1. 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 2. Large posterior epidural abscess spanning from the L3 level to the L5-S1 level measuring up to 2.1 x 1.0 cm transaxially and spanning 8.0 cm in length. This has increased in size compared to the previous MRI. There is associated severe canal stenosis at the L3-4 disc level and L4 vertebral body level. 3. Severe left foraminal stenosis at L3-4. The epidural abscess fills the left neural foramen at this level. 4. Large posterior paraspinal intramuscular abscesses within the lumbar region measuring 8.3 x 3.2 x 4.3 cm on the right and 12.0 x 4.3 x 4.8 cm on the left. Left psoas muscle abscess measuring 7.5 x 2.8 x 2.9 cm. Paraspinal muscle abscesses have increased in size since 2023. Electronically Signed   By: Duanne Guess D.O.   On: 09/23/2022 15:08   MR THORACIC SPINE W WO CONTRAST  Result Date: 09/23/2022 CLINICAL DATA:  Mid-back pain h/o of thoracic and lumbar epidural abscess EXAM: MRI THORACIC WITHOUT AND WITH  CONTRAST TECHNIQUE: Multiplanar and multiecho pulse sequences of the thoracic spine were obtained without and with intravenous contrast. CONTRAST:  6mL GADAVIST GADOBUTROL 1 MMOL/ML IV SOLN COMPARISON:  MRI 07/14/2021 FINDINGS: Alignment:  Physiologic. Vertebrae: No fracture, evidence of discitis, or bone lesion. Cord: Normal signal and morphology. Previously seen mid to lower thoracic epidural fluid collection has resolved. No residual collection or abnormal postcontrast enhancement within the thoracic canal. Paraspinal and other soft tissues: Negative. Disc levels: T1-T2: Tiny disc protrusion. Mild facet hypertrophy. No foraminal or canal stenosis. T2-T3 through T5-T6: Mild facet hypertrophy. No significant canal or neural foraminal narrowing of these levels. T6-T7: Mild disc bulge and endplate spurring on the left. No foraminal or canal stenosis. T7-T8: Mild endplate spurring without foraminal or canal stenosis. T8-T9: Minimal disc bulge and endplate spurring with mild facet hypertrophy. No foraminal or canal stenosis. T9-T10: Bilateral facet hypertrophy without foraminal or canal stenosis. T10-T11: Mild disc bulge and endplate spurring with mild facet hypertrophy. No canal stenosis. Mild left foraminal stenosis. T11-T12: Mild disc bulge and endplate spurring with bilateral facet hypertrophy. No foraminal or canal stenosis. IMPRESSION: 1. Previously seen mid to lower thoracic epidural fluid collection has resolved. No residual collection or abnormal postcontrast enhancement within the thoracic canal. 2. Mild multilevel thoracic spondylosis. Mild left  foraminal stenosis at T10-T11. No canal stenosis at any level. Electronically Signed   By: Duanne Guess D.O.   On: 09/23/2022 14:54   CT ABDOMEN PELVIS W CONTRAST  Result Date: 09/23/2022 CLINICAL DATA:  groin pain, back pain. history of psoas abscess and paraspinal muscle abscess. EXAM: CT ABDOMEN AND PELVIS WITH CONTRAST TECHNIQUE: Multidetector CT imaging  of the abdomen and pelvis was performed using the standard protocol following bolus administration of intravenous contrast. RADIATION DOSE REDUCTION: This exam was performed according to the departmental dose-optimization program which includes automated exposure control, adjustment of the mA and/or kV according to patient size and/or use of iterative reconstruction technique. CONTRAST:  OMNIPAQUE IOHEXOL 300 MG/ML  SOLN COMPARISON:  CT abdomen/pelvis 07/29/2021. FINDINGS: Lower chest: Small pericardial effusion. Within the anterior mediastinum, there is a partially imaged, peripherally enhancing lesion, possibly representing an additional site of abscess. Hepatobiliary: No focal liver abnormality is seen. No gallstones, gallbladder wall thickening, or biliary dilatation. Pancreas: Unremarkable. No pancreatic ductal dilatation or surrounding inflammatory changes. Spleen: Normal in size without focal abnormality. Adrenals/Urinary Tract: Adrenal glands are unremarkable. Kidneys are normal, without renal calculi, focal lesion, or hydronephrosis. Bladder is unremarkable. Stomach/Bowel: Stomach is within normal limits. Normal appendix is visualized on coronal image 33 series 5. No evidence of bowel wall thickening, distention, or inflammatory changes. Vascular/Lymphatic: Aortic atherosclerosis. No enlarged abdominal or pelvic lymph nodes. Reproductive: Uterus and bilateral adnexa are unremarkable. Other: No abdominal wall hernia or abnormality. No abdominopelvic ascites. Musculoskeletal: Peripherally enhancing, dorsal epidural fluid collection, measuring up to 5.0 x 1.0 x 1.3 cm (axial image 35 series 2, sagittal image 57 series 6), consistent with epidural abscess. Associated bony erosion of the left L3-4 facet joint (axial image 36 series 2), suggestive of septic arthritis. Associated large bilateral paraspinal and left psoas abscesses. The left paraspinal abscess measures up to 11.3 x 4.5 cm in the sagittal  plane (image 67 series 6). The right paraspinal abscess measures up to 8.5 x 3.7 cm in the sagittal plane (image 52 series 6). The left psoas abscess measures up to 6.4 x 2.8 cm in the sagittal plane (image 69 series 6). IMPRESSION: 1. Dorsal epidural abscess, measuring up to 5.0 x 1.0 x 1.3 cm. Associated bony erosion of the left L3-4 facet joint, suggestive of septic arthritis. 2. Associated large bilateral paraspinal and left psoas abscesses, as described above. 3. Partially imaged, peripherally enhancing lesion in the anterior mediastinum, possibly representing an additional site of abscess. Chest CT is recommended for further evaluation. 4. Small pericardial effusion. Aortic Atherosclerosis (ICD10-I70.0). Critical Value/emergent results were called by telephone at the time of interpretation on 09/23/2022 at 11:55 am to provider [facility] , who verbally acknowledged these results. Electronically Signed   By: Orvan Falconer M.D.   On: 09/23/2022 12:09    Labs on Admission: I have personally reviewed following labs  CBC: Recent Labs  Lab 09/23/22 1038  WBC 23.4*  NEUTROABS 20.3*  HGB 12.2  HCT 36.8  MCV 81.6  PLT 389   Basic Metabolic Panel: Recent Labs  Lab 09/23/22 1038  NA 136  K 3.7  CL 102  CO2 26  GLUCOSE 115*  BUN 17  CREATININE 0.58  CALCIUM 8.8*   GFR: Estimated Creatinine Clearance: 59.7 mL/min (by C-G formula based on SCr of 0.58 mg/dL).  Coagulation Profile: Recent Labs  Lab 09/23/22 1206  INR 1.2   Urine analysis:    Component Value Date/Time   COLORURINE STRAW (A) 09/02/2021 1332  APPEARANCEUR CLEAR (A) 09/02/2021 1332   LABSPEC 1.006 09/02/2021 1332   PHURINE 6.0 09/02/2021 1332   GLUCOSEU NEGATIVE 09/02/2021 1332   HGBUR NEGATIVE 09/02/2021 1332   BILIRUBINUR NEGATIVE 09/02/2021 1332   KETONESUR NEGATIVE 09/02/2021 1332   PROTEINUR NEGATIVE 09/02/2021 1332   NITRITE NEGATIVE 09/02/2021 1332   LEUKOCYTESUR NEGATIVE 09/02/2021 1332   This  document was prepared using Dragon Voice Recognition software and may include unintentional dictation errors.  Dr. Sedalia Muta [facility]  If 7PM-7AM, please contact overnight-coverage provider If 7AM-7PM, please contact day attending provider [email or web address]  09/23/2022, 4:23 PM

## 2022-09-23 NOTE — ED Notes (Signed)
First Nurse Note: Pt to ED via [facility] from home for back pain. Started 2 weeks ago and has continued to get worse. Pt able to stand and Pivot with minimal assistance. Pt is also having groin pain. Pt is in NAD. VSS.

## 2022-09-23 NOTE — Assessment & Plan Note (Signed)
Acetaminophen 650 mg p.o./rectal every 6 hours as needed for mild pain, headache, fever ordered Meningitis cannot be excluded at this time however given the patient just had epidural abscess drain placed, we will avoid LP at this time for risk of contamination Continue vancomycin and cefepime per pharmacy, dosing does not change with possible meningitis

## 2022-09-23 NOTE — Progress Notes (Signed)
CODE SEPSIS - PHARMACY COMMUNICATION  **Broad Spectrum Antibiotics should be administered within 1 hour of Sepsis diagnosis**  Time Code Sepsis Called/Page Received: 1143  Antibiotics Ordered: cefepime and vancomycin  Time of 1st antibiotic administration: 1221  Additional action taken by pharmacy: none  If necessary, Name of Provider/Nurse Contacted: none    Rockwell Alexandria ,PharmD Clinical Pharmacist  09/23/2022  12:28 PM

## 2022-09-23 NOTE — Progress Notes (Signed)
Pharmacy Antibiotic Note  Christy Ray is a 67 y.o. female admitted on 09/23/2022 with sepsis.  Pharmacy has been consulted for cefepime and vancomycin dosing.  Patient presenting with a chief complaint of back pain persisting for several weeks. In ED, patient is afebrile with WBC of 23.4. CT of abdomen shows epidural abscess. IR is planning aspiration and drainage.   Plan: Give vancomycin 500 mg IV x1 to complete 1500 mg total load Start vancomycin 1000 mg IV q24 hours (eAUC 433, Scr 0.8, Vd 0.72 L/kg) Start cefepime 2g IV q12 hours based on current renal function F/u IR plans for I&D Monitor renal function, clinical status, culture data, and LOT  Height: 5\' 5"  (165.1 cm) Weight: 59.4 kg (130 lb 15.3 oz) IBW/kg (Calculated) : 57  Temp (24hrs), Avg:98 F (36.7 C), Min:98 F (36.7 C), Max:98 F (36.7 C)  Recent Labs  Lab 09/23/22 1038 09/23/22 1203  WBC 23.4*  --   CREATININE 0.58  --   LATICACIDVEN  --  1.0    Estimated Creatinine Clearance: 59.7 mL/min (by C-G formula based on SCr of 0.58 mg/dL).    Allergies  Allergen Reactions   Sulfa Antibiotics Rash    Antimicrobials this admission: cefepime 7/26 >>  vancomycin 7/26 >>  Metronidazole 7/26 x1  Dose adjustments this admission: N/A  Microbiology results: 7/26 BCx: pending  Thank you for involving pharmacy in this patient's care.   Rockwell Alexandria, PharmD Clinical Pharmacist 09/23/2022 1:13 PM\

## 2022-09-23 NOTE — Assessment & Plan Note (Addendum)
Continue Norvasc and Cozaar 

## 2022-09-23 NOTE — Hospital Course (Addendum)
[name] is a 67 year old female with history of dermatomyositis, hypertension, hyperlipidemia, who presents to the emergency department for chief concerns of low back pain. Patient had a disseminated MRSA infection in 2023, involving left great toe septic arthritis, left psoas abscess, multilevel epidural abscess.  She had a lumbar laminectomy, completed 6 weeks IV antibiotics followed by oral Bactrim.  Bactrim was discontinued due to allergy. Patient was doing well until recently when he developed significant back pain. She has significant cytosis of 23.4, CRP 11.9.  Sed rate 60.  CT scan showed dorsal epidural abscess measuring 5.0 x 1.0 x 1.3 cm with associated bony erosion of the left L3-L4 facet joint suggestive of septic arthritis. Bilateral paraspinal and left psoas abscesses.  MRI of the lumbar spine showed large posterior paraspinal intramuscular abscess on the right and on the left as well as the left psoas muscle abscess.  Compression of the cauda equina nerve roots At L3-L4 secondary to space-occupying epidural abscess. Left L3-L4 and L4-L5 facet joint bone marrow edema and subtle erosive changes noted in the posterior margin of the left L3-L4 and L3 left L4-L5 facet joints consistent with septic arthritis and osteomyelitis.   Patient is seen by neurosurgery, ID, IR performed right paraspinal abscess aspiration, left paraspinal abscess drain on 7/26.  Patient is treated with vancomycin and cefepime.  Repeated CT scan on 7/29 improved left paraspinal abscess, still present over right paraspinal abscess and epidural abscess.    7/30.  Left psoas muscle abscess drained on 7/30. 7/31.  Neurosurgery plans on doing a spinal procedure for epidural abscess on 8/1. 8/1.  Neurosurgical procedure done for epidural abscess wash out and drain placement. 8/2.  Feeling well after neurosurgical procedure yesterday.  Anxious to go home. 8/3.  Patient having a little back discomfort but able to  move around okay.

## 2022-09-23 NOTE — ED Triage Notes (Signed)
Pt here via [facility] with back pain x3 weeks. Pt has been taking steroids and muscle relaxers with no relief. Pt states pain started in the right lower back but now radiates to her left side. Pt is also having a separate left groin pain. Pt had MRSA in her back 1 year ago but states the pain feel similar.   129/90

## 2022-09-23 NOTE — Assessment & Plan Note (Addendum)
Neurosurgical procedure on 8/1 for epidural abscess removal and washout.  Continue vancomycin.  Spinal culture growing Staph aureus and sensitivities still pending.

## 2022-09-23 NOTE — Consult Note (Signed)
NAME: Christy Ray  DOB: 09-18-1955  MRN: [id number]  Date/Time: 09/23/2022 6:02 PM  REQUESTING PROVIDER: Dr. Myer Haff Subjective:  REASON FOR CONSULT: Spinal infection ? Christy Ray is a 67 y.o. 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 Bactrim which was stopped due to rash repeat blood culture done on 10/11/2022 was negative for MRSA and was doing fine until 3 weeks ago. As per patient she was trying to reach up to remove the dome of her bulb and to wash it when she felt a twinge and pain in the lower back on the right side.  A few days later she was playing with her grandkids on the ground when she had severe pain in the low back.  She went to [facility] urgent care center and was given muscle relaxant and prednisone. As the pain got worse and was radiating to both her groin area she came to the ED today.  She does not have any fever She is a current smoker She does not have any cough or shortness of breath She has had chronic nausea intermittently and has lost about 10 pounds of weight over the last many months.  She is being investigated by her PCP and there was a plan to do a CAT scan next week She is to take CellCept for dermatomyositis/polymyositis but has not been on it since her disseminated MRSA infection she had in May 2023.  Vitals in the ED BP of 142/90, temperature 98, pulse 94, respiratory rate 18 and sats of 95% Labs revealed a WBC of 23.4, hemoglobin 12.2, platelet 389 and creatinine of 0.58. Blood cultures were sent CT abdomen and pelvis revealed a peripherally enhancing dorsal epidural fluid collection measuring 5.1 and 1.3 cm consistent with epidural  abscess.  There was associated bony erosion of the left L3-L4 facet joint suggestive of septic arthritis.  Also there was a large bilateral paraspinal and left psoas abscess.  The left paraspinal abscess measured about 11.3 to 4.5 cm.  The right paraspinal abscess measured up to 8.5 to 3.7 cm.  The left psoas was 6.4 to 2.8 cm.  MRI of the thoracic spine the lumbar spine was done Thoracic spine revealed resolution of the epidural collection which were seen in 2023 MRI of the lumbar spine showed large posterior paraspinal intramuscular abscess on the right and on the left as well as the left psoas muscle abscess.  Compression of the cauda equina nerve roots At L3-L4 secondary to space-occupying epidural abscess. Left L3-L4 and L4-L5 facet joint bone marrow edema and subtle erosive changes noted in the posterior margin of the left L3-L4 and L3 left L4-L5 facet joints consistent with septic arthritis and osteomyelitis.  Intervention rate allergy did ultrasound-guided aspiration of the right paraspinal abscess and left drain in the left paraspinal abscess. I am asked to see the patient for the infection Patient is currently on vancomycin, cefepime and Flagyl Patient's weight since August 2023 she was doing very well until second week of July.  She has not had any fever or chills    Past Medical History:  Diagnosis Date   Dermatomyositis (HCC)    MRSA (methicillin resistant Staphylococcus aureus)    Polyarthritis  Past Surgical History:  Procedure Laterality Date   INCISION AND DRAINAGE OF WOUND Left 07/14/2021   Procedure: IRRIGATION AND DEBRIDEMENT WOUND;  Surgeon: Rosetta Posner, DPM;  Location: [facility] ORS;  Service: Podiatry;  Laterality: Left;   IR RADIOLOGIST EVAL & MGMT  07/29/2021   LUMBAR LAMINECTOMY FOR EPIDURAL ABSCESS N/A 07/16/2021   Procedure: LUMBAR LAMINECTOMY FOR EPIDURAL ABSCESS;  Surgeon: Venetia Night, MD;  Location: [facility] ORS;  Service: Neurosurgery;  Laterality: N/A;    OSTECTOMY Left 07/14/2021   Procedure: JOINT RESECTION;  Surgeon: Rosetta Posner, DPM;  Location: [facility] ORS;  Service: Podiatry;  Laterality: Left;    Social History   Socioeconomic History   Marital status: Married    Spouse name: Not on file   Number of children: Not on file   Years of education: Not on file   Highest education level: Not on file  Occupational History   Not on file  Tobacco Use   Smoking status: Former    Current packs/day: 0.00    Types: Cigarettes    Quit date: 07/14/2021    Years since quitting: 1.1   Smokeless tobacco: Never  Substance and Sexual Activity   Alcohol use: No   Drug use: Never   Sexual activity: Yes  Other Topics Concern   Not on file  Social History Narrative   Not on file   Social Determinants of Health   Financial Resource Strain: Low Risk  (02/10/2022)   Received from [facility]   Overall Financial Resource Strain (CARDIA)    Difficulty of Paying Living Expenses: Not hard at all  Food Insecurity: Patient Unable To Answer (09/23/2022)   Hunger Vital Sign    Worried About Running Out of Food in the Last Year: Patient unable to answer    Ran Out of Food in the Last Year: Patient unable to answer  Transportation Needs: Patient Declined (09/23/2022)   PRAPARE - Administrator, Civil Service (Medical): Patient declined    Lack of Transportation (Non-Medical): Patient declined  Physical Activity: Not on file  Stress: Not on file  Social Connections: Not on file  Intimate Partner Violence: Patient Declined (09/23/2022)   Humiliation, Afraid, Rape, and Kick questionnaire    Fear of Current or Ex-Partner: Patient declined    Emotionally Abused: Patient declined    Physically Abused: Patient declined    Sexually Abused: Patient declined    Family History  Problem Relation Age of Onset   Breast cancer Mother    Hypertension Father    Alzheimer's disease Father    Gout Father    Allergies  Allergen  Reactions   Sulfa Antibiotics Rash   I? Current Facility-Administered Medications  Medication Dose Route Frequency Provider Last Rate Last Admin   acetaminophen (TYLENOL) tablet 650 mg  650 mg Oral Q6H PRN Cox, Amy N, DO   650 mg at 09/23/22 1609   Or   acetaminophen (TYLENOL) suppository 650 mg  650 mg Rectal Q6H PRN Cox, Amy N, DO       amLODipine (NORVASC) tablet 5 mg  5 mg Oral Daily Cox, Amy N, DO   5 mg at 09/23/22 1609   [START ON 09/24/2022] ceFEPIme (MAXIPIME) 2 g in sodium chloride 0.9 % 100 mL IVPB  2 g Intravenous Q12H Cox, Amy N, DO       fentaNYL (SUBLIMAZE) injection 12.5 mcg  12.5 mcg Intravenous Q3H PRN Cox, Amy N, DO       folic acid (  FOLVITE) tablet 1 mg  1 mg Oral Daily Cox, Amy N, DO   1 mg at 09/23/22 1609   hydrALAZINE (APRESOLINE) injection 5 mg  5 mg Intravenous Q8H PRN Cox, Amy N, DO       lactated ringers infusion   Intravenous Continuous Cox, Amy N, DO 150 mL/hr at 09/23/22 1732 New Bag at 09/23/22 1732   losartan (COZAAR) tablet 100 mg  100 mg Oral Daily Cox, Amy N, DO   100 mg at 09/23/22 1609   melatonin tablet 5 mg  5 mg Oral QHS PRN Cox, Amy N, DO       [START ON 09/24/2022] metroNIDAZOLE (FLAGYL) IVPB 500 mg  500 mg Intravenous Q12H Cox, Amy N, DO       morphine (PF) 2 MG/ML injection 2 mg  2 mg Intravenous Q4H PRN Cox, Amy N, DO       morphine (PF) 4 MG/ML injection 4 mg  4 mg Intravenous Q4H PRN Cox, Amy N, DO       ondansetron (ZOFRAN) tablet 4 mg  4 mg Oral Q6H PRN Cox, Amy N, DO       Or   ondansetron (ZOFRAN) injection 4 mg  4 mg Intravenous Q6H PRN Cox, Amy N, DO       rosuvastatin (CRESTOR) tablet 10 mg  10 mg Oral QHS Cox, Amy N, DO       senna-docusate (Senokot-S) tablet 2 tablet  2 tablet Oral BID PRN Cox, Amy N, DO       [START ON 09/24/2022] vancomycin (VANCOCIN) IVPB 1000 mg/200 mL premix  1,000 mg Intravenous Q24H Cox, Amy N, DO         Abtx:  Anti-infectives (From admission, onward)    Start     Dose/Rate Route Frequency Ordered Stop    09/24/22 1230  vancomycin (VANCOCIN) IVPB 1000 mg/200 mL premix        1,000 mg 200 mL/hr over 60 Minutes Intravenous Every 24 hours 09/23/22 1321     09/24/22 0000  metroNIDAZOLE (FLAGYL) IVPB 500 mg        500 mg 100 mL/hr over 60 Minutes Intravenous Every 12 hours 09/23/22 1304 09/30/22 2359   09/24/22 0000  ceFEPIme (MAXIPIME) 2 g in sodium chloride 0.9 % 100 mL IVPB        2 g 200 mL/hr over 30 Minutes Intravenous Every 12 hours 09/23/22 1321     09/23/22 1330  vancomycin (VANCOREADY) IVPB 500 mg/100 mL        500 mg 100 mL/hr over 60 Minutes Intravenous  Once 09/23/22 1321 09/23/22 1730   09/23/22 1315  metroNIDAZOLE (FLAGYL) IVPB 500 mg  Status:  Discontinued        500 mg 100 mL/hr over 60 Minutes Intravenous Every 12 hours 09/23/22 1304 09/23/22 1304   09/23/22 1200  ceFEPIme (MAXIPIME) 2 g in sodium chloride 0.9 % 100 mL IVPB        2 g 200 mL/hr over 30 Minutes Intravenous  Once 09/23/22 1146 09/23/22 1234   09/23/22 1200  metroNIDAZOLE (FLAGYL) IVPB 500 mg        500 mg 100 mL/hr over 60 Minutes Intravenous  Once 09/23/22 1146 09/23/22 1328   09/23/22 1200  vancomycin (VANCOCIN) IVPB 1000 mg/200 mL premix        1,000 mg 200 mL/hr over 60 Minutes Intravenous  Once 09/23/22 1146 09/23/22 1633       REVIEW OF SYSTEMS:  Const: negative fever, negative  chills, positive weight loss Eyes: negative diplopia or visual changes, negative eye pain ENT: negative coryza, negative sore throat Resp: negative cough, hemoptysis, dyspnea Cards: negative for chest pain, palpitations, lower extremity edema GU: negative for frequency, dysuria and hematuria GI: Negative for abdominal pain, diarrhea, bleeding, constipation has nausea and vomiting and hence poor appetite Skin: negative for rash and pruritus Heme: negative for easy bruising and gum/nose bleeding MS: Back pain and bilateral groin pain  Neurolo: She had headache this afternoon  because of being empty stomach but that has  resolved completely, no dizziness, vertigo, memory problems No numbness in her legs No incontinence of bladder or bowel Psych: negative for feelings of anxiety, depression  Endocrine: negative for thyroid, diabetes Allergy/Immunology-sulfa  rash Objective:  VITALS:  BP (!) 146/80 (BP Location: Left Arm)   Pulse 83   Temp 98.1 F (36.7 C) (Oral)   Resp 18   Ht 5\' 5"  (1.651 m)   Wt 59.4 kg   SpO2 99%   BMI 21.79 kg/m  LDA Left paraspinal drain PHYSICAL EXAM:  General: Alert, cooperative, no distress, appears stated age.  Thin Head: Normocephalic, without obvious abnormality, atraumatic. Eyes: Conjunctivae clear, anicteric sclerae. Pupils are equal ENT Nares normal. No drainage or sinus tenderness. Lips, mucosa, and tongue normal. No Thrush Neck: Supple, symmetrical, no adenopathy, thyroid: non tender no carotid bruit and no JVD. Back: No CVA tenderness. Lungs: Clear to auscultation bilaterally. No Wheezing or Rhonchi. No rales. Heart: Regular rate and rhythm, no murmur, rub or gallop. Abdomen: Soft, non-tender,not distended. Bowel sounds normal. No masses Extremities: atraumatic, no cyanosis. No edema. No clubbing Skin: No rashes or lesions. Or bruising Lymph: Cervical, supraclavicular normal. Neurologic: Grossly non-focal Pertinent Labs Lab Results CBC    Component Value Date/Time   WBC 23.4 (H) 09/23/2022 1038   RBC 4.51 09/23/2022 1038   HGB 12.2 09/23/2022 1038   HCT 36.8 09/23/2022 1038   PLT 389 09/23/2022 1038   MCV 81.6 09/23/2022 1038   MCH 27.1 09/23/2022 1038   MCHC 33.2 09/23/2022 1038   RDW 14.7 09/23/2022 1038   LYMPHSABS 1.3 09/23/2022 1038   MONOABS 1.3 (H) 09/23/2022 1038   EOSABS 0.0 09/23/2022 1038   BASOSABS 0.1 09/23/2022 1038       Latest Ref Rng & Units 09/23/2022   10:38 AM 10/12/2021   10:07 AM 09/09/2021    9:05 AM  CMP  Glucose 70 - 99 mg/dL 034  70  90   BUN 8 - 23 mg/dL 17  16  26    Creatinine 0.44 - 1.00 mg/dL 7.42  5.95  6.38    Sodium 135 - 145 mmol/L 136  140  137   Potassium 3.5 - 5.1 mmol/L 3.7  3.5  4.0   Chloride 98 - 111 mmol/L 102  106  105   CO2 22 - 32 mmol/L 26  26  24    Calcium 8.9 - 10.3 mg/dL 8.8  9.1  9.0   Total Protein 6.5 - 8.1 g/dL  7.7  7.1   Total Bilirubin 0.3 - 1.2 mg/dL  0.5  0.2   Alkaline Phos 38 - 126 U/L  301  173   AST 15 - 41 U/L  17  20   ALT 0 - 44 U/L  12  15   Micro Blood culture pending Abscess culture in 2 to sent   IMAGING RESULTS:  Bilateral paraspinal abscess Left psoas abscess Epidural abscess at L3-L4  I have personally reviewed the  films ? 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 involving l thoracolumbar epidural abscess, paraspinal abscess for which she had undergone laminotomy and  right great toe septic arthritis for which she has undergone surgery Presenting with low back pain bilaterally for more than 2 weeks with worsening in the past few days  Extensive infection of the spinal area with bilateral paraspinal abscess, left psoas abscess, epidural abscess involving L3-L4 and L4-L5, and facet joint septic arthritis on the left L3-L5 Very likely this is going to be MRSA infection She has a drain placed on the left paraspinal area and the right paraspinal abscess has been aspirated It is thought that the left paraspinal abscess in communication with a psoas abscess. 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  Not on CellCept for the last year  History of nausea with weight loss Need to rule out malignancy with with a history of dermatomyositis.   Discussed the management with the patient and her husband  and great detail Also discussed with the sister at bedside.  Discussed with the care team  [facility] on call this weekend.  Available by phone for urgent issue  ? Note:  This document was prepared using Dragon voice recognition software and may include unintentional dictation errors.

## 2022-09-23 NOTE — Consult Note (Signed)
Chief Complaint: Patient was seen in consultation today for paraspinous abscess  Referring Physician(s): Dr. Marcell Barlow  Supervising Physician: Pernell Dupre  Patient Status: [name] - ED  History of Present Illness: Christy Ray is a 67 y.o. 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  Admitted with 3 weeks of back pain at home. CT Abdomen Pelvis shows: 1. Dorsal epidural abscess, measuring up to 5.0 x 1.0 x 1.3 cm.Associated bony erosion of the left L3-4 facet joint, suggestive of septic arthritis. 2. Associated large bilateral paraspinal and left psoas abscesses, as described above. 3. Partially imaged, peripherally enhancing lesion in the anterior mediastinum, possibly representing an additional site of abscess. Chest CT is recommended for further evaluation. 4. Small pericardial effusion.  IR consulted for aspiration/drainage of fluid collections.  Case reviewed by Dr. Juliette Alcide who has discussed with Dr. Marcell Barlow.  Plan made to proceed this afternoon.   Patient assessed at bedside.  She confirms the history of above. She is agreeable to aspiration and drainage.  She understands the risks of the procedure and is agreeable to proceed.   Past Medical History:  Diagnosis Date   Dermatomyositis (HCC)    MRSA (methicillin resistant Staphylococcus aureus)    Polyarthritis     Past Surgical History:  Procedure Laterality Date   INCISION AND DRAINAGE OF WOUND Left 07/14/2021   Procedure: IRRIGATION AND DEBRIDEMENT WOUND;  Surgeon: Rosetta Posner, DPM;  Location: [facility] ORS;  Service: Podiatry;  Laterality: Left;   IR RADIOLOGIST EVAL & MGMT  07/29/2021   LUMBAR LAMINECTOMY FOR EPIDURAL ABSCESS N/A 07/16/2021   Procedure: LUMBAR LAMINECTOMY FOR EPIDURAL ABSCESS;  Surgeon: Venetia Night, MD;  Location: [facility] ORS;  Service: Neurosurgery;  Laterality: N/A;   OSTECTOMY Left 07/14/2021   Procedure: JOINT  RESECTION;  Surgeon: Rosetta Posner, DPM;  Location: [facility] ORS;  Service: Podiatry;  Laterality: Left;    Allergies: Sulfa antibiotics  Medications: Prior to Admission medications   Medication Sig Start Date End Date Taking? Authorizing Provider  amLODipine (NORVASC) 2.5 MG tablet Take 2.5 mg by mouth daily. 06/07/21   [provider]  folic acid (FOLVITE) 1 MG tablet Take 1 mg by mouth daily. 05/28/21   [provider]  hydrOXYzine (ATARAX) 10 MG tablet Take 10 mg by mouth at bedtime. 05/17/21   [provider]  losartan (COZAAR) 100 MG tablet Take 1 tablet (100 mg total) by mouth daily. 07/21/21   Marrion Coy, MD  rosuvastatin (CRESTOR) 10 MG tablet Take 10 mg by mouth daily. 05/11/21   [provider]  senna-docusate (SENOKOT-S) 8.6-50 MG tablet Take 2 tablets by mouth 2 (two) times daily as needed for mild constipation. 07/20/21   Marrion Coy, MD     Family History  Problem Relation Age of Onset   Breast cancer Mother    Hypertension Father    Alzheimer's disease Father    Gout Father     Social History   Socioeconomic History   Marital status: Married    Spouse name: Not on file   Number of children: Not on file   Years of education: Not on file   Highest education level: Not on file  Occupational History   Not on file  Tobacco Use   Smoking status: Former    Current packs/day: 0.00    Types: Cigarettes    Quit date: 07/14/2021    Years since quitting: 1.1   Smokeless tobacco: Never  Substance and Sexual Activity   Alcohol use: No   Drug use: Never   Sexual activity: Yes  Other Topics Concern   Not on file  Social History Narrative   Not on file   Social Determinants of Health   Financial Resource Strain: Low Risk  (02/10/2022)   Received from [facility]   Overall Financial Resource Strain (CARDIA)    Difficulty of Paying Living Expenses: Not hard at all  Food Insecurity: No Food Insecurity (02/10/2022)    Received from [facility]   Hunger Vital Sign    Worried About Running Out of Food in the Last Year: Never true    Ran Out of Food in the Last Year: Never true  Transportation Needs: No Transportation Needs (02/10/2022)   Received from [facility] - Transportation    In the past 12 months, has lack of transportation kept you from medical appointments or from getting medications?: No    Lack of Transportation (Non-Medical): No  Physical Activity: Not on file  Stress: Not on file  Social Connections: Not on file     Review of Systems: A 12 point ROS discussed and pertinent positives are indicated in the HPI above.  All other systems are negative.  Review of Systems  Constitutional:  Positive for fatigue. Negative for fever.  Respiratory:  Negative for cough and shortness of breath.   Cardiovascular:  Negative for chest pain.  Gastrointestinal:  Negative for abdominal pain, nausea and vomiting.  Musculoskeletal:  Positive for back pain.  Psychiatric/Behavioral:  Negative for behavioral problems and confusion.     Vital Signs: BP 126/72 (BP Location: Left Arm)   Pulse 92   Temp 98 F (36.7 C) (Oral)   Resp 18   Ht 5\' 5"  (1.651 m)   Wt 130 lb 15.3 oz (59.4 kg)   SpO2 97%   BMI 21.79 kg/m   Physical Exam Vitals and nursing note reviewed.  Constitutional:      General: She is not in acute distress.    Appearance: Normal appearance. She is not ill-appearing.  HENT:     Mouth/Throat:     Mouth: Mucous membranes are moist.     Pharynx: Oropharynx is clear.  Cardiovascular:     Rate and Rhythm: Normal rate and regular rhythm.  Pulmonary:     Effort: Pulmonary effort is normal.     Breath sounds: Normal breath sounds.  Abdominal:     General: Abdomen is flat.     Palpations: Abdomen is soft.  Skin:    General: Skin is warm and dry.  Neurological:     General: No focal deficit present.     Mental Status: She is alert and  oriented to person, place, and time. Mental status is at baseline.  Psychiatric:        Mood and Affect: Mood normal.        Behavior: Behavior normal.        Thought Content: Thought content normal.        Judgment: Judgment normal.      MD Evaluation Airway: WNL Heart: WNL Abdomen: WNL Chest/ Lungs: WNL ASA  Classification: 3 Mallampati/Airway Score: Two   Imaging: CT ABDOMEN PELVIS W CONTRAST  Result Date: 09/23/2022 CLINICAL DATA:  groin pain, back pain. history of psoas abscess and paraspinal muscle abscess. EXAM: CT ABDOMEN AND PELVIS WITH CONTRAST TECHNIQUE: Multidetector CT imaging of the abdomen and pelvis was performed using  the standard protocol following bolus administration of intravenous contrast. RADIATION DOSE REDUCTION: This exam was performed according to the departmental dose-optimization program which includes automated exposure control, adjustment of the mA and/or kV according to patient size and/or use of iterative reconstruction technique. CONTRAST:  OMNIPAQUE IOHEXOL 300 MG/ML  SOLN COMPARISON:  CT abdomen/pelvis 07/29/2021. FINDINGS: Lower chest: Small pericardial effusion. Within the anterior mediastinum, there is a partially imaged, peripherally enhancing lesion, possibly representing an additional site of abscess. Hepatobiliary: No focal liver abnormality is seen. No gallstones, gallbladder wall thickening, or biliary dilatation. Pancreas: Unremarkable. No pancreatic ductal dilatation or surrounding inflammatory changes. Spleen: Normal in size without focal abnormality. Adrenals/Urinary Tract: Adrenal glands are unremarkable. Kidneys are normal, without renal calculi, focal lesion, or hydronephrosis. Bladder is unremarkable. Stomach/Bowel: Stomach is within normal limits. Normal appendix is visualized on coronal image 33 series 5. No evidence of bowel wall thickening, distention, or inflammatory changes. Vascular/Lymphatic: Aortic atherosclerosis. No enlarged  abdominal or pelvic lymph nodes. Reproductive: Uterus and bilateral adnexa are unremarkable. Other: No abdominal wall hernia or abnormality. No abdominopelvic ascites. Musculoskeletal: Peripherally enhancing, dorsal epidural fluid collection, measuring up to 5.0 x 1.0 x 1.3 cm (axial image 35 series 2, sagittal image 57 series 6), consistent with epidural abscess. Associated bony erosion of the left L3-4 facet joint (axial image 36 series 2), suggestive of septic arthritis. Associated large bilateral paraspinal and left psoas abscesses. The left paraspinal abscess measures up to 11.3 x 4.5 cm in the sagittal plane (image 67 series 6). The right paraspinal abscess measures up to 8.5 x 3.7 cm in the sagittal plane (image 52 series 6). The left psoas abscess measures up to 6.4 x 2.8 cm in the sagittal plane (image 69 series 6). IMPRESSION: 1. Dorsal epidural abscess, measuring up to 5.0 x 1.0 x 1.3 cm. Associated bony erosion of the left L3-4 facet joint, suggestive of septic arthritis. 2. Associated large bilateral paraspinal and left psoas abscesses, as described above. 3. Partially imaged, peripherally enhancing lesion in the anterior mediastinum, possibly representing an additional site of abscess. Chest CT is recommended for further evaluation. 4. Small pericardial effusion. Aortic Atherosclerosis (ICD10-I70.0). Critical Value/emergent results were called by telephone at the time of interpretation on 09/23/2022 at 11:55 am to provider [facility] , who verbally acknowledged these results. Electronically Signed   By: Orvan Falconer M.D.   On: 09/23/2022 12:09    Labs:  CBC: Recent Labs    10/12/21 1007 09/23/22 1038  WBC 6.9 23.4*  HGB 11.2* 12.2  HCT 36.2 36.8  PLT 349 389    COAGS: Recent Labs    09/23/22 1206  INR 1.2  APTT 36    BMP: Recent Labs    10/12/21 1007 09/23/22 1038  NA 140 136  K 3.5 3.7  CL 106 102  CO2 26 26  GLUCOSE 70 115*  BUN 16 17  CALCIUM 9.1 8.8*   CREATININE 0.60 0.58  GFRNONAA >60 >60    LIVER FUNCTION TESTS: Recent Labs    10/12/21 1007  BILITOT 0.5  AST 17  ALT 12  ALKPHOS 301*  PROT 7.7  ALBUMIN 3.4*    TUMOR MARKERS: No results for input(s): "AFPTM", "CEA", "CA199", "CHROMGRNA" in the last 8760 hours.  Assessment and Plan: Back pain Patient with CT findings suggestive of bilateral psoas muscle/paraspinous fluid collections vs. Abscesses.  Patient immunocompromised, on steroids at home, with history of MRSA.  She has been assessed by Dr. Marcell Barlow who has recommended IR  aspiration and drainage.  Procedure discussed with patient who is agreeable to proceed.  She has been NPO this afternoon.  Discussed with ED PA, patient is to be admitted to hospitalist service.   Risks and benefits discussed with the patient including bleeding, infection, damage to adjacent structures, bowel perforation/fistula connection, and sepsis.  All of the patient's questions were answered, patient is agreeable to proceed. Consent signed and in chart.  Thank you for this interesting consult.  I greatly enjoyed meeting Christy Ray and look forward to participating in their care.  A copy of this report was sent to the requesting provider on this date.  Electronically Signed: Hoyt Koch, PA 09/23/2022, 2:27 PM   I spent a total of 40 Minutes    in face to face in clinical consultation, greater than 50% of which was counseling/coordinating care for paraspinous abscesses.

## 2022-09-23 NOTE — ED Notes (Signed)
Husband updated to pt's room status

## 2022-09-23 NOTE — ED Provider Notes (Signed)
[facility] Provider Note    Event Date/Time   First MD Initiated Contact with Patient 09/23/22 1009     (approximate)   History   Back Pain   HPI  Christy Ray is a 67 y.o. female with a past medical history of dermatomyositis on immunosuppression (plaquenil), history of septic arthritis of the left great toe, history of epidural abscess from L2-L5 and T7-T12, with paraspinal muscle abscess and psoas abscess 1 year ago who presents today for evaluation of back pain and groin pain.  Patient reports that 3 weeks ago she was trying to change a light bulb and felt a strain in her right low back.  She reports that she went to [facility] and was given anti-inflammatories and this improved her pain.  However, she reports that she has since developed left-sided back pain and pain in her left groin, and reports pain with movement of her left leg.  She has not had a fever, but reports that when she had the epidural abscess previously she also did not have a fever.  She denies weakness or paresthesias in her legs.  She reports that she has pain in her buttock area as well in the left side.  She has been taking Medrol Dosepak as prescribed by [facility].  She has not had any urinary or fecal incontinence or retention.  She denies saddle anesthesia.  She reports that she was taken off many of her immunosuppressant agents after her previous epidural abscess and the only one that she is still taking is the hydroxychloroquine.  Patient Active Problem List   Diagnosis Date Noted   Epidural abscess 09/23/2022   Sepsis (HCC) 09/23/2022   Epidural abscess, L2-L5 07/15/2021   Abscess in epidural space of thoracic spine 07/15/2021   Psoas abscess (HCC) 07/15/2021   Septic arthritis of IP joint of toe, left (HCC) 07/14/2021   MRSA bacteremia 07/13/2021   Cellulitis of left foot 07/12/2021   Gout 07/12/2021   Hypokalemia 07/12/2021   Essential hypertension 07/12/2021    Dermatomyositis (HCC) 07/12/2021   Nicotine dependence 07/12/2021          Physical Exam   Triage Vital Signs: ED Triage Vitals  Encounter Vitals Group     BP 09/23/22 1005 (!) 142/90     Systolic BP Percentile --      Diastolic BP Percentile --      Pulse Rate 09/23/22 1005 94     Resp 09/23/22 1005 18     Temp 09/23/22 1005 98 F (36.7 C)     Temp Source 09/23/22 1005 Oral     SpO2 09/23/22 1005 95 %     Weight 09/23/22 1004 130 lb 15.3 oz (59.4 kg)     Height 09/23/22 1004 5\' 5"  (1.651 m)     Head Circumference --      Peak Flow --      Pain Score 09/23/22 1004 9     Pain Loc --      Pain Education --      Exclude from Growth Chart --     Most recent vital signs: Vitals:   09/23/22 1030 09/23/22 1421  BP: 129/82 126/72  Pulse: 86 92  Resp:  18  Temp:    SpO2: 97% 97%    Physical Exam Vitals and nursing note reviewed.  Constitutional:      General: Awake and alert. No acute distress.    Appearance: Normal appearance. The patient is normal weight.  HENT:     Head: Normocephalic and atraumatic.     Mouth: Mucous membranes are moist.  Eyes:     General: PERRL. Normal EOMs        Right eye: No discharge.        Left eye: No discharge.     Conjunctiva/sclera: Conjunctivae normal.  Cardiovascular:     Rate and Rhythm: Normal rate and regular rhythm.     Pulses: Normal pulses.  Pulmonary:     Effort: Pulmonary effort is normal. No respiratory distress.     Breath sounds: Normal breath sounds.  Abdominal:     Abdomen is soft. There is no abdominal tenderness. No rebound or guarding. No distention.  Mild tenderness palpation to left inguinal area without bulges or skin changes noted.  No swelling. Musculoskeletal:        General: No swelling. Normal range of motion.     Cervical back: Normal range of motion and neck supple.  Back: No midline tenderness.  Tenderness to palpation to left paraspinal muscle area.  No skin changes noted to back.  Tenderness to  palpation to left buttock without overlying skin changes.  Tenderness to palpation to left lumbar paraspinal muscle area.  Able to flex left hip, though significant pain with doing so.  No pain with flexion of the right hip.  Strength and sensation 5/5 to bilateral lower extremities. Normal great toe extension against resistance. Normal sensation throughout feet. Normal patellar reflexes.  Pain with SLR of the left leg and negative opposite SLR. Skin:    General: Skin is warm and dry.     Capillary Refill: Capillary refill takes less than 2 seconds.     Findings: No rash.  Neurological:     Mental Status: The patient is awake and alert.      ED Results / Procedures / Treatments   Labs (all labs ordered are listed, but only abnormal results are displayed) Labs Reviewed  CBC WITH DIFFERENTIAL/PLATELET - Abnormal; Notable for the following components:      Result Value   WBC 23.4 (*)    Neutro Abs 20.3 (*)    Monocytes Absolute 1.3 (*)    Abs Immature Granulocytes 0.46 (*)    All other components within normal limits  BASIC METABOLIC PANEL - Abnormal; Notable for the following components:   Glucose, Bld 115 (*)    Calcium 8.8 (*)    All other components within normal limits  SEDIMENTATION RATE - Abnormal; Notable for the following components:   Sed Rate 60 (*)    All other components within normal limits  PROTIME-INR - Abnormal; Notable for the following components:   Prothrombin Time 15.5 (*)    All other components within normal limits  CULTURE, BLOOD (ROUTINE X 2)  CULTURE, BLOOD (ROUTINE X 2)  BODY FLUID CULTURE W GRAM STAIN  AEROBIC/ANAEROBIC CULTURE W GRAM STAIN (SURGICAL/DEEP WOUND)  CSF CULTURE W GRAM STAIN  LACTIC ACID, PLASMA  APTT  C-REACTIVE PROTEIN  LACTIC ACID, PLASMA     EKG     RADIOLOGY I independently reviewed and interpreted imaging and agree with radiologists findings.     PROCEDURES:  Critical Care performed:   .Critical Care  Performed  by: Jackelyn Hoehn, PA-C Authorized by: Jackelyn Hoehn, PA-C   Critical care provider statement:    Critical care time (minutes):  45   Critical care time was exclusive of:  Separately billable procedures and treating other patients   Critical care was  necessary to treat or prevent imminent or life-threatening deterioration of the following conditions:  Sepsis and CNS failure or compromise   Critical care was time spent personally by me on the following activities:  Development of treatment plan with patient or surrogate, discussions with consultants, evaluation of patient's response to treatment, examination of patient, ordering and review of laboratory studies, ordering and review of radiographic studies, ordering and performing treatments and interventions, pulse oximetry, re-evaluation of patient's condition, review of old charts and obtaining history from patient or surrogate   I assumed direction of critical care for this patient from another provider in my specialty: no     Care discussed with: admitting provider     Care discussed with comment:  Dr. Myer Haff (neurosurgery), Dr. Juliette Alcide (Interventional Radiology), Dr. Rivka Safer (Infectious Disease), Dr. Sedalia Muta (hospitalist), Dr. Ernestina Penna (radiology), Dr. Cyril Loosen (ER attending), pharmacy    MEDICATIONS ORDERED IN ED: Medications  lactated ringers infusion (has no administration in time range)  acetaminophen (TYLENOL) tablet 650 mg (has no administration in time range)    Or  acetaminophen (TYLENOL) suppository 650 mg (has no administration in time range)  ondansetron (ZOFRAN) tablet 4 mg (has no administration in time range)    Or  ondansetron (ZOFRAN) injection 4 mg (has no administration in time range)  lactated ringers bolus 500 mL (has no administration in time range)  senna-docusate (Senokot-S) tablet 1 tablet (has no administration in time range)  metroNIDAZOLE (FLAGYL) IVPB 500 mg (has no administration in time range)  vancomycin  (VANCOREADY) IVPB 500 mg/100 mL (has no administration in time range)  vancomycin (VANCOCIN) IVPB 1000 mg/200 mL premix (has no administration in time range)  ceFEPIme (MAXIPIME) 2 g in sodium chloride 0.9 % 100 mL IVPB (has no administration in time range)  hydrALAZINE (APRESOLINE) injection 5 mg (has no administration in time range)  morphine (PF) 2 MG/ML injection 2 mg (has no administration in time range)  morphine (PF) 4 MG/ML injection 4 mg (has no administration in time range)  fentaNYL (SUBLIMAZE) injection 12.5 mcg (has no administration in time range)  iohexol (OMNIPAQUE) 300 MG/ML solution 100 mL (100 mLs Intravenous Contrast Given 09/23/22 1115)  ceFEPIme (MAXIPIME) 2 g in sodium chloride 0.9 % 100 mL IVPB (0 g Intravenous Stopped 09/23/22 1234)  metroNIDAZOLE (FLAGYL) IVPB 500 mg (500 mg Intravenous New Bag/Given 09/23/22 1228)  vancomycin (VANCOCIN) IVPB 1000 mg/200 mL premix (1,000 mg Intravenous New Bag/Given 09/23/22 1233)  morphine (PF) 4 MG/ML injection 4 mg (4 mg Intravenous Given 09/23/22 1215)  ondansetron (ZOFRAN) injection 4 mg (4 mg Intravenous Given 09/23/22 1215)  gadobutrol (GADAVIST) 1 MMOL/ML injection 6 mL (6 mLs Intravenous Contrast Given 09/23/22 1356)     IMPRESSION / MDM / ASSESSMENT AND PLAN / ED COURSE  I reviewed the triage vital signs and the nursing notes.   Differential diagnosis includes, but is not limited to, epidural abscess, psoas abscess, paraspinal muscle abscess, muscle strain, sepsis, Sirs, spasm.  I reviewed the patient's chart.  Patient was admitted 1 year ago for findings of epidural abscess extending from T7-T12 and L2-L3, as well as paraspinal muscle abscess and psoas abscess.  Patient had a psoas abscess drain placed as well as laminectomy and abscess decompression, spinal procedure performed by Dr. Marcell Barlow.  Patient is awake and alert, hemodynamically stable and afebrile.  She is nontoxic in appearance.  Given her extensive past medical  history with regards to her previous infections, further workup is indicated.  IV was established and labs  were obtained.  Labs are revealing for a leukocytosis to 23, with elevated ESR.  CT scan of her abdomen and pelvis and MRI of her thoracic and lumbar spine ordered for evaluation of abscess.  Patient does not have any signs or symptoms of cord compression/cauda equina at this time.  She is ambulatory with a steady gait.  She has normal strength and sensation of bilateral lower extremities.  She has no urinary or fecal incontinence or retention or saddle anesthesia.  I received a call from the radiologist, with findings of dorsal epidural abscess measuring 5 x 1 x 1.3 cm with associated bony erosion of the left L3-L4 facet joint suggestive of septic arthritis, with associated large bilateral paraspinal left psoas abscesses, as well as a peripherally enhancing lesion in the anterior mediastinum and a small pericardial effusion.  Patient was immediately started on broad-spectrum antibiotics and sepsis order set initiated.  I immediately consulted Dr. Marcell Barlow with neurosurgery who feels that the best management is drainage placed by IR and Dr. Juliette Alcide consulted and agreed to place the drain.  Patient was subsequently brought to MRI for further evaluation and then directly to IR.  Dr. Rivka Safer with infectious disease was also consulted.  Patient was accepted to the hospitalist service by Dr. Londell Moh.  Case was discussed with Dr. Cyril Loosen who agrees with assessment and plan.   Patient's presentation is most consistent with acute presentation with potential threat to life or bodily function.   Clinical Course as of 09/23/22 1437  Fri Sep 23, 2022  1155 Call from the radiologist, patient has epidural abscess, paraspinal muscle abscess, and psoas abscess [JP]  1204 Called MRI, she is next [JP]  1211 IMPRESSION: 1. Dorsal epidural abscess, measuring up to 5.0 x 1.0 x 1.3 cm. Associated bony erosion  of the left L3-4 facet joint, suggestive of septic arthritis. 2. Associated large bilateral paraspinal and left psoas abscesses, as described above. 3. Partially imaged, peripherally enhancing lesion in the anterior mediastinum, possibly representing an additional site of abscess. Chest CT is recommended for further evaluation. 4. Small pericardial effusion.   [JP]  1215 Discussed with Dr. Myer Haff who feels that IR should perform an aspiration, and patient should be admitted to the hospitalist for IR and ID.  He will await MRI imaging. He will message IR and ID [JP]    Clinical Course User Index [JP] Lantz Hermann, Herb Grays, PA-C     FINAL CLINICAL IMPRESSION(S) / ED DIAGNOSES   Final diagnoses:  Epidural abscess  Abscess of paraspinal muscles  Psoas muscle abscess (HCC)  Mediastinal abscess (HCC)  Pericardial effusion     Rx / DC Orders   ED Discharge Orders     None        Note:  This document was prepared using Dragon voice recognition software and may include unintentional dictation errors.   Keturah Shavers 09/23/22 1437    Jene Every, MD 09/23/22 1505

## 2022-09-23 NOTE — Sepsis Progress Note (Signed)
Sepsis protocol monitored by e[employer] ?

## 2022-09-23 NOTE — Progress Notes (Signed)
PHARMACY -  BRIEF ANTIBIOTIC NOTE   Pharmacy has received consult(s) for sepsis from an ED provider.  The patient's profile has been reviewed for ht/wt/allergies/indication/available labs.    One time order(s) placed for cefepime and vancomycin  Further antibiotics/pharmacy consults should be ordered by admitting physician if indicated.                       Thank you, Rockwell Alexandria 09/23/2022  11:52 AM

## 2022-09-24 DIAGNOSIS — M6008 Infective myositis, other site: Secondary | ICD-10-CM | POA: Diagnosis not present

## 2022-09-24 DIAGNOSIS — K6812 Psoas muscle abscess: Secondary | ICD-10-CM

## 2022-09-24 DIAGNOSIS — G062 Extradural and subdural abscess, unspecified: Secondary | ICD-10-CM | POA: Diagnosis not present

## 2022-09-24 DIAGNOSIS — M3313 Other dermatomyositis without myopathy: Secondary | ICD-10-CM

## 2022-09-24 DIAGNOSIS — E871 Hypo-osmolality and hyponatremia: Secondary | ICD-10-CM | POA: Insufficient documentation

## 2022-09-24 MED ORDER — VANCOMYCIN HCL 1250 MG/250ML IV SOLN
1250.0000 mg | INTRAVENOUS | Status: DC
  Administered 2022-09-24 – 2022-09-26 (×3): 1250 mg via INTRAVENOUS
  Filled 2022-09-24 (×3): qty 250

## 2022-09-24 MED ORDER — ENOXAPARIN SODIUM 40 MG/0.4ML IJ SOSY
40.0000 mg | PREFILLED_SYRINGE | Freq: Every day | INTRAMUSCULAR | Status: DC
  Filled 2022-09-24 (×3): qty 0.4

## 2022-09-24 NOTE — Plan of Care (Signed)

## 2022-09-24 NOTE — Plan of Care (Signed)
  Problem: Activity: Goal: Risk for activity intolerance will decrease Outcome: Progressing   Problem: Coping: Goal: Level of anxiety will decrease Outcome: Progressing   Problem: Elimination: Goal: Will not experience complications related to bowel motility Outcome: Progressing Goal: Will not experience complications related to urinary retention Outcome: Progressing   Problem: Safety: Goal: Ability to remain free from injury will improve Outcome: Progressing   Problem: Pain Managment: Goal: General experience of comfort will improve Outcome: Progressing

## 2022-09-24 NOTE — Consult Note (Signed)
Referring Physician:  No referring provider defined for this encounter.  Primary Physician:  Ethelda Chick, MD  Chief Complaint:  low back pain, left toe pain  History of Present Illness: 09/24/2022 Christy Ray is a 67 y.o. female who presents with the chief complaint of back pain.  She began having some back symptoms a couple weeks ago, but is been worsening over the past 3 to 5 days.  She began having increased pain in the left groin, which prompted her to ultimately presents to the emergency department where workup showed evidence of soft tissue infection near her prior site of epidural abscess.  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  She did require a lumbar laminotomy to allow for drainage of the epidural component at that time.  She underwent percutaneous ultrasound-guided aspiration of the soft tissue components of her likely MRSA infection yesterday.  She has no new complaints of neurologic issues.  She has no numbness or radicular type pain.  She has full control of her bowel and bladder.   Review of Systems:  A 10 point review of systems is negative, except for the pertinent positives and negatives detailed in the HPI.  Past Medical History: Past Medical History:  Diagnosis Date   Dermatomyositis (HCC)    MRSA (methicillin resistant Staphylococcus aureus)    Polyarthritis     Past Surgical History: Past Surgical History:  Procedure Laterality Date   INCISION AND DRAINAGE OF WOUND Left 07/14/2021   Procedure: IRRIGATION AND DEBRIDEMENT WOUND;  Surgeon: Rosetta Posner, DPM;  Location: [facility] ORS;  Service: Podiatry;  Laterality: Left;   IR RADIOLOGIST EVAL & MGMT  07/29/2021   LUMBAR LAMINECTOMY FOR EPIDURAL ABSCESS N/A 07/16/2021   Procedure: LUMBAR LAMINECTOMY FOR EPIDURAL ABSCESS;  Surgeon: Venetia Night, MD;  Location: [facility] ORS;  Service: Neurosurgery;  Laterality: N/A;   OSTECTOMY Left 07/14/2021   Procedure: JOINT RESECTION;   Surgeon: Rosetta Posner, DPM;  Location: [facility] ORS;  Service: Podiatry;  Laterality: Left;    Allergies: Allergies as of 09/23/2022 - Review Complete 09/23/2022  Allergen Reaction Noted   Sulfa antibiotics Rash 10/08/2021    Medications:  Current Facility-Administered Medications:    acetaminophen (TYLENOL) tablet 650 mg, 650 mg, Oral, Q6H PRN, 650 mg at 09/23/22 1609 **OR** acetaminophen (TYLENOL) suppository 650 mg, 650 mg, Rectal, Q6H PRN, Cox, Amy N, DO   amLODipine (NORVASC) tablet 5 mg, 5 mg, Oral, Daily, Cox, Amy N, DO, 5 mg at 09/23/22 1609   ceFEPIme (MAXIPIME) 2 g in sodium chloride 0.9 % 100 mL IVPB, 2 g, Intravenous, Q12H, Cox, Amy N, DO, Last Rate: 200 mL/hr at 09/23/22 2344, 2 g at 09/23/22 2344   folic acid (FOLVITE) tablet 1 mg, 1 mg, Oral, Daily, Cox, Amy N, DO, 1 mg at 09/23/22 1609   hydrALAZINE (APRESOLINE) injection 5 mg, 5 mg, Intravenous, Q8H PRN, Cox, Amy N, DO   losartan (COZAAR) tablet 100 mg, 100 mg, Oral, Daily, Cox, Amy N, DO, 100 mg at 09/23/22 1609   melatonin tablet 5 mg, 5 mg, Oral, QHS PRN, Cox, Amy N, DO, 5 mg at 09/23/22 2026   metroNIDAZOLE (FLAGYL) IVPB 500 mg, 500 mg, Intravenous, Q12H, Ravishankar, Jayashree, MD, Last Rate: 100 mL/hr at 09/24/22 0925, 500 mg at 09/24/22 0925   morphine (PF) 2 MG/ML injection 2 mg, 2 mg, Intravenous, Q4H PRN, Cox, Amy N, DO   morphine (PF) 4 MG/ML injection 4 mg, 4 mg, Intravenous, Q4H PRN, Cox,  Amy N, DO, 4 mg at 09/24/22 0126   ondansetron (ZOFRAN) tablet 4 mg, 4 mg, Oral, Q6H PRN **OR** ondansetron (ZOFRAN) injection 4 mg, 4 mg, Intravenous, Q6H PRN, Cox, Amy N, DO, 4 mg at 09/24/22 0536   rosuvastatin (CRESTOR) tablet 10 mg, 10 mg, Oral, QHS, Cox, Amy N, DO, 10 mg at 09/23/22 2026   senna-docusate (Senokot-S) tablet 2 tablet, 2 tablet, Oral, BID PRN, Cox, Amy N, DO   vancomycin (VANCOREADY) IVPB 1250 mg/250 mL, 1,250 mg, Intravenous, Q24H, Tressie Ellis, RPH   Social History: Social History   Tobacco Use    Smoking status: Former    Current packs/day: 0.00    Types: Cigarettes    Quit date: 07/14/2021    Years since quitting: 1.1   Smokeless tobacco: Never  Substance Use Topics   Alcohol use: No   Drug use: Never    Family Medical History: Family History  Problem Relation Age of Onset   Breast cancer Mother    Hypertension Father    Alzheimer's disease Father    Gout Father     Physical Examination: Vitals:   09/24/22 0550 09/24/22 0857  BP: 130/75 131/75  Pulse: 85 81  Resp: 16 16  Temp: 99.2 F (37.3 C) 98.7 F (37.1 C)  SpO2: 98% 98%    Heart sounds normal no MRG. Chest Clear to Auscultation Bilaterally.  General: Patient is well developed, well nourished, calm, collected, and in no apparent distress.   NEUROLOGICAL:  General: In no acute distress.   Awake, alert, oriented to person, place, and time.  Pupils equal round and reactive to light.  Facial tone is symmetric.  Tongue protrusion is midline.  There is no pronator drift.   Strength: Side Biceps Triceps Deltoid Interossei Grip Wrist Ext. Wrist Flex.  R 5 5 5 5 5 5 5   L 5 5 5 5 5 5 5    Side Iliopsoas Quads Hamstring PF DF EHL  R 5 5 5 5 5 5   L 5 5 5 5 5 5     Bilateral upper and lower extremity sensation is intact to light touch. Reflexes are 1+ and symmetric at the biceps, triceps, brachioradialis, patella and achilles. Hoffman's is absent.  Clonus is not tested. Gait not tested.   Imaging: MRI TL spine 09/23/2022 IMPRESSION: 1. Previously seen mid to lower thoracic epidural fluid collection has resolved. No residual collection or abnormal postcontrast enhancement within the thoracic canal. 2. Mild multilevel thoracic spondylosis. Mild left foraminal stenosis at T10-T11. No canal stenosis at any level.     Electronically Signed   By: Duanne Guess D.O.   On: 09/23/2022 14:54  IMPRESSION: 1. Findings compatible with facet joint septic arthritis and osteomyelitis involving the left L3-4  and L4-5 facet joints. 2. Large posterior epidural abscess spanning from the L3 level to the L5-S1 level measuring up to 2.1 x 1.0 cm transaxially and spanning 8.0 cm in length. This has increased in size compared to the previous MRI. There is associated severe canal stenosis at the L3-4 disc level and L4 vertebral body level. 3. Severe left foraminal stenosis at L3-4. The epidural abscess fills the left neural foramen at this level. 4. Large posterior paraspinal intramuscular abscesses within the lumbar region measuring 8.3 x 3.2 x 4.3 cm on the right and 12.0 x 4.3 x 4.8 cm on the left. Left psoas muscle abscess measuring 7.5 x 2.8 x 2.9 cm. Paraspinal muscle abscesses have increased in size since 2023.  Electronically Signed   By: Duanne Guess D.O.   On: 09/23/2022 15:08   I have personally reviewed the images and agree with the above interpretation.  Labs:    Latest Ref Rng & Units 09/24/2022    4:24 AM 09/23/2022   10:38 AM 10/12/2021   10:07 AM  CBC  WBC 4.0 - 10.5 K/uL 18.0  23.4  6.9   Hemoglobin 12.0 - 15.0 g/dL 66.0  63.0  16.0   Hematocrit 36.0 - 46.0 % 32.0  36.8  36.2   Platelets 150 - 400 K/uL 332  389  349     Micro - +WBC, GPC's in aspirate from 09/23/2022   Assessment and Plan: Christy Ray is a pleasant 67 y.o. female with likely methicillin-resistant Staphylococcus aureus infection.  There is an epidural component at the prior site of her lumbar laminectomy.  It appears to be contiguous with the paraspinous muscular collection that was drained yesterday.  She has a drain in place in the left-sided paraspinous muscular connection.  There is also an area of psoas infection.  I do not think she needs surgical intervention currently.  I agree with current plan to reassess her burden of disease with a CT scan earlier in the week.  She may ultimately require another drain placement or reassessment for possible surgical washout.      Chloeanne Poteet K. Myer Haff MD,  [facility] Dept. of Neurosurgery

## 2022-09-24 NOTE — Progress Notes (Signed)
Progress Note   Patient: Christy Ray WJX:[id number] DOB: 1955-03-12 DOA: 09/23/2022     1 DOS: the patient was seen and examined on 09/24/2022   Brief hospital course: [name] is a 67 year old female with history of dermatomyositis, hypertension, hyperlipidemia, who presents to the emergency department for chief concerns of low back pain. Patient had a disseminated MRSA infection in 2023, involving left great toe septic arthritis, left psoas abscess, multilevel epidural abscess.  She had a lumbar laminectomy, completed 6 weeks IV antibiotics followed by oral Bactrim.  Bactrim was discontinued due to allergy. Patient was doing well until recently when he developed significant back pain. She has significant cytosis of 23.4, CRP 11.9.  Sed rate 60.  CT scan showed dorsal epidural abscess measuring 5.0 x 1.0 x 1.3 cm with associated bony erosion of the left L3-L4 facet joint suggestive of septic arthritis. Bilateral paraspinal and left psoas abscesses.  MRI of the lumbar spine showed large posterior paraspinal intramuscular abscess on the right and on the left as well as the left psoas muscle abscess.  Compression of the cauda equina nerve roots At L3-L4 secondary to space-occupying epidural abscess. Left L3-L4 and L4-L5 facet joint bone marrow edema and subtle erosive changes noted in the posterior margin of the left L3-L4 and L3 left L4-L5 facet joints consistent with septic arthritis and osteomyelitis.   Patient is seen by neurosurgery, ID, IR drain was performed on 7/26.  Patient is treated with vancomycin and cefepime.   Principal Problem:   Epidural abscess Active Problems:   Abscess in epidural space of thoracic spine   Psoas muscle abscess (HCC)   Sepsis (HCC)   Dermatomyositis (HCC)   Gout   Essential hypertension   Hyperlipidemia   Headache   Hyponatremia   Abscess of paraspinal muscles   Assessment and Plan: *Bilateral paraspinal abscess  Epidural abscess of the  lumbar spine. Lumbar facet joint septic arthritis. Psoas muscle abscess. Sepsis [facility]) Patient met sepsis criteria with increased heart rate and leukocytosis.  She is hemodynamically stable.  She is a status post IR drain of abscess.  Culture results still pending.  Most likely MRSA. Patient has been seen by ID and neurosurgery. Discussed with Dr. Myer Haff from neurosurgery, patient does not need surgery. Patient still draining significant thick purulent fluids, will continue current antibiotics until culture results available. Will place a PICC line when blood culture came back as negative.  dermatomyositis/polymyositis  Immunodeficient status. Patient was on CellCept previously, impaired her immune system.  Essential hypertension Resume home medicines.     Subjective:  Patient doing well today, still has some pain in the bilateral groin area, no significant back pain.  Physical Exam: Vitals:   09/23/22 2001 09/24/22 0009 09/24/22 0550 09/24/22 0857  BP: 129/76 121/79 130/75 131/75  Pulse: 83 77 85 81  Resp: 16 16 16 16   Temp: 98 F (36.7 C) 98.3 F (36.8 C) 99.2 F (37.3 C) 98.7 F (37.1 C)  TempSrc: Oral Oral Oral Oral  SpO2: 95% 97% 98% 98%  Weight:      Height:       General exam: Appears calm and comfortable  Respiratory system: Clear to auscultation. Respiratory effort normal. Cardiovascular system: S1 & S2 heard, RRR. No JVD, murmurs, rubs, gallops or clicks. No pedal edema. Gastrointestinal system: Abdomen is nondistended, soft and nontender. No organomegaly or masses felt. Normal bowel sounds heard. Central nervous system: Alert and oriented. No focal neurological deficits. Extremities: Symmetric 5 x 5  power. Skin: No rashes, lesions or ulcers Psychiatry: Judgement and insight appear normal. Mood & affect appropriate.    Data Reviewed:  Reviewed CT scan results MRI results and lab results.  Family Communication: None  Disposition: Status is:  Inpatient Remains inpatient appropriate because: Severity of disease, IV treatment.     Time spent: 35 minutes  Author: Marrion Coy, MD 09/24/2022 12:46 PM  For on call review [email or web address].

## 2022-09-24 NOTE — Progress Notes (Signed)
Pharmacy Antibiotic Note  Christy Ray is a 67 y.o. female admitted on 09/23/2022 with sepsis.  Pharmacy has been consulted for cefepime and vancomycin dosing. Patient is also ordered metronidazole.  Patient presenting with a chief complaint of back pain persisting for several weeks. In ED, patient is afebrile with WBC of 23.4. CT of abdomen shows epidural abscess. IR is planning aspiration and drainage.   Plan:  Vancomycin 1.5 g IV LD 7/26 followed by Vancomycin 1.25 g IV q24h --Calculated AUC: 541, Cmin 12 --Scr 0.8, IBW, Vd 0.72 --Daily Scr per protocol, levels at steady state or as clinically indicated  Monitor renal function, clinical status, culture data, and LOT  Height: 5\' 5"  (165.1 cm) Weight: 59.4 kg (130 lb 15.3 oz) IBW/kg (Calculated) : 57  Temp (24hrs), Avg:98.4 F (36.9 C), Min:98 F (36.7 C), Max:99.2 F (37.3 C)  Recent Labs  Lab 09/23/22 1038 09/23/22 1203 09/23/22 1547 09/24/22 0424  WBC 23.4*  --   --  18.0*  CREATININE 0.58  --   --  0.56  LATICACIDVEN  --  1.0 1.0  --     Estimated Creatinine Clearance: 59.7 mL/min (by C-G formula based on SCr of 0.56 mg/dL).    Allergies  Allergen Reactions   Sulfa Antibiotics Rash    Antimicrobials this admission: cefepime 7/26 >>  vancomycin 7/26 >>  Metronidazole 7/26 x1  Dose adjustments this admission: N/A  Microbiology results: 7/26 BCx: NGTD  Thank you for involving pharmacy in this patient's care.   Tressie Ellis 09/24/2022 9:13 AM

## 2022-09-24 NOTE — H&P (View-Only) (Signed)
Referring Physician:  No referring provider defined for this encounter.  Primary Physician:  Ethelda Chick, MD  Chief Complaint:  low back pain, left toe pain  History of Present Illness: 09/24/2022 Christy Ray is a 67 y.o. female who presents with the chief complaint of back pain.  She began having some back symptoms a couple weeks ago, but is been worsening over the past 3 to 5 days.  She began having increased pain in the left groin, which prompted her to ultimately presents to the emergency department where workup showed evidence of soft tissue infection near her prior site of epidural abscess.  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  She did require a lumbar laminotomy to allow for drainage of the epidural component at that time.  She underwent percutaneous ultrasound-guided aspiration of the soft tissue components of her likely MRSA infection yesterday.  She has no new complaints of neurologic issues.  She has no numbness or radicular type pain.  She has full control of her bowel and bladder.   Review of Systems:  A 10 point review of systems is negative, except for the pertinent positives and negatives detailed in the HPI.  Past Medical History: Past Medical History:  Diagnosis Date   Dermatomyositis (HCC)    MRSA (methicillin resistant Staphylococcus aureus)    Polyarthritis     Past Surgical History: Past Surgical History:  Procedure Laterality Date   INCISION AND DRAINAGE OF WOUND Left 07/14/2021   Procedure: IRRIGATION AND DEBRIDEMENT WOUND;  Surgeon: Rosetta Posner, DPM;  Location: [facility] ORS;  Service: Podiatry;  Laterality: Left;   IR RADIOLOGIST EVAL & MGMT  07/29/2021   LUMBAR LAMINECTOMY FOR EPIDURAL ABSCESS N/A 07/16/2021   Procedure: LUMBAR LAMINECTOMY FOR EPIDURAL ABSCESS;  Surgeon: Venetia Night, MD;  Location: [facility] ORS;  Service: Neurosurgery;  Laterality: N/A;   OSTECTOMY Left 07/14/2021   Procedure: JOINT RESECTION;   Surgeon: Rosetta Posner, DPM;  Location: [facility] ORS;  Service: Podiatry;  Laterality: Left;    Allergies: Allergies as of 09/23/2022 - Review Complete 09/23/2022  Allergen Reaction Noted   Sulfa antibiotics Rash 10/08/2021    Medications:  Current Facility-Administered Medications:    acetaminophen (TYLENOL) tablet 650 mg, 650 mg, Oral, Q6H PRN, 650 mg at 09/23/22 1609 **OR** acetaminophen (TYLENOL) suppository 650 mg, 650 mg, Rectal, Q6H PRN, Cox, Amy N, DO   amLODipine (NORVASC) tablet 5 mg, 5 mg, Oral, Daily, Cox, Amy N, DO, 5 mg at 09/23/22 1609   ceFEPIme (MAXIPIME) 2 g in sodium chloride 0.9 % 100 mL IVPB, 2 g, Intravenous, Q12H, Cox, Amy N, DO, Last Rate: 200 mL/hr at 09/23/22 2344, 2 g at 09/23/22 2344   folic acid (FOLVITE) tablet 1 mg, 1 mg, Oral, Daily, Cox, Amy N, DO, 1 mg at 09/23/22 1609   hydrALAZINE (APRESOLINE) injection 5 mg, 5 mg, Intravenous, Q8H PRN, Cox, Amy N, DO   losartan (COZAAR) tablet 100 mg, 100 mg, Oral, Daily, Cox, Amy N, DO, 100 mg at 09/23/22 1609   melatonin tablet 5 mg, 5 mg, Oral, QHS PRN, Cox, Amy N, DO, 5 mg at 09/23/22 2026   metroNIDAZOLE (FLAGYL) IVPB 500 mg, 500 mg, Intravenous, Q12H, Ravishankar, Jayashree, MD, Last Rate: 100 mL/hr at 09/24/22 0925, 500 mg at 09/24/22 0925   morphine (PF) 2 MG/ML injection 2 mg, 2 mg, Intravenous, Q4H PRN, Cox, Amy N, DO   morphine (PF) 4 MG/ML injection 4 mg, 4 mg, Intravenous, Q4H PRN, Cox,  Amy N, DO, 4 mg at 09/24/22 0126   ondansetron (ZOFRAN) tablet 4 mg, 4 mg, Oral, Q6H PRN **OR** ondansetron (ZOFRAN) injection 4 mg, 4 mg, Intravenous, Q6H PRN, Cox, Amy N, DO, 4 mg at 09/24/22 0536   rosuvastatin (CRESTOR) tablet 10 mg, 10 mg, Oral, QHS, Cox, Amy N, DO, 10 mg at 09/23/22 2026   senna-docusate (Senokot-S) tablet 2 tablet, 2 tablet, Oral, BID PRN, Cox, Amy N, DO   vancomycin (VANCOREADY) IVPB 1250 mg/250 mL, 1,250 mg, Intravenous, Q24H, Tressie Ellis, RPH   Social History: Social History   Tobacco Use    Smoking status: Former    Current packs/day: 0.00    Types: Cigarettes    Quit date: 07/14/2021    Years since quitting: 1.1   Smokeless tobacco: Never  Substance Use Topics   Alcohol use: No   Drug use: Never    Family Medical History: Family History  Problem Relation Age of Onset   Breast cancer Mother    Hypertension Father    Alzheimer's disease Father    Gout Father     Physical Examination: Vitals:   09/24/22 0550 09/24/22 0857  BP: 130/75 131/75  Pulse: 85 81  Resp: 16 16  Temp: 99.2 F (37.3 C) 98.7 F (37.1 C)  SpO2: 98% 98%    Heart sounds normal no MRG. Chest Clear to Auscultation Bilaterally.  General: Patient is well developed, well nourished, calm, collected, and in no apparent distress.   NEUROLOGICAL:  General: In no acute distress.   Awake, alert, oriented to person, place, and time.  Pupils equal round and reactive to light.  Facial tone is symmetric.  Tongue protrusion is midline.  There is no pronator drift.   Strength: Side Biceps Triceps Deltoid Interossei Grip Wrist Ext. Wrist Flex.  R 5 5 5 5 5 5 5   L 5 5 5 5 5 5 5    Side Iliopsoas Quads Hamstring PF DF EHL  R 5 5 5 5 5 5   L 5 5 5 5 5 5     Bilateral upper and lower extremity sensation is intact to light touch. Reflexes are 1+ and symmetric at the biceps, triceps, brachioradialis, patella and achilles. Hoffman's is absent.  Clonus is not tested. Gait not tested.   Imaging: MRI TL spine 09/23/2022 IMPRESSION: 1. Previously seen mid to lower thoracic epidural fluid collection has resolved. No residual collection or abnormal postcontrast enhancement within the thoracic canal. 2. Mild multilevel thoracic spondylosis. Mild left foraminal stenosis at T10-T11. No canal stenosis at any level.     Electronically Signed   By: Duanne Guess D.O.   On: 09/23/2022 14:54  IMPRESSION: 1. Findings compatible with facet joint septic arthritis and osteomyelitis involving the left L3-4  and L4-5 facet joints. 2. Large posterior epidural abscess spanning from the L3 level to the L5-S1 level measuring up to 2.1 x 1.0 cm transaxially and spanning 8.0 cm in length. This has increased in size compared to the previous MRI. There is associated severe canal stenosis at the L3-4 disc level and L4 vertebral body level. 3. Severe left foraminal stenosis at L3-4. The epidural abscess fills the left neural foramen at this level. 4. Large posterior paraspinal intramuscular abscesses within the lumbar region measuring 8.3 x 3.2 x 4.3 cm on the right and 12.0 x 4.3 x 4.8 cm on the left. Left psoas muscle abscess measuring 7.5 x 2.8 x 2.9 cm. Paraspinal muscle abscesses have increased in size since 2023.  Electronically Signed   By: Duanne Guess D.O.   On: 09/23/2022 15:08   I have personally reviewed the images and agree with the above interpretation.  Labs:    Latest Ref Rng & Units 09/24/2022    4:24 AM 09/23/2022   10:38 AM 10/12/2021   10:07 AM  CBC  WBC 4.0 - 10.5 K/uL 18.0  23.4  6.9   Hemoglobin 12.0 - 15.0 g/dL 66.0  63.0  16.0   Hematocrit 36.0 - 46.0 % 32.0  36.8  36.2   Platelets 150 - 400 K/uL 332  389  349     Micro - +WBC, GPC's in aspirate from 09/23/2022   Assessment and Plan: Ms. Stone is a pleasant 67 y.o. female with likely methicillin-resistant Staphylococcus aureus infection.  There is an epidural component at the prior site of her lumbar laminectomy.  It appears to be contiguous with the paraspinous muscular collection that was drained yesterday.  She has a drain in place in the left-sided paraspinous muscular connection.  There is also an area of psoas infection.  I do not think she needs surgical intervention currently.  I agree with current plan to reassess her burden of disease with a CT scan earlier in the week.  She may ultimately require another drain placement or reassessment for possible surgical washout.       K. Myer Haff MD,  [facility] Dept[facility] of [facility]surgery

## 2022-09-25 DIAGNOSIS — M3313 Other dermatomyositis without myopathy: Secondary | ICD-10-CM | POA: Diagnosis not present

## 2022-09-25 DIAGNOSIS — K6812 Psoas muscle abscess: Secondary | ICD-10-CM | POA: Diagnosis not present

## 2022-09-25 DIAGNOSIS — G061 Intraspinal abscess and granuloma: Secondary | ICD-10-CM

## 2022-09-25 LAB — CREATININE, SERUM
Creatinine, Ser: 0.5 mg/dL (ref 0.44–1.00)
GFR, Estimated: >60 mL/min (ref >60)

## 2022-09-25 LAB — AEROBIC/ANAEROBIC CULTURE W GRAM STAIN (SURGICAL/DEEP WOUND)

## 2022-09-25 LAB — SEDIMENTATION RATE: Sed Rate: 64 mm/h — ABNORMAL HIGH (ref 0–30)

## 2022-09-25 MED ORDER — SENNOSIDES-DOCUSATE SODIUM 8.6-50 MG PO TABS
2.0000 | ORAL_TABLET | Freq: Two times a day (BID) | ORAL | Status: DC
  Administered 2022-09-25 – 2022-10-04 (×17): 2 via ORAL
  Filled 2022-09-25 (×18): qty 2

## 2022-09-25 MED ORDER — POLYETHYLENE GLYCOL 3350 17 G PO PACK
17.0000 g | PACK | Freq: Once | ORAL | Status: AC
  Administered 2022-09-25: 17 g via ORAL
  Filled 2022-09-25: qty 1

## 2022-09-25 NOTE — Progress Notes (Signed)
Progress Note   Patient: Christy Ray:[id number] DOB: [birth year] DOA: 09/23/2022     2 DOS: the patient was seen and examined on 09/25/2022   Brief hospital course: Christy Ray is a 67 year old female with history of dermatomyositis, hypertension, hyperlipidemia, who presents to the emergency department for chief concerns of low back pain. Patient had a disseminated MRSA infection in 2023, involving left great toe septic arthritis, left psoas abscess, multilevel epidural abscess.  She had a lumbar laminectomy, completed 6 weeks IV antibiotics followed by oral Bactrim.  Bactrim was discontinued due to allergy. Patient was doing well until recently when he developed significant back pain. She has significant cytosis of 23.4, CRP 11.9.  Sed rate 60.  CT scan showed dorsal epidural abscess measuring 5.0 x 1.0 x 1.3 cm with associated bony erosion of the left L3-L4 facet joint suggestive of septic arthritis. Bilateral paraspinal and left psoas abscesses.  MRI of the lumbar spine showed large posterior paraspinal intramuscular abscess on the right and on the left as well as the left psoas muscle abscess.  Compression of the cauda equina nerve roots At L3-L4 secondary to space-occupying epidural abscess. Left L3-L4 and L4-L5 facet joint bone marrow edema and subtle erosive changes noted in the posterior margin of the left L3-L4 and L3 left L4-L5 facet joints consistent with septic arthritis and osteomyelitis.   Patient is seen by neurosurgery, ID, IR drain was performed on 7/26.  Patient is treated with vancomycin and cefepime.   Principal Problem:   Epidural abscess Active Problems:   Abscess in epidural space of thoracic spine   Psoas muscle abscess (HCC)   Sepsis (HCC)   Dermatomyositis (HCC)   Gout   Essential hypertension   Hyperlipidemia   Headache   Hyponatremia   Abscess of paraspinal muscles   Assessment and Plan: Bilateral paraspinal abscess  Epidural abscess of the  lumbar spine. Lumbar facet joint septic arthritis. Psoas muscle abscess. Sepsis [facility]) Patient met sepsis criteria with increased heart rate and leukocytosis.  She is hemodynamically stable.  She is a status post IR drain of abscess.  Culture results still pending.  Most likely MRSA. Patient has been seen by ID and neurosurgery. Discussed with Dr. Myer Haff from neurosurgery, patient does not need surgery. Blood culture came back negative, wound culture no growth Staphylococcus, susceptibilities still pending.  Continue current antibiotics with cefepime and vancomycin.   dermatomyositis/polymyositis  Immunodeficient status. Patient was on CellCept previously, impaired her immune system.   Essential hypertension Resume home medicines.   Constipation. Start stool softener.     Subjective:  Patient has been nauseous, no vomiting.  Last bowel movement was Thursday.  Physical Exam: Vitals:   09/24/22 1608 09/24/22 2111 09/25/22 0442 09/25/22 0752  BP: 123/82 (!) 163/86 (!) 151/88 125/84  Pulse: 80 85 85 74  Resp: 18 20 16 16   Temp: 98.4 F (36.9 C) 98.1 F (36.7 C) 97.9 F (36.6 C) 98.5 F (36.9 C)  TempSrc: Oral Oral Oral   SpO2: 99% 99% 97% 99%  Weight:      Height:       General exam: Appears calm and comfortable  Respiratory system: Clear to auscultation. Respiratory effort normal. Cardiovascular system: S1 & S2 heard, RRR. No JVD, murmurs, rubs, gallops or clicks. No pedal edema. Gastrointestinal system: Abdomen is nondistended, soft and nontender. No organomegaly or masses felt. Normal bowel sounds heard. Central nervous system: Alert and oriented. No focal neurological deficits. Extremities: Symmetric 5 x 5 power.  Skin: No rashes, lesions or ulcers Psychiatry: Judgement and insight appear normal. Mood & affect appropriate.    Data Reviewed:  Lab results reviewed.  Family Communication: None  Disposition: Status is: Inpatient Remains inpatient appropriate  because: Severity disease, IV treatment.     Time spent: 35 minutes  Author: Marrion Coy, MD 09/25/2022 11:18 AM  For on call review [email or web address].

## 2022-09-26 ENCOUNTER — Inpatient Hospital Stay: Payer: Medicare Other

## 2022-09-26 DIAGNOSIS — G061 Intraspinal abscess and granuloma: Secondary | ICD-10-CM | POA: Diagnosis not present

## 2022-09-26 DIAGNOSIS — B9562 Methicillin resistant Staphylococcus aureus infection as the cause of diseases classified elsewhere: Secondary | ICD-10-CM | POA: Diagnosis not present

## 2022-09-26 DIAGNOSIS — K6812 Psoas muscle abscess: Secondary | ICD-10-CM | POA: Diagnosis not present

## 2022-09-26 DIAGNOSIS — M6008 Infective myositis, other site: Secondary | ICD-10-CM | POA: Diagnosis not present

## 2022-09-26 DIAGNOSIS — G062 Extradural and subdural abscess, unspecified: Secondary | ICD-10-CM | POA: Diagnosis not present

## 2022-09-26 DIAGNOSIS — M3313 Other dermatomyositis without myopathy: Secondary | ICD-10-CM | POA: Diagnosis not present

## 2022-09-26 LAB — AEROBIC/ANAEROBIC CULTURE W GRAM STAIN (SURGICAL/DEEP WOUND): Gram Stain: NONE SEEN

## 2022-09-26 LAB — VANCOMYCIN, PEAK: Vancomycin Pk: 24 ug/mL — ABNORMAL LOW (ref 30–40)

## 2022-09-26 LAB — VANCOMYCIN, TROUGH: Vancomycin Tr: 5 ug/mL — ABNORMAL LOW (ref 15–20)

## 2022-09-26 MED ORDER — SODIUM CHLORIDE 0.9% FLUSH
5.0000 mL | Freq: Three times a day (TID) | INTRAVENOUS | Status: DC
  Administered 2022-09-26 – 2022-10-04 (×23): 5 mL

## 2022-09-26 MED ORDER — IOHEXOL 9 MG/ML PO SOLN
500.0000 mL | ORAL | Status: AC
  Administered 2022-09-26 (×2): 500 mL via ORAL

## 2022-09-26 MED ORDER — ORAL CARE MOUTH RINSE: 15.0000 mL | OROMUCOSAL | Status: DC | PRN

## 2022-09-26 MED ORDER — VANCOMYCIN HCL IN DEXTROSE 1-5 GM/200ML-% IV SOLN
1000.0000 mg | Freq: Two times a day (BID) | INTRAVENOUS | Status: DC
  Administered 2022-09-27 – 2022-10-04 (×15): 1000 mg via INTRAVENOUS
  Filled 2022-09-26 (×16): qty 200

## 2022-09-26 MED ORDER — ENSURE ENLIVE PO LIQD
237.0000 mL | Freq: Three times a day (TID) | ORAL | Status: DC
  Administered 2022-09-26 – 2022-10-03 (×3): 237 mL via ORAL

## 2022-09-26 MED ORDER — ADULT MULTIVITAMIN W/MINERALS CH
1.0000 | ORAL_TABLET | Freq: Every day | ORAL | Status: DC
  Administered 2022-09-26 – 2022-10-04 (×8): 1 via ORAL
  Filled 2022-09-26 (×8): qty 1

## 2022-09-26 NOTE — Progress Notes (Signed)
Date of Admission:  09/23/2022     ID: Christy Ray is a 67 y.o. 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   Psoas muscle abscess (HCC)   Sepsis (HCC)   Hyperlipidemia   Headache   Hyponatremia   Abscess of paraspinal muscles    Subjective: Patient feeling better Back pain is better  Medications:   amLODipine  5 mg Oral Daily   enoxaparin (LOVENOX) injection  40 mg Subcutaneous Daily   folic acid  1 mg Oral Daily   losartan  100 mg Oral Daily   rosuvastatin  10 mg Oral QHS   senna-docusate  2 tablet Oral BID   sodium chloride flush  5 mL Intracatheter Q8H    Objective: Vital signs in last 24 hours: Patient Vitals for the past 24 hrs:  BP Temp Temp src Pulse Resp SpO2  09/26/22 0803 (!) 149/98 98.7 F (37.1 C) Oral 93 14 97 %  09/26/22 0508 (!) 156/88 98.7 F (37.1 C) Oral 85 16 97 %  09/26/22 0034 (!) 162/92 98.7 F (37.1 C) Oral 88 18 97 %  09/25/22 2109 (!) 149/89 97.9 F (36.6 C) -- 80 18 96 %  09/25/22 1609 126/83 98.7 F (37.1 C) -- 78 17 97 %      PHYSICAL EXAM:  General: Alert, cooperative, no distress, emaciated Lungs: Clear to auscultation bilaterally. No Wheezing or Rhonchi. No rales. Heart: Regular rate and rhythm, no murmur, rub or gallop. Abdomen: Soft, non-tender,not distended. Bowel sounds normal. No masses Extremities: atraumatic, no cyanosis. No edema. No clubbing Skin: No rashes or lesions. Or bruising Lymph: Cervical, supraclavicular normal. Neurologic: Grossly non-focal Back on the left side there is a drain  Lab Results    Latest Ref Rng & Units 09/24/2022    4:24 AM 09/23/2022   10:38 AM 10/12/2021   10:07 AM  CBC  WBC 4.0 - 10.5 K/uL 18.0  23.4  6.9   Hemoglobin 12.0 - 15.0 g/dL 81.1  91.4  78.2   Hematocrit 36.0 - 46.0 % 32.0  36.8  36.2   Platelets 150 - 400 K/uL 332  389  349        Latest Ref  Rng & Units 09/25/2022    4:37 AM 09/24/2022    4:24 AM 09/23/2022   10:38 AM  CMP  Glucose 70 - 99 mg/dL  95  956   BUN 8 - 23 mg/dL  14  17   Creatinine 2.13 - 1.00 mg/dL 0.86  5.78  4.69   Sodium 135 - 145 mmol/L  134  136   Potassium 3.5 - 5.1 mmol/L  3.8  3.7   Chloride 98 - 111 mmol/L  103  102   CO2 22 - 32 mmol/L  24  26   Calcium 8.9 - 10.3 mg/dL  8.4  8.8       Microbiology: MRSA and abscess culture    Assessment/Plan: 67 year old female with history of dermatomyositis/polymyositis was on CellCept until May 2023 when she had disseminated MRSA infection involving the thoracolumbar spine, epidural abscess, there is an abscess for which she underwent laminotomy and right great toe septic arthritis for which she has undergone surgery. Patient was treated at that time with 10 weeks of antibiotics  She presented last week with back pain of 2 weeks duration and worse over the last  few days  Extensive infection of the lumbar spine with bilateral paraspinal abscess, left psoas abscess, epidural abscess involving L3-L4 and L4-L5, facet joint septic arthritis on the left L3-L5 A drain was placed on the left so paraspinal abscess was drained Cultures MRSA Patient is currently on vancomycin cefepime and Flagyl Will discontinue cefepime and Flagyl Will continue with vancomycin Repeat imaging is needed to see whether there is residual abscess Need to evacuate abscess pockets so as to  reduce bioburden to make the antibiotics effective   Discussed the management with the patient, her husband and the care team

## 2022-09-26 NOTE — Progress Notes (Addendum)
Pharmacy Antibiotic Note  Christy Ray is a 67 y.o. female admitted on 09/23/2022 with sepsis.  Patient was also ordered metronidazole and cefepime, but these have been discontinued by the provider. Patient presenting with a chief complaint of back pain persisting for several weeks. In ED, patient is afebrile with WBC of 23.4. CT of abdomen shows epidural abscess. IR performed aspiration and drainage. Pharmacy has been consulted for vancomycin dosing.   Plan: Vancomycin 1.5 g IV LD 7/26 followed by Vancomycin 1.25 g IV q24h --Calculated AUC: 541, Cmin 12 --Scr 0.8, IBW, Vd 0.72 --Will check levels today and adjust Vancomycin dose per results  Monitor renal function, clinical status, culture data, and LOT  Height: 5\' 5"  (165.1 cm) Weight: 59.4 kg (130 lb 15.3 oz) IBW/kg (Calculated) : 57  Temp (24hrs), Avg:98.5 F (36.9 C), Min:97.9 F (36.6 C), Max:98.7 F (37.1 C)  Recent Labs  Lab 09/23/22 1038 09/23/22 1203 09/23/22 1547 09/24/22 0424 09/25/22 0437  WBC 23.4*  --   --  18.0*  --   CREATININE 0.58  --   --  0.56 0.50  LATICACIDVEN  --  1.0 1.0  --   --     Estimated Creatinine Clearance: 59.7 mL/min (by C-G formula based on SCr of 0.5 mg/dL).    Allergies  Allergen Reactions   Sulfa Antibiotics Rash    Antimicrobials this admission: 7/26 Cefepime >> 7/29 7/26Vancomycin  >>  7/26 Metronidazole >> 7/29  Dose adjustments this admission: N/A  Microbiology results: 7/26 BCx: NGTD 7/26 Abscess Cx: MRSA  Thank you for involving pharmacy in this patient's care.   Minette Brine, PharmD Student  09/26/2022 10:32 AM

## 2022-09-26 NOTE — Progress Notes (Signed)
Pharmacy Antibiotic Note  LIBRADA BOTT is a 67 y.o. female admitted on 09/23/2022 with sepsis.  Pharmacy has been consulted for cefepime and vancomycin dosing.  Patient presenting with a chief complaint of back pain persisting for several weeks. CT of abdomen shows epidural abscess with cultures growing MRSA. Vancomycin levels at steady state: peak 25, trough 5.   Plan: Increase vancomycin to 1000 mg IV q12 hours based on steady-state levels (eAUC 475.4) Continue metronidazole per MD Daily Scr per protocol, levels at steady state or as clinically indicated Monitor renal function, clinical status, culture data, and LOT  Height: 5\' 5"  (165.1 cm) Weight: 59.4 kg (130 lb 15.3 oz) IBW/kg (Calculated) : 57  Temp (24hrs), Avg:98.4 F (36.9 C), Min:97.9 F (36.6 C), Max:98.7 F (37.1 C)  Recent Labs  Lab 09/23/22 1038 09/23/22 1203 09/23/22 1547 09/24/22 0424 09/25/22 0437 09/26/22 1104 09/26/22 1505  WBC 23.4*  --   --  18.0*  --   --   --   CREATININE 0.58  --   --  0.56 0.50  --   --   LATICACIDVEN  --  1.0 1.0  --   --   --   --   VANCOTROUGH  --   --   --   --   --  5*  --   VANCOPEAK  --   --   --   --   --   --  24*    Estimated Creatinine Clearance: 59.7 mL/min (by C-G formula based on SCr of 0.5 mg/dL).    Allergies  Allergen Reactions   Sulfa Antibiotics Rash   Antimicrobials this admission: cefepime 7/26 >> 7/29 vancomycin 7/26 >>  Metronidazole 7/26 >> 7/29  Dose adjustments this admission: N/A  Microbiology results: 7/26 BCx: NGTD 7/26 Abscess Cx: MRSA  Thank you for involving pharmacy in this patient's care.   Rockwell Alexandria 09/26/2022 8:03 PM

## 2022-09-26 NOTE — TOC Initial Note (Signed)
Transition of Care [facility]) - Initial/Assessment Note    Patient Details  Name: Christy Ray MRN: [id number] Date of Birth: [birth year]  Transition of Care [facility]) CM/SW Contact:    Allena Katz, LCSW Phone Number: 09/26/2022, 2:30 PM  Clinical Narrative:    Pt admitted from home for epidural abscess. TOC following for home antibiotic needs.                     Patient Goals and CMS Choice            Expected Discharge Plan and Services                                              Prior Living Arrangements/Services                       Activities of Daily Living Home Assistive Devices/Equipment: None ADL Screening (condition at time of admission) Patient's cognitive ability adequate to safely complete daily activities?: Yes Is the patient deaf or have difficulty hearing?: No Does the patient have difficulty seeing, even when wearing glasses/contacts?: No Does the patient have difficulty concentrating, remembering, or making decisions?: No Patient able to express need for assistance with ADLs?: Yes Does the patient have difficulty dressing or bathing?: No Independently performs ADLs?: Yes (appropriate for developmental age) Does the patient have difficulty walking or climbing stairs?: No Weakness of Legs: None Weakness of Arms/Hands: None  Permission Sought/Granted                  Emotional Assessment              Admission diagnosis:  Psoas muscle abscess (HCC) [K68.12] Epidural abscess [G06.2] Pericardial effusion [I31.39] Abscess of paraspinal muscles [M60.08] Mediastinal abscess (HCC) [J85.3] Patient Active Problem List   Diagnosis Date Noted   Hyponatremia 09/24/2022   Abscess of paraspinal muscles 09/24/2022   Epidural abscess 09/23/2022   Sepsis (HCC) 09/23/2022   Hyperlipidemia 09/23/2022   Headache 09/23/2022   Epidural abscess, L2-L5 07/15/2021   Abscess in epidural space of thoracic spine 07/15/2021   Psoas  muscle abscess (HCC) 07/15/2021   Septic arthritis of IP joint of toe, left (HCC) 07/14/2021   MRSA bacteremia 07/13/2021   Cellulitis of left foot 07/12/2021   Gout 07/12/2021   Hypokalemia 07/12/2021   Essential hypertension 07/12/2021   Dermatomyositis (HCC) 07/12/2021   Nicotine dependence 07/12/2021   PCP:  Ethelda Chick, MD Pharmacy:   [facility] [phone] [place] [place] - [address] [place] [address] Phone: [phone] Fax: [phone]     Social Determinants of Health (SDOH) Social History: SDOH Screenings   Food Insecurity: Patient Unable To Answer (09/23/2022)  Housing: Patient Declined (09/23/2022)  Transportation Needs: Patient Declined (09/23/2022)  Utilities: Patient Declined (09/23/2022)  Depression (PHQ2-9): Low Risk  (10/12/2021)  Financial Resource Strain: Low Risk  (02/10/2022)   Received from [facility]  Tobacco Use: Medium Risk (09/23/2022)   SDOH Interventions:     Readmission Risk Interventions     No data to display

## 2022-09-26 NOTE — Progress Notes (Signed)
Progress Note   Patient: Christy Ray ZOX:[id number] DOB: [birth year] DOA: 09/23/2022     3 DOS: the patient was seen and examined on 09/26/2022   Brief hospital course: [name] is a 67 year old female with history of dermatomyositis, hypertension, hyperlipidemia, who presents to the emergency department for chief concerns of low back pain. Patient had a disseminated MRSA infection in 2023, involving left great toe septic arthritis, left psoas abscess, multilevel epidural abscess.  She had a lumbar laminectomy, completed 6 weeks IV antibiotics followed by oral Bactrim.  Bactrim was discontinued due to allergy. Patient was doing well until recently when he developed significant back pain. She has significant cytosis of 23.4, CRP 11.9.  Sed rate 60.  CT scan showed dorsal epidural abscess measuring 5.0 x 1.0 x 1.3 cm with associated bony erosion of the left L3-L4 facet joint suggestive of septic arthritis. Bilateral paraspinal and left psoas abscesses.  MRI of the lumbar spine showed large posterior paraspinal intramuscular abscess on the right and on the left as well as the left psoas muscle abscess.  Compression of the cauda equina nerve roots At L3-L4 secondary to space-occupying epidural abscess. Left L3-L4 and L4-L5 facet joint bone marrow edema and subtle erosive changes noted in the posterior margin of the left L3-L4 and L3 left L4-L5 facet joints consistent with septic arthritis and osteomyelitis.   Patient is seen by neurosurgery, ID, IR drain was performed on 7/26.  Patient is treated with vancomycin and cefepime.   Principal Problem:   Epidural abscess Active Problems:   Abscess in epidural space of thoracic spine   Psoas muscle abscess (HCC)   Sepsis (HCC)   Dermatomyositis (HCC)   Gout   Essential hypertension   Hyperlipidemia   Headache   Hyponatremia   Abscess of paraspinal muscles   Assessment and Plan: Bilateral paraspinal abscess  Epidural abscess of the  lumbar spine. Lumbar facet joint septic arthritis. Psoas muscle abscess. Sepsis [facility]) Patient met sepsis criteria with increased heart rate and leukocytosis.  She is hemodynamically stable.  She is a status post IR drain of abscess.  Culture results still pending.  Most likely MRSA. Patient has been seen by ID and neurosurgery. Discussed with Dr. Myer Haff from neurosurgery, patient does not need surgery. Blood culture came back negative, wound culture grew MRSA, susceptible to vancomycin.  Antibiotics per ID. Neurosurgery is considering reassess for IR drain versus surgical washout.  dermatomyositis/polymyositis  Immunodeficient status. Patient was on CellCept previously, impaired her immune system.   Essential hypertension Resume home medicines.   Constipation. Start stool softener.      Subjective:  Patient doing well today, no complaint.  Physical Exam: Vitals:   09/25/22 2109 09/26/22 0034 09/26/22 0508 09/26/22 0803  BP: (!) 149/89 (!) 162/92 (!) 156/88 (!) 149/98  Pulse: 80 88 85 93  Resp: 18 18 16 14   Temp: 97.9 F (36.6 C) 98.7 F (37.1 C) 98.7 F (37.1 C) 98.7 F (37.1 C)  TempSrc:  Oral Oral Oral  SpO2: 96% 97% 97% 97%  Weight:      Height:       General exam: Appears calm and comfortable  Respiratory system: Clear to auscultation. Respiratory effort normal. Cardiovascular system: S1 & S2 heard, RRR. No JVD, murmurs, rubs, gallops or clicks. No pedal edema. Gastrointestinal system: Abdomen is nondistended, soft and nontender. No organomegaly or masses felt. Normal bowel sounds heard. Central nervous system: Alert and oriented. No focal neurological deficits. Extremities: Symmetric 5 x 5  power. Skin: No rashes, lesions or ulcers Psychiatry: Judgement and insight appear normal. Mood & affect appropriate.    Data Reviewed:  Lab results reviewed.  Family Communication: None  Disposition: Status is: Inpatient Remains inpatient appropriate because:  Severity of disease, IV treatment.     Time spent: 35 minutes  Author: Marrion Coy, MD 09/26/2022 12:10 PM  For on call review [email or web address].

## 2022-09-26 NOTE — Care Management Important Message (Signed)
Important Message  Patient Details  Name: Christy Ray MRN: [id number] Date of Birth: [birth year]   Medicare Important Message Given:  Yes     Bernadette Hoit 09/26/2022, 3:18 PM

## 2022-09-26 NOTE — Progress Notes (Signed)
Initial Nutrition Assessment  DOCUMENTATION CODES:   Non-severe (moderate) malnutrition in context of acute illness/injury  INTERVENTION:   -Ensure Enlive po TID, each supplement provides 350 kcal and 20 grams of protein -MVI with minerals daily -Liberalize diet to regular for widest variety of meal selections  NUTRITION DIAGNOSIS:   Moderate Malnutrition related to acute illness as evidenced by mild fat depletion, mild muscle depletion, moderate muscle depletion.  GOAL:   Patient will meet greater than or equal to 90% of their needs  MONITOR:   PO intake, Supplement acceptance  REASON FOR ASSESSMENT:   Malnutrition Screening Tool    ASSESSMENT:   Pt with history of dermatomyositis, hypertension, hyperlipidemia, who presents for chief concerns of low back pain.  Pt admitted with bilateral paraspinal abscess and epidural abscess of the lumbar spine.   Reviewed I/O's: +835 ml x 24 hours and +2.6 L since admission  Spoke with pt at bedside, who was pleasant and in good spirits today. Pt reports feeling better and with improved appetite. She consumed 100% of pancakes and 50% of a chicken salad sandwich today.  Pt reports that she usually consumes 3 meals per day (Breakfast: cereal; Lunch: snack; Dinner: meat, starch, and vegetable). Per pt, she has had a decreased appetite since Friday (09/23/22) secondary to nausea, which has improved. Per pt, she has some esophageal ulcers which are being worked with with GI (these have improved with medication management).   Pt shares his UBW is around 135# and that he has lost about 10# within the past 2-3 months. Noted wt has been stable over the past year. Pt reports concern about weight loss and has been drinking 3 Equate Nutritional Drinks daily. Pt also reports being stressed lately secondary to the recent death of her father.    Discussed importance of good meal and supplement intake to promote healing. Pt is amenable with Ensure.    Medications reviewed and include folic acid, vancomycin, and senokot.   Labs reviewed: Na: 134.    NUTRITION - FOCUSED PHYSICAL EXAM:  Flowsheet Row Most Recent Value  Orbital Region Mild depletion  Upper Arm Region Mild depletion  Thoracic and Lumbar Region No depletion  Buccal Region No depletion  Temple Region No depletion  Clavicle Bone Region Moderate depletion  Clavicle and Acromion Bone Region Moderate depletion  Scapular Bone Region Moderate depletion  Dorsal Hand Mild depletion  Patellar Region No depletion  Anterior Thigh Region No depletion  Posterior Calf Region No depletion  Edema (RD Assessment) Mild  Hair Reviewed  Eyes Reviewed  Mouth Reviewed  Skin Reviewed  Nails Reviewed       Diet Order:   Diet Order             Diet regular Fluid consistency: Thin  Diet effective now                   EDUCATION NEEDS:   Education needs have been addressed  Skin:  Skin Assessment: Reviewed RN Assessment  Last BM:  09/23/22  Height:   Ht Readings from Last 1 Encounters:  09/23/22 5\' 5"  (1.651 m)    Weight:   Wt Readings from Last 1 Encounters:  09/23/22 59.4 kg    Ideal Body Weight:  56.8 kg  BMI:  Body mass index is 21.79 kg/m.  Estimated Nutritional Needs:   Kcal:  1750-1950  Protein:  90-105 grams  Fluid:  > 1.7 L    Levada Schilling, RD, LDN, CDCES Registered Dietitian II  Certified Diabetes Care and Education Specialist Please refer to [facility] for RD and/or RD on-call/weekend/after hours pager

## 2022-09-27 ENCOUNTER — Inpatient Hospital Stay: Payer: Medicare Other

## 2022-09-27 DIAGNOSIS — B9562 Methicillin resistant Staphylococcus aureus infection as the cause of diseases classified elsewhere: Secondary | ICD-10-CM | POA: Diagnosis not present

## 2022-09-27 DIAGNOSIS — M6008 Infective myositis, other site: Secondary | ICD-10-CM | POA: Diagnosis not present

## 2022-09-27 DIAGNOSIS — E440 Moderate protein-calorie malnutrition: Secondary | ICD-10-CM | POA: Insufficient documentation

## 2022-09-27 DIAGNOSIS — G061 Intraspinal abscess and granuloma: Secondary | ICD-10-CM | POA: Diagnosis not present

## 2022-09-27 DIAGNOSIS — K6812 Psoas muscle abscess: Secondary | ICD-10-CM | POA: Diagnosis not present

## 2022-09-27 DIAGNOSIS — A4902 Methicillin resistant Staphylococcus aureus infection, unspecified site: Secondary | ICD-10-CM

## 2022-09-27 DIAGNOSIS — M3313 Other dermatomyositis without myopathy: Secondary | ICD-10-CM | POA: Diagnosis not present

## 2022-09-27 DIAGNOSIS — G062 Extradural and subdural abscess, unspecified: Secondary | ICD-10-CM | POA: Diagnosis not present

## 2022-09-27 LAB — PROTIME-INR
INR: 1.3 — ABNORMAL HIGH (ref 0.8–1.2)
Prothrombin Time: 16.7 seconds — ABNORMAL HIGH (ref 11.4–15.2)

## 2022-09-27 LAB — AEROBIC/ANAEROBIC CULTURE W GRAM STAIN (SURGICAL/DEEP WOUND)

## 2022-09-27 MED ORDER — FENTANYL CITRATE (PF) 100 MCG/2ML IJ SOLN
INTRAMUSCULAR | Status: AC
  Filled 2022-09-27: qty 2

## 2022-09-27 MED ORDER — MIDAZOLAM HCL 2 MG/2ML IJ SOLN
INTRAMUSCULAR | Status: AC
  Filled 2022-09-27: qty 2

## 2022-09-27 MED ORDER — LIDOCAINE 1 % OPTIME INJ - NO CHARGE
10.0000 mL | Freq: Once | INTRAMUSCULAR | Status: AC
  Administered 2022-09-27: 10 mL via INTRADERMAL

## 2022-09-27 MED ORDER — SODIUM CHLORIDE 0.9% FLUSH
5.0000 mL | Freq: Three times a day (TID) | INTRAVENOUS | Status: DC
  Administered 2022-09-27 – 2022-10-04 (×21): 5 mL

## 2022-09-27 MED ORDER — MIDAZOLAM HCL 2 MG/2ML IJ SOLN
INTRAMUSCULAR | Status: AC | PRN
  Administered 2022-09-27 (×2): .5 mg via INTRAVENOUS

## 2022-09-27 MED ORDER — LIDOCAINE HCL (PF) 1 % IJ SOLN
5.0000 mL | Freq: Once | INTRAMUSCULAR | Status: AC
  Administered 2022-09-27: 5 mL via INTRADERMAL
  Filled 2022-09-27: qty 5

## 2022-09-27 MED ORDER — FENTANYL CITRATE (PF) 100 MCG/2ML IJ SOLN
INTRAMUSCULAR | Status: AC | PRN
  Administered 2022-09-27 (×2): 25 ug via INTRAVENOUS

## 2022-09-27 MED ORDER — OXYCODONE-ACETAMINOPHEN 5-325 MG PO TABS
1.0000 | ORAL_TABLET | Freq: Four times a day (QID) | ORAL | Status: DC | PRN
  Administered 2022-09-27 – 2022-10-04 (×10): 1 via ORAL
  Filled 2022-09-27 (×10): qty 1

## 2022-09-27 NOTE — Plan of Care (Signed)
  Problem: Clinical Measurements: Goal: Diagnostic test results will improve Outcome: Not Progressing Goal: Signs and symptoms of infection will decrease Outcome: Not Progressing   Problem: Respiratory: Goal: Ability to maintain adequate ventilation will improve Outcome: Not Progressing   Problem: Fluid Volume: Goal: Hemodynamic stability will improve Outcome: Not Progressing   Problem: Activity: Goal: Risk for activity intolerance will decrease Outcome: Not Progressing   Problem: Nutrition: Goal: Adequate nutrition will be maintained Outcome: Not Progressing

## 2022-09-27 NOTE — Plan of Care (Signed)

## 2022-09-27 NOTE — Progress Notes (Signed)
Referring Physician(s): Dr. Marcell Barlow  Supervising Physician: Gilmer Mor  Patient Status:  [facility] - In-pt  Chief Complaint: History of laminectomy May 2023 now with dorsal epidural abscess, large bilateral paraspinal abscesses and left psoas abscess. S/p aspiration of a right paraspinal abscess and placement of a left paraspinal abscess drain.   Subjective: Patient resting comfortably in bed watching TV. She endorses a moderate amount of left pelvis/groin pain. Left flank/back pain working well and causing only minor discomfort.   Allergies: Sulfa antibiotics  Medications: Prior to Admission medications   Medication Sig Start Date End Date Taking? Authorizing Provider  acyclovir (ZOVIRAX) 400 MG tablet Take 400 mg by mouth 3 (three) times daily.  TAKE 1 TABLET (400 MG TOTAL) BY MOUTH 3 (THREE) TIMES DAILY FOR 5 DAYS AS NEEDED FOR OUTBREAK 03/24/22  Yes [provider]  amLODipine (NORVASC) 5 MG tablet Take 1 tablet by mouth daily. 08/05/22  Yes [provider]  augmented betamethasone dipropionate (DIPROLENE-AF) 0.05 % cream Apply 1 application  topically daily as needed. 08/26/22  Yes [provider]  folic acid (FOLVITE) 1 MG tablet Take 1 mg by mouth daily. 05/28/21  Yes [provider]  losartan (COZAAR) 100 MG tablet Take 1 tablet (100 mg total) by mouth daily. 07/21/21  Yes Marrion Coy, MD  meloxicam (MOBIC) 15 MG tablet Take 15 mg by mouth daily. 09/19/22  Yes [provider]  rosuvastatin (CRESTOR) 10 MG tablet Take 10 mg by mouth daily. 05/11/21  Yes [provider]  senna-docusate (SENOKOT-S) 8.6-50 MG tablet Take 2 tablets by mouth 2 (two) times daily as needed for mild constipation. 07/20/21  Yes Marrion Coy, MD  amLODipine (NORVASC) 2.5 MG tablet Take 2.5 mg by mouth daily. Patient not taking: Reported on 09/23/2022 06/07/21   [provider]  hydroxychloroquine (PLAQUENIL) 200 MG tablet Take 200 mg by mouth  daily. Patient not taking: Reported on 09/23/2022    [provider]  hydrOXYzine (ATARAX) 10 MG tablet Take 10 mg by mouth at bedtime. Patient not taking: Reported on 09/23/2022 05/17/21   [provider]  methylPREDNISolone (MEDROL DOSEPAK) 4 MG TBPK tablet Take by mouth as directed. Patient not taking: Reported on 09/23/2022 09/19/22   [provider]  tiZANidine (ZANAFLEX) 2 MG tablet Take 2 mg by mouth 2 (two) times daily as needed. Patient not taking: Reported on 09/23/2022 09/09/22   [provider]     Vital Signs: BP 120/81 (BP Location: Left Arm)   Pulse 73   Temp 97.9 F (36.6 C) (Oral)   Resp 16   Ht 5\' 5"  (1.651 m)   Wt 130 lb 15.3 oz (59.4 kg)   SpO2 97%   BMI 21.79 kg/m   Physical Exam Constitutional:      General: She is not in acute distress.    Appearance: She is not ill-appearing.  HENT:     Mouth/Throat:     Mouth: Mucous membranes are moist.     Pharynx: Oropharynx is clear.  Pulmonary:     Effort: Pulmonary effort is normal.  Abdominal:     Palpations: Abdomen is soft.     Tenderness: There is abdominal tenderness.     Comments: Left pelvis/groin discomfort. Left flank/back drain to suction. Approximately 20 ml of serosanguineous fluid in bulb. Dressing is clean and dry. Drain flushed this morning by RN.   Skin:    General: Skin is warm and dry.  Neurological:     Mental Status:  She is alert and oriented to person, place, and time.  Psychiatric:        Mood and Affect: Mood normal.        Behavior: Behavior normal.        Thought Content: Thought content normal.        Judgment: Judgment normal.     Imaging: CT ABDOMEN PELVIS WO CONTRAST  Result Date: 09/26/2022 CLINICAL DATA:  Abdominal pain, drain placement EXAM: CT ABDOMEN AND PELVIS WITHOUT CONTRAST TECHNIQUE: Multidetector CT imaging of the abdomen and pelvis was performed following the standard protocol without IV contrast. RADIATION DOSE REDUCTION: This exam  was performed according to the departmental dose-optimization program which includes automated exposure control, adjustment of the mA and/or kV according to patient size and/or use of iterative reconstruction technique. COMPARISON:  09/23/2022 FINDINGS: Lower chest: Small pericardial effusion slightly increased since prior study. New trace left pleural effusion. Hepatobiliary: Unremarkable unenhanced appearance of the liver and gallbladder. Pancreas: Unremarkable unenhanced appearance. Spleen: Unremarkable unenhanced appearance. Adrenals/Urinary Tract: No urinary tract calculi or obstructive uropathy within either kidney. The adrenals are stable. Bladder is unremarkable. Stomach/Bowel: No bowel obstruction or ileus. Normal appendix right lower quadrant. No bowel wall thickening or inflammatory change. Vascular/Lymphatic: Aortic atherosclerosis. No enlarged abdominal or pelvic lymph nodes. Reproductive: Uterus and bilateral adnexa are unremarkable. Other: No free fluid or free intraperitoneal gas. No abdominal wall hernia. Musculoskeletal: Left psoas abscess unchanged measuring 3.2 x 3.2 cm reference image 37/2. Evaluation is limited without intravenous contrast. Fluid collections within the paraspinal musculatures seen previously have diminished after aspiration and drain placement. Indwelling pigtail drainage catheter within the left paraspinous musculature at the L3 level, with no remaining fluid collection at that location. A small residual fluid collection in the right paraspinous musculature is visualized measuring 1.9 x 0.9 cm reference image 33/2. The epidural abscess seen within the dorsal aspect of the thecal sac at the L4 level previously is less well visualized on this study without contrast, but is likely stable measuring 12 x 8 mm reference image 31/2. Erosive changes within the bilateral L3-4 and L4-5 facet joints, greatest on the left at L3-4, consistent with septic arthritis and osteomyelitis seen on  prior MRI. Reconstructed images demonstrate no additional findings. IMPRESSION: 1. Resolution of the left paraspinous intramuscular abscess after indwelling drain placement as above. 2. Marked decrease in the right paraspinous intramuscular abscess after aspiration. 3. Stable left sella S abscess. 4. Stable epidural abscess centered at the L4 level. 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 5. Small pericardial effusion, slightly increased since prior study. 6. New trace left pleural effusion. Electronically Signed   By: Sharlet Salina M.D.   On: 09/26/2022 21:03   [place] Abscess Drain  Result Date: 09/23/2022 INDICATION: Multi focal paraspinal abscesses EXAM: 1. Ultrasound-guided aspiration of right paraspinal abscess 2. Ultrasound-guided drain placement into left paraspinal abscess MEDICATIONS: Documented in the EMR ANESTHESIA/SEDATION: Local analgesia COMPLICATIONS: None immediate. PROCEDURE: Informed written consent was obtained from the patient after a thorough discussion of the procedural risks, benefits and alternatives. All questions were addressed. Maximal Sterile Barrier Technique was utilized including caps, mask, sterile gowns, sterile gloves, sterile drape, hand hygiene and skin antiseptic. A timeout was performed prior to the initiation of the procedure. Patient was placed prone on the exam table. Ultrasound was used to evaluate the subcutaneous tissues of the lower back, identifying both the right and left paraspinal collections as seen on both recent CT  and MRI imaging. Plan was made to proceed with right paraspinal fluid aspiration and left paraspinal collection drain placement. Skin entry sites were marked, and the overlying skin was prepped and draped in the standard sterile fashion. Local analgesia was obtained with 1% lidocaine. Attention was first turned to the right paraspinal collection, which was relatively smaller in size. Under  ultrasound guidance, a 19 gauge Yueh catheter was advanced directly into this collection. Subsequently, approximately 40 mL of thick green purulent material was aspirated. Postprocedure imaging demonstrated near-complete decompression of this abscess. Needle was withdrawn, and a clean dressing was placed. Attention was then turned to the left paraspinal collection, the larger of the collections. Imaging demonstrated a small channel communication between both this more superficial collection in the deeper left psoas abscess. Given this, the decision was made to attempt decompression of both the paraspinal collection in the left psoas collection via a single drain placed in the more superficial component. Under ultrasound guidance, a 12 [place] multipurpose drainage catheter was advanced into the left paraspinal collection using trocar technique. Location was confirmed with both ultrasound and return of additional thick green purulent material. Locking loop was formed, and the drainage catheter was secured to the skin using silk suture and a dressing. An additional 60 mL of thick green purulent material was aspirated. It was then attached to bulb suction. The patient tolerated all aspects of the procedure well without immediate complication. IMPRESSION: 1. Successful ultrasound-guided aspiration of right paraspinal abscess yielding 40 mL of thick green purulent material. 2. Successful ultrasound-guided placement of a 12 French locking drainage catheter into the left paraspinal collection yielding an additional 60 mL of thick green purulent material. Drainage catheter placed to bulb suction. 3. Recommend flushing of percutaneous drainage catheter x3 daily with 10 mL normal saline. 4. Given thick nature of the abscess, the deeper left psoas collection may not adequately decompress via the left paraspinal drainage catheter despite the presence of a communication between the 2 collections. Recommend close CT follow-up in  3-5 days to evaluate for interval change. If the collection remains unchanged in size, this may warrant additional CT-guided placement of a drainage catheter into the left psoas collection. Electronically Signed   By: Olive Bass M.D.   On: 09/23/2022 16:05   [place] IMAGE GUIDED FLUID DRAIN BY CATHETER  Result Date: 09/23/2022 INDICATION: Multi focal paraspinal abscesses EXAM: 1. Ultrasound-guided aspiration of right paraspinal abscess 2. Ultrasound-guided drain placement into left paraspinal abscess MEDICATIONS: Documented in the EMR ANESTHESIA/SEDATION: Local analgesia COMPLICATIONS: None immediate. PROCEDURE: Informed written consent was obtained from the patient after a thorough discussion of the procedural risks, benefits and alternatives. All questions were addressed. Maximal Sterile Barrier Technique was utilized including caps, mask, sterile gowns, sterile gloves, sterile drape, hand hygiene and skin antiseptic. A timeout was performed prior to the initiation of the procedure. Patient was placed prone on the exam table. Ultrasound was used to evaluate the subcutaneous tissues of the lower back, identifying both the right and left paraspinal collections as seen on both recent CT and MRI imaging. Plan was made to proceed with right paraspinal fluid aspiration and left paraspinal collection drain placement. Skin entry sites were marked, and the overlying skin was prepped and draped in the standard sterile fashion. Local analgesia was obtained with 1% lidocaine. Attention was first turned to the right paraspinal collection, which was relatively smaller in size. Under ultrasound guidance, a 19 gauge Yueh catheter was advanced directly into this collection. Subsequently, approximately 40 mL  of thick green purulent material was aspirated. Postprocedure imaging demonstrated near-complete decompression of this abscess. Needle was withdrawn, and a clean dressing was placed. Attention was then turned to the left  paraspinal collection, the larger of the collections. Imaging demonstrated a small channel communication between both this more superficial collection in the deeper left psoas abscess. Given this, the decision was made to attempt decompression of both the paraspinal collection in the left psoas collection via a single drain placed in the more superficial component. Under ultrasound guidance, a 12 [place] multipurpose drainage catheter was advanced into the left paraspinal collection using trocar technique. Location was confirmed with both ultrasound and return of additional thick green purulent material. Locking loop was formed, and the drainage catheter was secured to the skin using silk suture and a dressing. An additional 60 mL of thick green purulent material was aspirated. It was then attached to bulb suction. The patient tolerated all aspects of the procedure well without immediate complication. IMPRESSION: 1. Successful ultrasound-guided aspiration of right paraspinal abscess yielding 40 mL of thick green purulent material. 2. Successful ultrasound-guided placement of a 12 French locking drainage catheter into the left paraspinal collection yielding an additional 60 mL of thick green purulent material. Drainage catheter placed to bulb suction. 3. Recommend flushing of percutaneous drainage catheter x3 daily with 10 mL normal saline. 4. Given thick nature of the abscess, the deeper left psoas collection may not adequately decompress via the left paraspinal drainage catheter despite the presence of a communication between the 2 collections. Recommend close CT follow-up in 3-5 days to evaluate for interval change. If the collection remains unchanged in size, this may warrant additional CT-guided placement of a drainage catheter into the left psoas collection. Electronically Signed   By: Olive Bass M.D.   On: 09/23/2022 16:05   MR Lumbar Spine W Wo Contrast  Result Date: 09/23/2022 CLINICAL DATA:  Low back  pain, prior surgery, new symptoms h/o of epidural abscess of lumbar and thoracic spine EXAM: MRI LUMBAR SPINE WITHOUT AND WITH CONTRAST TECHNIQUE: Multiplanar and multiecho pulse sequences of the lumbar spine were obtained without and with intravenous contrast. CONTRAST:  6mL GADAVIST GADOBUTROL 1 MMOL/ML IV SOLN COMPARISON:  MRI 07/14/2021, CT 09/23/2022 FINDINGS: Segmentation:  Standard. Alignment:  Grade 1 anterolisthesis of L3 on L4 and L4 on L5. Vertebrae: Subchondral bone marrow edema associated with the left L3-L4 and L4-L5 facet joints and mild bone marrow edema within the left L3 and L4 pedicles. Subtle erosive changes noted along the posterior margins of the left L3-4 and left L4-5 facet joints. 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 Lumbar vertebral body heights are maintained without fracture. No evidence of discitis. No marrow replacing bone lesion. Conus medullaris and cauda equina: Conus extends to the L1-2 level. Conus appears normal. There is compression of the cauda equina nerve roots at the L3-4 level secondary to space-occupying epidural abscess. Large epidural abscess within the posterior epidural space spanning from the L3 level to the L5-S1 level measures 2.1 x 1.0 cm in maximal transverse dimension which is at the L4 vertebral body level (series 19, image 23) and spans a craniocaudal length of approximately 8.0 cm (series 21, image 9). This has increased in size compared to the previous MRI. Paraspinal and other soft tissues: Large posterior paraspinal intramuscular abscesses within the lumbar region measuring 8.3 x 3.2 x 4.3 cm on the right and 12.0 x 4.3 x 4.8 cm on the left. Left psoas  muscle abscess measuring 7.5 x 2.8 x 2.9 cm. Paraspinal muscle abscesses have increased since 2023. Disc levels: T12-L1: Mild disc bulge and facet hypertrophy. No foraminal or canal stenosis. L1-L2: Mild disc bulge and facet hypertrophy. No foraminal or canal  stenosis. L2-L3: Minimal disc bulge and mild-moderate facet hypertrophy. No foraminal or canal stenosis. L3-L4: Disc bulge with bilateral facet arthropathy and posterior epidural abscess. Severe canal stenosis with severe left and mild right foraminal stenosis. The epidural abscess fills the left neural foramen at this level. L4-L5: Disc bulge with severe bilateral facet arthropathy and posterior epidural abscess. Moderate canal stenosis with right greater than left subarticular recess stenosis. Mild-to-moderate bilateral foraminal stenosis. L5-S1: No disc bulge. Bilateral facet arthropathy. No foraminal or canal stenosis. IMPRESSION: 1. 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 2. Large posterior epidural abscess spanning from the L3 level to the L5-S1 level measuring up to 2.1 x 1.0 cm transaxially and spanning 8.0 cm in length. This has increased in size compared to the previous MRI. There is associated severe canal stenosis at the L3-4 disc level and L4 vertebral body level. 3. Severe left foraminal stenosis at L3-4. The epidural abscess fills the left neural foramen at this level. 4. Large posterior paraspinal intramuscular abscesses within the lumbar region measuring 8.3 x 3.2 x 4.3 cm on the right and 12.0 x 4.3 x 4.8 cm on the left. Left psoas muscle abscess measuring 7.5 x 2.8 x 2.9 cm. Paraspinal muscle abscesses have increased in size since 2023. Electronically Signed   By: Duanne Guess D.O.   On: 09/23/2022 15:08   MR THORACIC SPINE W WO CONTRAST  Result Date: 09/23/2022 CLINICAL DATA:  Mid-back pain h/o of thoracic and lumbar epidural abscess EXAM: MRI THORACIC WITHOUT AND WITH CONTRAST TECHNIQUE: Multiplanar and multiecho pulse sequences of the thoracic spine were obtained without and with intravenous contrast. CONTRAST:  6mL GADAVIST GADOBUTROL 1 MMOL/ML IV SOLN COMPARISON:  MRI 07/14/2021 FINDINGS: Alignment:   Physiologic. Vertebrae: No fracture, evidence of discitis, or bone lesion. Cord: Normal signal and morphology. Previously seen mid to lower thoracic epidural fluid collection has resolved. No residual collection or abnormal postcontrast enhancement within the thoracic canal. Paraspinal and other soft tissues: Negative. Disc levels: T1-T2: Tiny disc protrusion. Mild facet hypertrophy. No foraminal or canal stenosis. T2-T3 through T5-T6: Mild facet hypertrophy. No significant canal or neural foraminal narrowing of these levels. T6-T7: Mild disc bulge and endplate spurring on the left. No foraminal or canal stenosis. T7-T8: Mild endplate spurring without foraminal or canal stenosis. T8-T9: Minimal disc bulge and endplate spurring with mild facet hypertrophy. No foraminal or canal stenosis. T9-T10: Bilateral facet hypertrophy without foraminal or canal stenosis. T10-T11: Mild disc bulge and endplate spurring with mild facet hypertrophy. No canal stenosis. Mild left foraminal stenosis. T11-T12: Mild disc bulge and endplate spurring with bilateral facet hypertrophy. No foraminal or canal stenosis. IMPRESSION: 1. Previously seen mid to lower thoracic epidural fluid collection has resolved. No residual collection or abnormal postcontrast enhancement within the thoracic canal. 2. Mild multilevel thoracic spondylosis. Mild left foraminal stenosis at T10-T11. No canal stenosis at any level. Electronically Signed   By: Duanne Guess D.O.   On: 09/23/2022 14:54   CT ABDOMEN PELVIS W CONTRAST  Result Date: 09/23/2022 CLINICAL DATA:  groin pain, back pain. history of psoas abscess and paraspinal muscle abscess. EXAM: CT ABDOMEN AND PELVIS WITH CONTRAST TECHNIQUE: Multidetector CT imaging of the abdomen and  pelvis was performed using the standard protocol following bolus administration of intravenous contrast. RADIATION DOSE REDUCTION: This exam was performed according to the departmental dose-optimization program which  includes automated exposure control, adjustment of the mA and/or kV according to patient size and/or use of iterative reconstruction technique. CONTRAST:  OMNIPAQUE IOHEXOL 300 MG/ML  SOLN COMPARISON:  CT abdomen/pelvis 07/29/2021. FINDINGS: Lower chest: Small pericardial effusion. Within the anterior mediastinum, there is a partially imaged, peripherally enhancing lesion, possibly representing an additional site of abscess. Hepatobiliary: No focal liver abnormality is seen. No gallstones, gallbladder wall thickening, or biliary dilatation. Pancreas: Unremarkable. No pancreatic ductal dilatation or surrounding inflammatory changes. Spleen: Normal in size without focal abnormality. Adrenals/Urinary Tract: Adrenal glands are unremarkable. Kidneys are normal, without renal calculi, focal lesion, or hydronephrosis. Bladder is unremarkable. Stomach/Bowel: Stomach is within normal limits. Normal appendix is visualized on coronal image 33 series 5. No evidence of bowel wall thickening, distention, or inflammatory changes. Vascular/Lymphatic: Aortic atherosclerosis. No enlarged abdominal or pelvic lymph nodes. Reproductive: Uterus and bilateral adnexa are unremarkable. Other: No abdominal wall hernia or abnormality. No abdominopelvic ascites. Musculoskeletal: Peripherally enhancing, dorsal epidural fluid collection, measuring up to 5.0 x 1.0 x 1.3 cm (axial image 35 series 2, sagittal image 57 series 6), consistent with epidural abscess. Associated bony erosion of the left L3-4 facet joint (axial image 36 series 2), suggestive of septic arthritis. Associated large bilateral paraspinal and left psoas abscesses. The left paraspinal abscess measures up to 11.3 x 4.5 cm in the sagittal plane (image 67 series 6). The right paraspinal abscess measures up to 8.5 x 3.7 cm in the sagittal plane (image 52 series 6). The left psoas abscess measures up to 6.4 x 2.8 cm in the sagittal plane (image 69 series 6). IMPRESSION: 1.  Dorsal epidural abscess, measuring up to 5.0 x 1.0 x 1.3 cm. Associated bony erosion of the left L3-4 facet joint, suggestive of septic arthritis. 2. Associated large bilateral paraspinal and left psoas abscesses, as described above. 3. Partially imaged, peripherally enhancing lesion in the anterior mediastinum, possibly representing an additional site of abscess. Chest CT is recommended for further evaluation. 4. Small pericardial effusion. Aortic Atherosclerosis (ICD10-I70.0). Critical Value/emergent results were called by telephone at the time of interpretation on 09/23/2022 at 11:55 am to provider [facility] , who verbally acknowledged these results. Electronically Signed   By: Orvan Falconer M.D.   On: 09/23/2022 12:09    Labs:  CBC: Recent Labs    10/12/21 1007 09/23/22 1038 09/24/22 0424 09/27/22 0414  WBC 6.9 23.4* 18.0* 10.1  HGB 11.2* 12.2 10.6* 11.3*  HCT 36.2 36.8 32.0* 34.4*  PLT 349 389 332 385    COAGS: Recent Labs    09/23/22 1206 09/27/22 0919  INR 1.2 1.3*  APTT 36  --     BMP: Recent Labs    10/12/21 1007 09/23/22 1038 09/24/22 0424 09/25/22 0437  NA 140 136 134*  --   K 3.5 3.7 3.8  --   CL 106 102 103  --   CO2 26 26 24   --   GLUCOSE 70 115* 95  --   BUN 16 17 14   --   CALCIUM 9.1 8.8* 8.4*  --   CREATININE 0.60 0.58 0.56 0.50  GFRNONAA >60 >60 >60 >60    LIVER FUNCTION TESTS: Recent Labs    10/12/21 1007  BILITOT 0.5  AST 17  ALT 12  ALKPHOS 301*  PROT 7.7  ALBUMIN 3.4*  Assessment and Plan:  History of laminectomy May 2023 now with dorsal epidural abscess, large bilateral paraspinal abscesses and left psoas abscess. S/p aspiration of a right paraspinal abscess and placement of a left paraspinal abscess drain. Gram stain shows gram positive cocci in clusters with staphylococcus aureus growing on culture. WBC has decreased to within normal range. Patient is afebrile.   Unchanged size of left psoas abscess. IR will plan for aspiration  with possible drain placement today. Procedure discussed with the patient at the bedside.  Risks and benefits discussed with the patient including bleeding, infection, damage to adjacent structures, bowel perforation/fistula connection, and sepsis.  All of the patient's questions were answered, patient is agreeable to proceed. She has been NPO. Lovenox has been held.   Consent signed and in chart.   Drain Location: left back/flank Size: Fr size: 12 Fr Date of placement: 09/23/22 Currently to: Drain collection device: suction bulb 24 hour output:  Output by Drain (mL) 09/25/22 0700 - 09/25/22 1459 09/25/22 1500 - 09/25/22 2259 09/25/22 2300 - 09/26/22 0659 09/26/22 0700 - 09/26/22 1459 09/26/22 1500 - 09/26/22 2259 09/26/22 2300 - 09/27/22 0659 09/27/22 0700 - 09/27/22 0907  Closed System Drain Back Bulb (JP)         Closed System Drain Inferior;Left;Medial Back Bulb (JP)  10 5   25      Interval imaging/drain manipulation:  CT abdomen/pelvis 09/26/22 Musculoskeletal: Left psoas abscess unchanged measuring 3.2 x 3.2 cm reference image 37/2. Evaluation is limited without intravenous contrast. IMPRESSION: 1. Resolution of the left paraspinous intramuscular abscess after indwelling drain placement as above. 2. Marked decrease in the right paraspinous intramuscular abscess after aspiration. 3. Stable left sella S abscess. 4. Stable epidural abscess centered at the L4 level. 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 5. Small pericardial effusion, slightly increased since prior study. 6. New trace left pleural effusion.  Current examination: Flushes/aspirates easily.  Insertion site unremarkable. Suture and stat lock in place. Dressed appropriately.   Plan: Continue TID flushes with 5 cc NS. Record output Q shift. Dressing changes QD or PRN if soiled.  Call IR APP or on call IR MD if difficulty flushing or sudden change in  drain output.  Repeat imaging/possible drain injection once output < 10 mL/QD (excluding flush material). Consideration for drain removal if output is < 10 mL/QD (excluding flush material), pending discussion with the providing surgical service.  Discharge planning: Please contact IR APP or on call IR MD prior to patient d/c to ensure appropriate follow up plans are in place. Typically patient will follow up with IR clinic 10-14 days post d/c for repeat imaging/possible drain injection. IR scheduler will contact patient with date/time of appointment. Patient will need to flush drain QD with 5 cc NS, record output QD, dressing changes every 2-3 days or earlier if soiled.   IR will continue to follow - please call with questions or concerns.  Electronically Signed: Judea Palleschi, AGACNP-BC [phone] 09/27/2022, 10:30 AM   I spent a total of 15 Minutes at the the patient's bedside AND on the patient's hospital floor or unit, greater than 50% of which was counseling/coordinating care for left paraspinal abscess drain; pending placement of left psoas abscess drain.

## 2022-09-27 NOTE — Progress Notes (Signed)
Date of Admission:  09/23/2022    is a 67 y.o. 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   Psoas muscle abscess (HCC)   Sepsis (HCC)   Hyperlipidemia   Headache   Hyponatremia   Abscess of paraspinal muscles   Malnutrition of moderate degree    Subjective: C/o pain at the 2nd drain site which was placed today  Medications:   amLODipine  5 mg Oral Daily   enoxaparin (LOVENOX) injection  40 mg Subcutaneous Daily   feeding supplement  237 mL Oral TID BM   fentaNYL       folic acid  1 mg Oral Daily   losartan  100 mg Oral Daily   midazolam       multivitamin with minerals  1 tablet Oral Daily   rosuvastatin  10 mg Oral QHS   senna-docusate  2 tablet Oral BID   sodium chloride flush  5 mL Intracatheter Q8H   sodium chloride flush  5 mL Intracatheter Q8H    Objective: Vital signs in last 24 hours: Patient Vitals for the past 24 hrs:  BP Temp Temp src Pulse Resp SpO2  09/27/22 1559 113/73 99.1 F (37.3 C) Oral 87 19 98 %  09/27/22 1330 115/82 -- -- 69 17 95 %  09/27/22 1315 130/82 -- -- 74 18 95 %  09/27/22 1300 119/74 -- -- 77 14 95 %  09/27/22 1253 113/79 -- -- 84 14 95 %  09/27/22 1250 (!) 126/114 -- -- 94 16 97 %  09/27/22 1245 130/75 -- -- 85 17 97 %  09/27/22 1240 130/82 -- -- 82 15 99 %  09/27/22 1235 (!) 135/113 -- -- 86 19 99 %  09/27/22 1230 123/79 -- -- 78 13 99 %  09/27/22 1224 120/69 -- -- 75 18 100 %  09/27/22 1200 111/80 -- -- 72 18 95 %  09/27/22 1153 121/72 98.4 F (36.9 C) Oral 72 17 95 %  09/27/22 0518 120/81 97.9 F (36.6 C) Oral 73 -- 97 %  09/26/22 2108 122/87 98.9 F (37.2 C) Oral 86 -- 97 %    PHYSICAL EXAM:  General: Alert, cooperative, some distress, appears stated age.  Head: Normocephalic, without obvious abnormality, atraumatic. Eyes: Conjunctivae clear, anicteric sclerae. Pupils are equal ENT Nares normal.  No drainage or sinus tenderness. Lips, mucosa, and tongue normal. No Thrush Neck: Supple, symmetrical, no adenopathy, thyroid: non tender no carotid bruit and no JVD. Back: 2 drains on the lower back left side Lungs: Clear to auscultation bilaterally. No Wheezing or Rhonchi. No rales. Heart: Regular rate and rhythm, no murmur, rub or gallop. Abdomen: Soft, non-tender,not distended. Bowel sounds normal. No masses Extremities: atraumatic, no cyanosis. No edema. No clubbing Skin: No rashes or lesions. Or bruising Lymph: Cervical, supraclavicular normal. Neurologic: Grossly non-focal  Lab Results    Latest Ref Rng & Units 09/27/2022    4:14 AM 09/24/2022    4:24 AM 09/23/2022   10:38 AM  CBC  WBC 4.0 - 10.5 K/uL 10.1  18.0  23.4   Hemoglobin 12.0 - 15.0 g/dL 16.1  09.6  04.5   Hematocrit 36.0 - 46.0 % 34.4  32.0  36.8   Platelets 150 - 400 K/uL 385  332  389        Latest Ref Rng & Units 09/25/2022    4:37 AM  09/24/2022    4:24 AM 09/23/2022   10:38 AM  CMP  Glucose 70 - 99 mg/dL  95  161   BUN 8 - 23 mg/dL  14  17   Creatinine 0.96 - 1.00 mg/dL 0.45  4.09  8.11   Sodium 135 - 145 mmol/L  134  136   Potassium 3.5 - 5.1 mmol/L  3.8  3.7   Chloride 98 - 111 mmol/L  103  102   CO2 22 - 32 mmol/L  24  26   Calcium 8.9 - 10.3 mg/dL  8.4  8.8       Microbiology:  Studies/Results: CT GUIDED PERITONEAL/RETROPERITONEAL FLUID DRAIN BY PERC CATH  Result Date: 09/27/2022 INDICATION: 67 year old female with a history of psoas abscess referred for drainage EXAM: CT-GUIDED DRAINAGE OF PSOAS ABSCESS TECHNIQUE: Multidetector CT imaging of the pelvis was performed following the standard protocol without IV contrast. RADIATION DOSE REDUCTION: This exam was performed according to the departmental dose-optimization program which includes automated exposure control, adjustment of the mA and/or kV according to patient size and/or use of iterative reconstruction technique. MEDICATIONS: The patient is  currently admitted to the hospital and receiving intravenous antibiotics. The antibiotics were administered within an appropriate time frame prior to the initiation of the procedure. ANESTHESIA/SEDATION: Moderate (conscious) sedation was employed during this procedure. A total of Versed 1.0 mg and Fentanyl 50 mcg was administered intravenously by the radiology nurse. Total intra-service moderate Sedation Time: 16 minutes. The patient's level of consciousness and vital signs were monitored continuously by radiology nursing throughout the procedure under my direct supervision. COMPLICATIONS: None PROCEDURE: Informed written consent was obtained from the patient after a thorough discussion of the procedural risks, benefits and alternatives. All questions were addressed. Maximal Sterile Barrier Technique was utilized including caps, mask, sterile gowns, sterile gloves, sterile drape, hand hygiene and skin antiseptic. A timeout was performed prior to the initiation of the procedure. Patient was position prone on the CT gantry table. Scout CT acquired for planning purposes. The patient was prepped and draped in the usual sterile fashion. 1% lidocaine was used for local anesthesia. Using CT guidance, trocar needle was placed from left flank approach into the left psoas abscess. Modified Seldinger technique was then used to place a 10 [place] drain into the abscess. Approximately 30 cc of frankly purulent material aspirated. Sample was sent for culture. Drain was sutured in position attached to bulb suction. Final images were stored. Patient tolerated the procedure well and remained hemodynamically stable throughout. No complications were encountered and no significant blood loss. IMPRESSION: Status post CT-guided drainage of left psoas abscess. Signed, Yvone Neu. Miachel Roux, RPVI [facility] Electronically Signed   By: Gilmer Mor D.O.   On: 09/27/2022 13:11    CT ABDOMEN PELVIS WO CONTRAST  Result Date: 09/26/2022 CLINICAL DATA:  Abdominal pain, drain placement EXAM: CT ABDOMEN AND PELVIS WITHOUT CONTRAST TECHNIQUE: Multidetector CT imaging of the abdomen and pelvis was performed following the standard protocol without IV contrast. RADIATION DOSE REDUCTION: This exam was performed according to the departmental dose-optimization program which includes automated exposure control, adjustment of the mA and/or kV according to patient size and/or use of iterative reconstruction technique. COMPARISON:  09/23/2022 FINDINGS: Lower chest: Small pericardial effusion slightly increased since prior study. New trace left pleural effusion. Hepatobiliary: Unremarkable unenhanced appearance of the liver and gallbladder. Pancreas: Unremarkable unenhanced appearance. Spleen: Unremarkable unenhanced appearance. Adrenals/Urinary Tract: No urinary tract calculi or obstructive uropathy  within either kidney. The adrenals are stable. Bladder is unremarkable. Stomach/Bowel: No bowel obstruction or ileus. Normal appendix right lower quadrant. No bowel wall thickening or inflammatory change. Vascular/Lymphatic: Aortic atherosclerosis. No enlarged abdominal or pelvic lymph nodes. Reproductive: Uterus and bilateral adnexa are unremarkable. Other: No free fluid or free intraperitoneal gas. No abdominal wall hernia. Musculoskeletal: Left psoas abscess unchanged measuring 3.2 x 3.2 cm reference image 37/2. Evaluation is limited without intravenous contrast. Fluid collections within the paraspinal musculatures seen previously have diminished after aspiration and drain placement. Indwelling pigtail drainage catheter within the left paraspinous musculature at the L3 level, with no remaining fluid collection at that location. A small residual fluid collection in the right paraspinous musculature is visualized measuring 1.9 x 0.9 cm reference image 33/2. The epidural abscess seen within the dorsal  aspect of the thecal sac at the L4 level previously is less well visualized on this study without contrast, but is likely stable measuring 12 x 8 mm reference image 31/2. Erosive changes within the bilateral L3-4 and L4-5 facet joints, greatest on the left at L3-4, consistent with septic arthritis and osteomyelitis seen on prior MRI. Reconstructed images demonstrate no additional findings. IMPRESSION: 1. Resolution of the left paraspinous intramuscular abscess after indwelling drain placement as above. 2. Marked decrease in the right paraspinous intramuscular abscess after aspiration. 3. Stable left sella S abscess. 4. Stable epidural abscess centered at the L4 level. 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 5. Small pericardial effusion, slightly increased since prior study. 6. New trace left pleural effusion. Electronically Signed   By: Sharlet Salina M.D.   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 Patient was treated at that time with 10 weeks of antibiotics   She presented last week with back pain of 2 weeks duration and worse over the last few days   Extensive infection of the lumbar spine with bilateral paraspinal abscess, left psoas abscess, epidural abscess involving L3-L4 and L4-L5, facet joint septic arthritis on the left L3-L5 A drain was placed on the left so paraspinal abscess was drained Cultures MRSA On vancomycin  Repeat imaging showed epidural abscess- She needs that be drained by NS Need to evacuate abscess pockets so as to  reduce bioburden to make the antibiotics effective     Discussed the management with the patient,  care team

## 2022-09-27 NOTE — Procedures (Addendum)
Interventional Radiology Procedure Note  Procedure: Image guided drain placement, left psoas abscess.  41F pigtail drain.  Complications: None  EBL: None Sample: Culture sent  Recommendations: - Routine drain care, with sterile flushes, record output - follow up Cx - routine wound care - OK to advance diet per primary order when goals met - ok to restart any AC as needed  Signed,  Yvone Neu. Loreta Ave, DO

## 2022-09-27 NOTE — Progress Notes (Signed)
Progress Note   Patient: Christy Ray VHQ:[id number] DOB: 1955-10-18 DOA: 09/23/2022     4 DOS: the patient was seen and examined on 09/27/2022   Brief hospital course: Ms. Aslynn Zoltowski is a 67 year old female with history of dermatomyositis, hypertension, hyperlipidemia, who presents to the emergency department for chief concerns of low back pain. Patient had a disseminated MRSA infection in 2023, involving left great toe septic arthritis, left psoas abscess, multilevel epidural abscess.  She had a lumbar laminectomy, completed 6 weeks IV antibiotics followed by oral Bactrim.  Bactrim was discontinued due to allergy. Patient was doing well until recently when he developed significant back pain. She has significant cytosis of 23.4, CRP 11.9.  Sed rate 60.  CT scan showed dorsal epidural abscess measuring 5.0 x 1.0 x 1.3 cm with associated bony erosion of the left L3-L4 facet joint suggestive of septic arthritis. Bilateral paraspinal and left psoas abscesses.  MRI of the lumbar spine showed large posterior paraspinal intramuscular abscess on the right and on the left as well as the left psoas muscle abscess.  Compression of the cauda equina nerve roots At L3-L4 secondary to space-occupying epidural abscess. Left L3-L4 and L4-L5 facet joint bone marrow edema and subtle erosive changes noted in the posterior margin of the left L3-L4 and L3 left L4-L5 facet joints consistent with septic arthritis and osteomyelitis.   Patient is seen by neurosurgery, ID, IR performed right paraspinal abscess aspiration, left paraspinal abscess drain on 7/26.  Patient is treated with vancomycin and cefepime.  Repeated CT scan on 7/29 improved left paraspinal abscess, still present over right paraspinal abscess and epidural abscess.  IR drain is scheduled for the right 7/30.   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 Lumbar facet joint septic arthritis. Psoas muscle abscess. Sepsis [facility]) Patient met sepsis criteria with increased heart rate and leukocytosis.  She is hemodynamically stable.  She is a status post IR drain of abscess of left, aspiration of the abscess on the right psoas muscle.  Culture grew MRSA. Patient has been seen by ID and neurosurgery. Repeat CT scan yesterday showed better but persist right psoas muscle abscess, IR drain is scheduled for today. Discussed with neurosurgery and ID, still debating whether washout of epidural abscess is needed. Continue antibiotics for now with vancomycin and cefepime per ID.   dermatomyositis/polymyositis  Immunodeficient status. Patient was on CellCept previously, impaired her immune system.   Essential hypertension Resume home medicines.   Constipation. Start stool softener.      Subjective:  Patient doing well, currently has no complaint.  Physical Exam: Vitals:   09/26/22 0803 09/26/22 1651 09/26/22 2108 09/27/22 0518  BP: (!) 149/98 125/77 122/87 120/81  Pulse: 93 75 86 73  Resp: 14 16    Temp: 98.7 F (37.1 C) 98.2 F (36.8 C) 98.9 F (37.2 C) 97.9 F (36.6 C)  TempSrc: Oral Oral Oral Oral  SpO2: 97% 99% 97% 97%  Weight:      Height:       General exam: Appears calm and comfortable  Respiratory system: Clear to auscultation. Respiratory effort  normal. Cardiovascular system: S1 & S2 heard, RRR. No JVD, murmurs, rubs, gallops or clicks. No pedal edema. Gastrointestinal system: Abdomen is nondistended, soft and nontender. No organomegaly or masses felt. Normal bowel sounds heard. Central nervous system: Alert and oriented. No focal neurological  deficits. Extremities: Symmetric 5 x 5 power. Skin: No rashes, lesions or ulcers Psychiatry: Judgement and insight appear normal. Mood & affect appropriate.    Data Reviewed:  Lab results reviewed.  Family Communication: None  Disposition: Status is: Inpatient Remains inpatient appropriate because: Severity of disease, IV treatment.     Time spent: 35 minutes  Author: Marrion Coy, MD 09/27/2022 10:47 AM  For on call review [email or web address].

## 2022-09-27 NOTE — TOC Progression Note (Signed)
Transition of Care [facility]) - Progression Note    Patient Details  Name: KALY FICHTNER MRN: [id number] Date of Birth: [birth year]  Transition of Care [facility]) CM/SW Contact  Allena Katz, LCSW Phone Number: 09/27/2022, 10:19 AM  Clinical Narrative:   Per MD.It may be another week before discharge. IR drain again today, may also need epidural washout afterwards. CSW following for home infusion needs.          Expected Discharge Plan and Services                                               Social Determinants of Health (SDOH) Interventions SDOH Screenings   Food Insecurity: Patient Unable To Answer (09/23/2022)  Housing: Patient Declined (09/23/2022)  Transportation Needs: Patient Declined (09/23/2022)  Utilities: Patient Declined (09/23/2022)  Depression (PHQ2-9): Low Risk  (10/12/2021)  Financial Resource Strain: Low Risk  (02/10/2022)   Received from [facility]  Tobacco Use: Medium Risk (09/23/2022)    Readmission Risk Interventions     No data to display

## 2022-09-28 DIAGNOSIS — E871 Hypo-osmolality and hyponatremia: Secondary | ICD-10-CM

## 2022-09-28 DIAGNOSIS — K6812 Psoas muscle abscess: Secondary | ICD-10-CM | POA: Diagnosis not present

## 2022-09-28 DIAGNOSIS — A4102 Sepsis due to Methicillin resistant Staphylococcus aureus: Secondary | ICD-10-CM

## 2022-09-28 DIAGNOSIS — K219 Gastro-esophageal reflux disease without esophagitis: Secondary | ICD-10-CM | POA: Insufficient documentation

## 2022-09-28 DIAGNOSIS — E785 Hyperlipidemia, unspecified: Secondary | ICD-10-CM

## 2022-09-28 DIAGNOSIS — M3313 Other dermatomyositis without myopathy: Secondary | ICD-10-CM | POA: Diagnosis not present

## 2022-09-28 DIAGNOSIS — E440 Moderate protein-calorie malnutrition: Secondary | ICD-10-CM

## 2022-09-28 DIAGNOSIS — G062 Extradural and subdural abscess, unspecified: Secondary | ICD-10-CM | POA: Diagnosis not present

## 2022-09-28 DIAGNOSIS — I10 Essential (primary) hypertension: Secondary | ICD-10-CM

## 2022-09-28 MED ORDER — ESOMEPRAZOLE MAGNESIUM 20 MG PO CPDR: 20.0000 mg | DELAYED_RELEASE_CAPSULE | Freq: Two times a day (BID) | ORAL | Status: DC

## 2022-09-28 MED ORDER — ESOMEPRAZOLE MAGNESIUM 20 MG PO CPDR
40.0000 mg | DELAYED_RELEASE_CAPSULE | Freq: Two times a day (BID) | ORAL | Status: DC
  Administered 2022-09-28 – 2022-10-04 (×12): 40 mg via ORAL
  Filled 2022-09-28 (×14): qty 2

## 2022-09-28 MED ORDER — POTASSIUM CHLORIDE 20 MEQ PO PACK
40.0000 meq | PACK | Freq: Once | ORAL | Status: AC
  Administered 2022-09-28: 40 meq via ORAL
  Filled 2022-09-28: qty 2

## 2022-09-28 MED ORDER — ACETAMINOPHEN 325 MG PO TABS
650.0000 mg | ORAL_TABLET | Freq: Four times a day (QID) | ORAL | Status: DC | PRN
  Administered 2022-09-29 – 2022-09-30 (×2): 650 mg via ORAL
  Filled 2022-09-28 (×2): qty 2

## 2022-09-28 MED ORDER — ESOMEPRAZOLE MAGNESIUM 40 MG PO CPDR
40.0000 mg | DELAYED_RELEASE_CAPSULE | Freq: Two times a day (BID) | ORAL | Status: DC
  Filled 2022-09-28: qty 1

## 2022-09-28 MED ORDER — PANTOPRAZOLE SODIUM 40 MG PO TBEC: 40.0000 mg | DELAYED_RELEASE_TABLET | Freq: Every day | ORAL | Status: DC

## 2022-09-28 NOTE — Assessment & Plan Note (Signed)
Continue supplements

## 2022-09-28 NOTE — Plan of Care (Signed)

## 2022-09-28 NOTE — Consult Note (Addendum)
Pharmacy Antibiotic Note  Christy Ray is a 67 y.o. female admitted on 09/23/2022 with sepsis and a hx to back pain persisting for several weeks. CT of abdomen showed epidural abscess. Aspiration of R paraspinal abscess with placement of drain #1 (7/26), culture showed MRSA. Left psoas abscess drain #2 placement, culture final results pending (7/30). Pharmacy has been consulted to dose vancomycin.  Plan: Continue vancomycin 1000 mg IV Q12H based on steady-state levels  Day 6 of antibiotics Goal AUC 400-550. Peak: 24; Trough 5 (7/29) Expected AUC: 475.4 Expected Css min: 29.4 Continue to monitor renal function and culture results  Height: 5\' 5"  (165.1 cm) Weight: 59.4 kg (130 lb 15.3 oz) IBW/kg (Calculated) : 57  Temp (24hrs), Avg:98.4 F (36.9 C), Min:97.8 F (36.6 C), Max:99.1 F (37.3 C)  Recent Labs  Lab 09/23/22 1038 09/23/22 1203 09/23/22 1547 09/24/22 0424 09/25/22 0437 09/26/22 1104 09/26/22 1505 09/27/22 0414 09/28/22 0438  WBC 23.4*  --   --  18.0*  --   --   --  10.1 9.3  CREATININE 0.58  --   --  0.56 0.50  --   --   --  0.48  LATICACIDVEN  --  1.0 1.0  --   --   --   --   --   --   VANCOTROUGH  --   --   --   --   --  5*  --   --   --   VANCOPEAK  --   --   --   --   --   --  24*  --   --     Estimated Creatinine Clearance: 59.7 mL/min (by C-G formula based on SCr of 0.48 mg/dL).    Allergies  Allergen Reactions   Sulfa Antibiotics Rash    Antimicrobials this admission: Cefepime 7/26 >> 7/29 Metronidazole 7/26 >> 7/29 Vancomycin 7/26 >>   Dose adjustments this admission: 7/27 - 7/29 Vancomycin 1250 mg IV daily   Microbiology results: 7/26 BCx: NGTD 7/26 Abscess Cx: MRSA 7/30 Abscess Cx: Abundant Staph Aureus, susceptibilities to follow   Thank you for allowing pharmacy to be a part of this patient's care.  Effie Shy 09/28/2022 10:38 AM

## 2022-09-28 NOTE — Assessment & Plan Note (Signed)
Patient to bring in her own Nexium.

## 2022-09-28 NOTE — Progress Notes (Signed)
Identifier: 67 year old woman with a history of MRSA bacteremia and previous spinal abscesses who underwent previous lumbar decompression and washout.  Presenting with worsening infection.  Subjective/interval events: Patient was seen by my partner Dr. Marcell Barlow yesterday in consultation.  She was found to have worsening spinal fluid collections without neurologic compromise.  Objective: Imaging demonstrates worsening paraspinal and epidural abscesses causing lumbar stenosis.  On physical examination she has good strength in her bilateral lower extremities.  No new loss of sensation.  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   CONTRAST:  6mL GADAVIST GADOBUTROL 1 MMOL/ML IV SOLN   COMPARISON:  MRI 07/14/2021, CT 09/23/2022   FINDINGS: Segmentation:  Standard.   Alignment:  Grade 1 anterolisthesis of L3 on L4 and L4 on L5.   Vertebrae: Subchondral bone marrow edema associated with the left L3-L4 and L4-L5 facet joints and mild bone marrow edema within the left L3 and L4 pedicles. Subtle erosive changes noted along the posterior margins of the left L3-4 and left L4-5 facet joints. 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 Lumbar vertebral body heights are maintained without fracture. No evidence of discitis. No marrow replacing bone lesion.   Conus medullaris and cauda equina: Conus extends to the L1-2 level. Conus appears normal. There is compression of the cauda equina nerve roots at the L3-4 level secondary to space-occupying epidural abscess. Large epidural abscess within the posterior epidural space spanning from the L3 level to the L5-S1 level measures  2.1 x 1.0 cm in maximal transverse dimension which is at the L4 vertebral body level (series 19, image 23) and spans a craniocaudal length of approximately 8.0 cm (series 21, image 9). This has increased in size compared to the previous MRI.   Paraspinal and other soft tissues: Large posterior paraspinal intramuscular abscesses within the lumbar region measuring 8.3 x 3.2 x 4.3 cm on the right and 12.0 x 4.3 x 4.8 cm on the left. Left psoas muscle abscess measuring 7.5 x 2.8 x 2.9 cm. Paraspinal muscle abscesses have increased since 2023.   Disc levels:   T12-L1: Mild disc bulge and facet hypertrophy. No foraminal or canal stenosis.   L1-L2: Mild disc bulge and facet hypertrophy. No foraminal or canal stenosis.   L2-L3: Minimal disc bulge and mild-moderate facet hypertrophy. No foraminal or canal stenosis.   L3-L4: Disc bulge with bilateral facet arthropathy and posterior epidural abscess. Severe canal stenosis with severe left and mild right foraminal stenosis. The epidural abscess fills the left neural foramen at this level.   L4-L5: Disc bulge with severe bilateral facet arthropathy and posterior epidural abscess. Moderate canal stenosis with right greater than left subarticular recess stenosis. Mild-to-moderate bilateral foraminal stenosis.   L5-S1: No disc bulge. Bilateral facet arthropathy. No foraminal or canal stenosis.   IMPRESSION: 1. 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 2. Large posterior epidural abscess spanning from the L3 level to the L5-S1 level measuring up to 2.1 x 1.0 cm transaxially and spanning 8.0 cm in length. This has increased in size compared to the previous MRI. There is associated severe canal stenosis at the L3-4 disc level and L4 vertebral body level.  3. Severe left foraminal stenosis at L3-4. The epidural abscess fills the left neural foramen at this level. 4.  Large posterior paraspinal intramuscular abscesses within the lumbar region measuring 8.3 x 3.2 x 4.3 cm on the right and 12.0 x 4.3 x 4.8 cm on the left. Left psoas muscle abscess measuring 7.5 x 2.8 x 2.9 cm. Paraspinal muscle abscesses have increased in size since 2023.     Electronically Signed   By: Duanne Guess D.O.   On: 09/23/2022 15:08       Assessment and Recommendations:   67 year old woman with a history of MRSA bacteremia and multiple abscesses.  She has a history of previous lumbar laminectomy for epidural abscess drainage.  She presented with worsening infection and was found to have a massive paraspinal infection as well as worsening epidural fluid collection with ring enhancement as identified above.  This represents a epidural abscess causing severe spinal stenosis.  She is not noticing neurologic sequela, however given its large size, severe compression, and resistance to antibiosis, exploration, laminectomy/decompression, and paraspinal and epidural infection.  Her psoas abscess is being treated with a drain.  Please make n.p.o. at midnight.  Hold anticoagulation in preparation for the operating room morning.

## 2022-09-28 NOTE — Assessment & Plan Note (Signed)
Follow up as outpatient.  

## 2022-09-28 NOTE — Assessment & Plan Note (Addendum)
Sodium normalized.

## 2022-09-28 NOTE — Progress Notes (Signed)
Progress Note   Patient: Christy Ray ZOX:[id number] DOB: [birth year] DOA: 09/23/2022     5 DOS: the patient was seen and examined on 09/28/2022   Brief hospital course: Ms. Khyle Proud is a 67 year old female with history of dermatomyositis, hypertension, hyperlipidemia, who presents to the emergency department for chief concerns of low back pain. Patient had a disseminated MRSA infection in 2023, involving left great toe septic arthritis, left psoas abscess, multilevel epidural abscess.  She had a lumbar laminectomy, completed 6 weeks IV antibiotics followed by oral Bactrim.  Bactrim was discontinued due to allergy. Patient was doing well until recently when he developed significant back pain. She has significant cytosis of 23.4, CRP 11.9.  Sed rate 60.  CT scan showed dorsal epidural abscess measuring 5.0 x 1.0 x 1.3 cm with associated bony erosion of the left L3-L4 facet joint suggestive of septic arthritis. Bilateral paraspinal and left psoas abscesses.  MRI of the lumbar spine showed large posterior paraspinal intramuscular abscess on the right and on the left as well as the left psoas muscle abscess.  Compression of the cauda equina nerve roots At L3-L4 secondary to space-occupying epidural abscess. Left L3-L4 and L4-L5 facet joint bone marrow edema and subtle erosive changes noted in the posterior margin of the left L3-L4 and L3 left L4-L5 facet joints consistent with septic arthritis and osteomyelitis.   Patient is seen by neurosurgery, ID, IR performed right paraspinal abscess aspiration, left paraspinal abscess drain on 7/26.  Patient is treated with vancomycin and cefepime.  Repeated CT scan on 7/29 improved left paraspinal abscess, still present over right paraspinal abscess and epidural abscess.    7/30.  Left psoas muscle abscess drained on 7/30. 7/31.  Neurosurgery plans on doing a spinal procedure for epidural abscess on 8/1.  Assessment and Plan: * Epidural  abscess Neurosurgery to do procedure tomorrow to remove as much epidural abscess as possible.  Continue vancomycin.  MRSA infection.  Sepsis (HCC) Present on admission with epidural abscess and left psoas muscle abscess, leukocytosis and tachycardia.  Status post drain placements by interventional radiology.  Continue vancomycin  Psoas muscle abscess (HCC) Status post drain placement by interventional radiology on 7/30.  MRSA infection.  Dermatomyositis (HCC) Follow-up as outpatient  Hyperlipidemia On Crestor  Essential hypertension Continue Norvasc and Cozaar.  GERD (gastroesophageal reflux disease) Patient to bring in her own Nexium.  Malnutrition of moderate degree Continue supplements  Hyponatremia Improved        Subjective: Patient feeling okay.  Offers no complaints.  States she had a MRSA infection in the past also 1 year ago.  Admitted with epidural abscess and psoas muscle abscess.  Physical Exam: Vitals:   09/27/22 1559 09/27/22 2057 09/28/22 0510 09/28/22 0735  BP: 113/73 115/70 130/77 115/69  Pulse: 87 87 80 94  Resp: 19   18  Temp: 99.1 F (37.3 C) 97.8 F (36.6 C) 98.6 F (37 C) 98.1 F (36.7 C)  TempSrc: Oral Oral Oral Oral  SpO2: 98% 96% 98% 97%  Weight:      Height:       Physical Exam HENT:     Head: Normocephalic.     Mouth/Throat:     Pharynx: No oropharyngeal exudate.  Eyes:     General: Lids are normal.     Conjunctiva/sclera: Conjunctivae normal.  Cardiovascular:     Rate and Rhythm: Normal rate and regular rhythm.     Heart sounds: Normal heart sounds, S1 normal and S2 normal.  Pulmonary:     Breath sounds: No decreased breath sounds, wheezing, rhonchi or rales.  Abdominal:     Palpations: Abdomen is soft.     Tenderness: There is no abdominal tenderness.  Musculoskeletal:     Right lower leg: No swelling.     Left lower leg: No swelling.  Skin:    General: Skin is warm.     Findings: No rash.  Neurological:     Mental  Status: She is alert and oriented to person, place, and time.     Data Reviewed: MRSA growing out of cultures.  Family Communication: Permission to speak in front of visitor at bedside.  Disposition: Status is: Inpatient Remains inpatient appropriate because: Will have epidural abscess procedure tomorrow by neurosurgery.  Planned Discharge Destination: Home with Home Health    Time spent: 28 minutes  Author: Alford Highland, MD 09/28/2022 3:02 PM  For on call review [email or web address].

## 2022-09-28 NOTE — Progress Notes (Signed)
Referring Physician(s): Dr Myer Haff  Supervising Physician: Pernell Dupre  Patient Status:  [facility] - In-pt  Chief Complaint:  History of laminectomy May 2023 now with dorsal epidural abscess, large bilateral paraspinal abscesses and left psoas abscess. S/p aspiration of a right paraspinal abscess and placement of a left paraspinal abscess drain 09/23/22. S/p left psoas abscess drain placement 09/27/22  Subjective:  Patient lying in bed watching TV at time of exam. She endorses moderate pain at new drain site overnight, but states that it is much better this morning but remains slightly sore. She reports concern that new left psoas drain didn't seem to have as much output as left paraspinal drain, but was reassured when informed that 30 mL of purulent material had been aspirated during the drain placement.  Allergies: Sulfa antibiotics  Medications: Prior to Admission medications   Medication Sig Start Date End Date Taking? Authorizing Provider  acyclovir (ZOVIRAX) 400 MG tablet Take 400 mg by mouth 3 (three) times daily.  TAKE 1 TABLET (400 MG TOTAL) BY MOUTH 3 (THREE) TIMES DAILY FOR 5 DAYS AS NEEDED FOR OUTBREAK 03/24/22  Yes [provider]  amLODipine (NORVASC) 5 MG tablet Take 1 tablet by mouth daily. 08/05/22  Yes [provider]  augmented betamethasone dipropionate (DIPROLENE-AF) 0.05 % cream Apply 1 application  topically daily as needed. 08/26/22  Yes [provider]  folic acid (FOLVITE) 1 MG tablet Take 1 mg by mouth daily. 05/28/21  Yes [provider]  losartan (COZAAR) 100 MG tablet Take 1 tablet (100 mg total) by mouth daily. 07/21/21  Yes Marrion Coy, MD  meloxicam (MOBIC) 15 MG tablet Take 15 mg by mouth daily. 09/19/22  Yes [provider]  rosuvastatin (CRESTOR) 10 MG tablet Take 10 mg by mouth daily. 05/11/21  Yes [provider]  senna-docusate (SENOKOT-S) 8.6-50 MG tablet Take 2 tablets by mouth 2 (two) times  daily as needed for mild constipation. 07/20/21  Yes Marrion Coy, MD  amLODipine (NORVASC) 2.5 MG tablet Take 2.5 mg by mouth daily. Patient not taking: Reported on 09/23/2022 06/07/21   [provider]  hydroxychloroquine (PLAQUENIL) 200 MG tablet Take 200 mg by mouth daily. Patient not taking: Reported on 09/23/2022    [provider]  hydrOXYzine (ATARAX) 10 MG tablet Take 10 mg by mouth at bedtime. Patient not taking: Reported on 09/23/2022 05/17/21   [provider]  methylPREDNISolone (MEDROL DOSEPAK) 4 MG TBPK tablet Take by mouth as directed. Patient not taking: Reported on 09/23/2022 09/19/22   [provider]  tiZANidine (ZANAFLEX) 2 MG tablet Take 2 mg by mouth 2 (two) times daily as needed. Patient not taking: Reported on 09/23/2022 09/09/22   [provider]     Vital Signs: BP 115/69 (BP Location: Left Arm)   Pulse 94   Temp 98.1 F (36.7 C) (Oral)   Resp 18   Ht 5\' 5"  (1.651 m)   Wt 130 lb 15.3 oz (59.4 kg)   SpO2 97%   BMI 21.79 kg/m   Physical Exam Vitals reviewed.  Pulmonary:     Effort: Pulmonary effort is normal.  Musculoskeletal:     Comments: Left paraspinal drain (more superior drain) with small amount of serosanguineous fluid. Dressing clean, dry, intact. Suture and stat lock intact. Flushes/aspirates easily Left psoas drain (more inferior drain) with small amount of serosanguineous fluid. Dressing clean, dry, intact. Suture and stat lock intact. Flushes/aspirates easily  Skin:    General: Skin is warm  and dry.  Neurological:     Mental Status: She is alert and oriented to person, place, and time.  Psychiatric:        Mood and Affect: Mood normal.        Behavior: Behavior normal.        Thought Content: Thought content normal.        Judgment: Judgment normal.     Imaging: CT GUIDED PERITONEAL/RETROPERITONEAL FLUID DRAIN BY PERC CATH  Result Date: 09/27/2022 INDICATION: 67 year old female with a history of  psoas abscess referred for drainage EXAM: CT-GUIDED DRAINAGE OF PSOAS ABSCESS TECHNIQUE: Multidetector CT imaging of the pelvis was performed following the standard protocol without IV contrast. RADIATION DOSE REDUCTION: This exam was performed according to the departmental dose-optimization program which includes automated exposure control, adjustment of the mA and/or kV according to patient size and/or use of iterative reconstruction technique. MEDICATIONS: The patient is currently admitted to the hospital and receiving intravenous antibiotics. The antibiotics were administered within an appropriate time frame prior to the initiation of the procedure. ANESTHESIA/SEDATION: Moderate (conscious) sedation was employed during this procedure. A total of Versed 1.0 mg and Fentanyl 50 mcg was administered intravenously by the radiology nurse. Total intra-service moderate Sedation Time: 16 minutes. The patient's level of consciousness and vital signs were monitored continuously by radiology nursing throughout the procedure under my direct supervision. COMPLICATIONS: None PROCEDURE: Informed written consent was obtained from the patient after a thorough discussion of the procedural risks, benefits and alternatives. All questions were addressed. Maximal Sterile Barrier Technique was utilized including caps, mask, sterile gowns, sterile gloves, sterile drape, hand hygiene and skin antiseptic. A timeout was performed prior to the initiation of the procedure. Patient was position prone on the CT gantry table. Scout CT acquired for planning purposes. The patient was prepped and draped in the usual sterile fashion. 1% lidocaine was used for local anesthesia. Using CT guidance, trocar needle was placed from left flank approach into the left psoas abscess. Modified Seldinger technique was then used to place a 10 [place] drain into the abscess. Approximately 30 cc of frankly purulent material aspirated. Sample was sent for culture.  Drain was sutured in position attached to bulb suction. Final images were stored. Patient tolerated the procedure well and remained hemodynamically stable throughout. No complications were encountered and no significant blood loss. IMPRESSION: Status post CT-guided drainage of left psoas abscess. Signed, Yvone Neu. Miachel Roux, RPVI [facility] Radiology Electronically Signed   By: Gilmer Mor D.O.   On: 09/27/2022 13:11   CT ABDOMEN PELVIS WO CONTRAST  Result Date: 09/26/2022 CLINICAL DATA:  Abdominal pain, drain placement EXAM: CT ABDOMEN AND PELVIS WITHOUT CONTRAST TECHNIQUE: Multidetector CT imaging of the abdomen and pelvis was performed following the standard protocol without IV contrast. RADIATION DOSE REDUCTION: This exam was performed according to the departmental dose-optimization program which includes automated exposure control, adjustment of the mA and/or kV according to patient size and/or use of iterative reconstruction technique. COMPARISON:  09/23/2022 FINDINGS: Lower chest: Small pericardial effusion slightly increased since prior study. New trace left pleural effusion. Hepatobiliary: Unremarkable unenhanced appearance of the liver and gallbladder. Pancreas: Unremarkable unenhanced appearance. Spleen: Unremarkable unenhanced appearance. Adrenals/Urinary Tract: No urinary tract calculi or obstructive uropathy within either kidney. The adrenals are stable. Bladder is unremarkable. Stomach/Bowel: No bowel obstruction or ileus. Normal appendix right lower quadrant. No bowel wall thickening or inflammatory change. Vascular/Lymphatic: Aortic atherosclerosis. No enlarged abdominal or pelvic lymph nodes. Reproductive:  Uterus and bilateral adnexa are unremarkable. Other: No free fluid or free intraperitoneal gas. No abdominal wall hernia. Musculoskeletal: Left psoas abscess unchanged measuring 3.2 x 3.2 cm reference image 37/2. Evaluation is limited  without intravenous contrast. Fluid collections within the paraspinal musculatures seen previously have diminished after aspiration and drain placement. Indwelling pigtail drainage catheter within the left paraspinous musculature at the L3 level, with no remaining fluid collection at that location. A small residual fluid collection in the right paraspinous musculature is visualized measuring 1.9 x 0.9 cm reference image 33/2. The epidural abscess seen within the dorsal aspect of the thecal sac at the L4 level previously is less well visualized on this study without contrast, but is likely stable measuring 12 x 8 mm reference image 31/2. Erosive changes within the bilateral L3-4 and L4-5 facet joints, greatest on the left at L3-4, consistent with septic arthritis and osteomyelitis seen on prior MRI. Reconstructed images demonstrate no additional findings. IMPRESSION: 1. Resolution of the left paraspinous intramuscular abscess after indwelling drain placement as above. 2. Marked decrease in the right paraspinous intramuscular abscess after aspiration. 3. Stable left sella S abscess. 4. Stable epidural abscess centered at the L4 level. 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 5. Small pericardial effusion, slightly increased since prior study. 6. New trace left pleural effusion. Electronically Signed   By: Sharlet Salina M.D.   On: 09/26/2022 21:03    Labs:  CBC: Recent Labs    09/23/22 1038 09/24/22 0424 09/27/22 0414 09/28/22 0438  WBC 23.4* 18.0* 10.1 9.3  HGB 12.2 10.6* 11.3* 9.9*  HCT 36.8 32.0* 34.4* 30.1*  PLT 389 332 385 346    COAGS: Recent Labs    09/23/22 1206 09/27/22 0919  INR 1.2 1.3*  APTT 36  --     BMP: Recent Labs    10/12/21 1007 09/23/22 1038 09/24/22 0424 09/25/22 0437 09/28/22 0438  NA 140 136 134*  --  134*  K 3.5 3.7 3.8  --  3.3*  CL 106 102 103  --  104  CO2 26 26 24   --  24  GLUCOSE 70 115* 95   --  83  BUN 16 17 14   --  11  CALCIUM 9.1 8.8* 8.4*  --  8.0*  CREATININE 0.60 0.58 0.56 0.50 0.48  GFRNONAA >60 >60 >60 >60 >60    LIVER FUNCTION TESTS: Recent Labs    10/12/21 1007  BILITOT 0.5  AST 17  ALT 12  ALKPHOS 301*  PROT 7.7  ALBUMIN 3.4*    Assessment and Plan:  History of laminectomy May 2023 now with dorsal epidural abscess, large bilateral paraspinal abscesses and left psoas abscess. S/p aspiration of a right paraspinal abscess and placement of a left paraspinal abscess drain 09/23/22. Gram stain shows gram positive cocci in clusters with methicillin resistant staphylococcus aureus growing on culture. WBC has decreased to within normal range. Patient is afebrile.   S/p left psoas abscess drain placement 09/27/22. Gram stain with few Gram positive cocci, culture pending.  Drain Location: Left inferior back x2 (paraspinal drain is superior to psoas drain) Size: 12 Fr (paraspinal), 10 Fr (psoas) Date of placement: 09/23/22; 09/27/22 Currently to: Drain collection device: suction bulb 24 hour output:  Output by Drain (mL) 09/26/22 0701 - 09/26/22 1900 09/26/22 1901 - 09/27/22 0700 09/27/22 0701 - 09/27/22 1900 09/27/22 1901 - 09/28/22 0700 09/28/22 0701 - 09/28/22 1054  Closed System Drain Inferior;Left;Medial Back  Bulb (JP)  25 25 15    Closed System Drain 2 Inferior;Lateral;Left Flank Bulb (JP)   5 20     Interval imaging/drain manipulation:  CT AP w/o Contrast 09/26/22 Left psoas abscess drain placement 09/27/22  Current examination: Flushes/aspirates easily.  Insertion site unremarkable. Suture and stat lock in place. Dressed appropriately.   Plan: Continue TID flushes with 5 cc NS. Record output Q shift. Dressing changes QD or PRN if soiled.  Call IR APP or on call IR MD if difficulty flushing or sudden change in drain output.  Repeat imaging/possible drain injection once output < 10 mL/QD (excluding flush material). Consideration for drain removal if output  is < 10 mL/QD (excluding flush material), pending discussion with the providing surgical service.  Discharge planning: Please contact IR APP or on call IR MD prior to patient d/c to ensure appropriate follow up plans are in place. Typically patient will follow up with IR clinic 10-14 days post d/c for repeat imaging/possible drain injection. IR scheduler will contact patient with date/time of appointment. Patient will need to flush drain QD with 5 cc NS, record output QD, dressing changes every 2-3 days or earlier if soiled.   IR will continue to follow - please call with questions or concerns.    Electronically Signed: Kennieth Francois, PA-C 09/28/2022, 9:39 AM   I spent a total of 15 Minutes at the the patient's bedside AND on the patient's hospital floor or unit, greater than 50% of which was counseling/coordinating care for left paraspinal abscess, left psoas abscess.

## 2022-09-29 ENCOUNTER — Inpatient Hospital Stay: Payer: Medicare Other

## 2022-09-29 ENCOUNTER — Inpatient Hospital Stay: Payer: Medicare Other | Admitting: Anesthesiology

## 2022-09-29 ENCOUNTER — Encounter: Payer: Self-pay | Admitting: Internal Medicine

## 2022-09-29 ENCOUNTER — Other Ambulatory Visit: Payer: Self-pay

## 2022-09-29 ENCOUNTER — Encounter
Admission: EM | Disposition: A | Discharge status: Home Health | Payer: Self-pay | Source: Home / Self Care | Attending: Internal Medicine

## 2022-09-29 DIAGNOSIS — A4102 Sepsis due to Methicillin resistant Staphylococcus aureus: Secondary | ICD-10-CM | POA: Diagnosis not present

## 2022-09-29 DIAGNOSIS — G062 Extradural and subdural abscess, unspecified: Secondary | ICD-10-CM | POA: Diagnosis not present

## 2022-09-29 DIAGNOSIS — E785 Hyperlipidemia, unspecified: Secondary | ICD-10-CM | POA: Diagnosis not present

## 2022-09-29 DIAGNOSIS — K6812 Psoas muscle abscess: Secondary | ICD-10-CM | POA: Diagnosis not present

## 2022-09-29 DIAGNOSIS — B9562 Methicillin resistant Staphylococcus aureus infection as the cause of diseases classified elsewhere: Secondary | ICD-10-CM | POA: Diagnosis not present

## 2022-09-29 DIAGNOSIS — M6008 Infective myositis, other site: Secondary | ICD-10-CM | POA: Diagnosis not present

## 2022-09-29 DIAGNOSIS — E876 Hypokalemia: Secondary | ICD-10-CM

## 2022-09-29 DIAGNOSIS — D638 Anemia in other chronic diseases classified elsewhere: Secondary | ICD-10-CM | POA: Insufficient documentation

## 2022-09-29 HISTORY — PX: LUMBAR LAMINECTOMY FOR EPIDURAL ABSCESS: SHX5956

## 2022-09-29 LAB — CREATININE, SERUM
Creatinine, Ser: 0.61 mg/dL (ref 0.44–1.00)
GFR, Estimated: >60 mL/min (ref >60)

## 2022-09-29 LAB — VANCOMYCIN, TROUGH: Vancomycin Tr: 12 ug/mL — ABNORMAL LOW (ref 15–20)

## 2022-09-29 SURGERY — LUMBAR LAMINECTOMY FOR EPIDURAL ABSCESS
Anesthesia: General | Site: Spine Lumbar

## 2022-09-29 MED ORDER — PHENYLEPHRINE 80 MCG/ML (10ML) SYRINGE FOR IV PUSH (FOR BLOOD PRESSURE SUPPORT)
PREFILLED_SYRINGE | INTRAVENOUS | Status: DC | PRN
  Administered 2022-09-29 (×2): 160 ug via INTRAVENOUS
  Administered 2022-09-29: 80 ug via INTRAVENOUS
  Administered 2022-09-29: 160 ug via INTRAVENOUS
  Administered 2022-09-29: 80 ug via INTRAVENOUS
  Administered 2022-09-29: 160 ug via INTRAVENOUS
  Administered 2022-09-29: 80 ug via INTRAVENOUS
  Administered 2022-09-29 (×3): 160 ug via INTRAVENOUS
  Administered 2022-09-29: 80 ug via INTRAVENOUS

## 2022-09-29 MED ORDER — SODIUM CHLORIDE FLUSH 0.9 % IV SOLN
INTRAVENOUS | Status: AC
  Filled 2022-09-29: qty 20

## 2022-09-29 MED ORDER — SUGAMMADEX SODIUM 200 MG/2ML IV SOLN
INTRAVENOUS | Status: DC | PRN
  Administered 2022-09-29: 120 mg via INTRAVENOUS

## 2022-09-29 MED ORDER — VANCOMYCIN HCL 1000 MG IV SOLR
INTRAVENOUS | Status: AC
  Filled 2022-09-29: qty 20

## 2022-09-29 MED ORDER — BUPIVACAINE-EPINEPHRINE (PF) 0.5% -1:200000 IJ SOLN
INTRAMUSCULAR | Status: AC
  Filled 2022-09-29: qty 10

## 2022-09-29 MED ORDER — OXYCODONE HCL 5 MG/5ML PO SOLN: 5.0000 mg | Freq: Once | ORAL | Status: DC | PRN

## 2022-09-29 MED ORDER — SODIUM CHLORIDE 0.9% FLUSH: 3.0000 mL | INTRAVENOUS | Status: DC | PRN

## 2022-09-29 MED ORDER — 0.9 % SODIUM CHLORIDE (POUR BTL) OPTIME
TOPICAL | Status: DC | PRN
  Administered 2022-09-29: 3000 mL
  Administered 2022-09-29: 500 mL

## 2022-09-29 MED ORDER — LIDOCAINE HCL (CARDIAC) PF 100 MG/5ML IV SOSY
PREFILLED_SYRINGE | INTRAVENOUS | Status: DC | PRN
  Administered 2022-09-29: 50 mg via INTRAVENOUS

## 2022-09-29 MED ORDER — ONDANSETRON HCL 4 MG/2ML IJ SOLN: 4.0000 mg | Freq: Once | INTRAMUSCULAR | Status: DC | PRN

## 2022-09-29 MED ORDER — LACTATED RINGERS IV SOLN: INTRAVENOUS | Status: DC

## 2022-09-29 MED ORDER — PROPOFOL 1000 MG/100ML IV EMUL
INTRAVENOUS | Status: AC
  Filled 2022-09-29: qty 100

## 2022-09-29 MED ORDER — DEXAMETHASONE SODIUM PHOSPHATE 10 MG/ML IJ SOLN
INTRAMUSCULAR | Status: AC
  Filled 2022-09-29: qty 1

## 2022-09-29 MED ORDER — DEXAMETHASONE SODIUM PHOSPHATE 10 MG/ML IJ SOLN
INTRAMUSCULAR | Status: DC | PRN
  Administered 2022-09-29: 10 mg via INTRAVENOUS

## 2022-09-29 MED ORDER — MIDAZOLAM HCL 2 MG/2ML IJ SOLN
INTRAMUSCULAR | Status: AC
  Filled 2022-09-29: qty 2

## 2022-09-29 MED ORDER — OXYCODONE HCL 5 MG PO TABS: 5.0000 mg | ORAL_TABLET | Freq: Once | ORAL | Status: DC | PRN

## 2022-09-29 MED ORDER — METHYLPREDNISOLONE ACETATE 40 MG/ML IJ SUSP
INTRAMUSCULAR | Status: AC
  Filled 2022-09-29: qty 1

## 2022-09-29 MED ORDER — FENTANYL CITRATE (PF) 100 MCG/2ML IJ SOLN: 25.0000 ug | INTRAMUSCULAR | Status: DC | PRN

## 2022-09-29 MED ORDER — BUPIVACAINE-EPINEPHRINE (PF) 0.5% -1:200000 IJ SOLN
INTRAMUSCULAR | Status: DC | PRN
  Administered 2022-09-29: 10 mL via PERINEURAL

## 2022-09-29 MED ORDER — ACETAMINOPHEN 10 MG/ML IV SOLN: 1000.0000 mg | Freq: Once | INTRAVENOUS | Status: DC | PRN

## 2022-09-29 MED ORDER — SODIUM CHLORIDE 0.9% FLUSH: 3.0000 mL | Freq: Two times a day (BID) | INTRAVENOUS | Status: DC

## 2022-09-29 MED ORDER — KETOROLAC TROMETHAMINE 30 MG/ML IJ SOLN
INTRAMUSCULAR | Status: DC | PRN
  Administered 2022-09-29: 30 mg via INTRAVENOUS

## 2022-09-29 MED ORDER — MIDAZOLAM HCL 2 MG/2ML IJ SOLN
INTRAMUSCULAR | Status: DC | PRN
  Administered 2022-09-29: 2 mg via INTRAVENOUS

## 2022-09-29 MED ORDER — ROCURONIUM BROMIDE 10 MG/ML (PF) SYRINGE
PREFILLED_SYRINGE | INTRAVENOUS | Status: AC
  Filled 2022-09-29: qty 10

## 2022-09-29 MED ORDER — PHENYLEPHRINE 80 MCG/ML (10ML) SYRINGE FOR IV PUSH (FOR BLOOD PRESSURE SUPPORT)
PREFILLED_SYRINGE | INTRAVENOUS | Status: AC
  Filled 2022-09-29: qty 10

## 2022-09-29 MED ORDER — SODIUM CHLORIDE 0.9 % IV SOLN: 250.0000 mL | INTRAVENOUS | Status: DC

## 2022-09-29 MED ORDER — PROPOFOL 10 MG/ML IV BOLUS
INTRAVENOUS | Status: AC
  Filled 2022-09-29: qty 20

## 2022-09-29 MED ORDER — PROPOFOL 10 MG/ML IV BOLUS
INTRAVENOUS | Status: DC | PRN
Start: 2022-09-29 — End: 2022-09-29
  Administered 2022-09-29: 100 mg via INTRAVENOUS

## 2022-09-29 MED ORDER — ONDANSETRON HCL 4 MG/2ML IJ SOLN
INTRAMUSCULAR | Status: DC | PRN
  Administered 2022-09-29: 4 mg via INTRAVENOUS

## 2022-09-29 MED ORDER — FENTANYL CITRATE (PF) 100 MCG/2ML IJ SOLN
INTRAMUSCULAR | Status: AC
  Filled 2022-09-29: qty 2

## 2022-09-29 MED ORDER — LACTATED RINGERS IV SOLN: INTRAVENOUS | Status: DC | PRN

## 2022-09-29 MED ORDER — KETOROLAC TROMETHAMINE 30 MG/ML IJ SOLN
INTRAMUSCULAR | Status: AC
  Filled 2022-09-29: qty 1

## 2022-09-29 MED ORDER — PROPOFOL 500 MG/50ML IV EMUL
INTRAVENOUS | Status: DC | PRN
  Administered 2022-09-29: 80 ug/kg/min via INTRAVENOUS

## 2022-09-29 MED ORDER — ONDANSETRON HCL 4 MG/2ML IJ SOLN
INTRAMUSCULAR | Status: AC
  Filled 2022-09-29: qty 2

## 2022-09-29 MED ORDER — ENOXAPARIN SODIUM 40 MG/0.4ML IJ SOSY
40.0000 mg | PREFILLED_SYRINGE | INTRAMUSCULAR | Status: DC
  Filled 2022-09-29 (×3): qty 0.4

## 2022-09-29 MED ORDER — FENTANYL CITRATE (PF) 100 MCG/2ML IJ SOLN
INTRAMUSCULAR | Status: DC | PRN
  Administered 2022-09-29 (×2): 50 ug via INTRAVENOUS

## 2022-09-29 MED ORDER — BUPIVACAINE LIPOSOME 1.3 % IJ SUSP
INTRAMUSCULAR | Status: AC
  Filled 2022-09-29: qty 20

## 2022-09-29 MED ORDER — ROCURONIUM BROMIDE 100 MG/10ML IV SOLN
INTRAVENOUS | Status: DC | PRN
  Administered 2022-09-29: 40 mg via INTRAVENOUS

## 2022-09-29 MED ORDER — SURGIPHOR WOUND IRRIGATION SYSTEM - OPTIME
TOPICAL | Status: DC | PRN
  Administered 2022-09-29: 450 mL via TOPICAL

## 2022-09-29 MED ORDER — ACETAMINOPHEN 10 MG/ML IV SOLN
INTRAVENOUS | Status: AC
  Filled 2022-09-29: qty 100

## 2022-09-29 SURGICAL SUPPLY — 58 items
ADH SKN CLS APL DERMABOND .7 (GAUZE/BANDAGES/DRESSINGS) ×1
BASIN KIT SINGLE STR (MISCELLANEOUS) ×1 IMPLANT
BUR NEURO DRILL SOFT 3.0X3.8M (BURR) ×1 IMPLANT
CATH LUMBAR HERMETIC 14G (CATHETERS) IMPLANT
CATHETER LUMBAR HERMETIC 14G (CATHETERS)
CNTNR URN SCR LID CUP LEK RST (MISCELLANEOUS) ×1 IMPLANT
CONT SPEC 4OZ STRL OR WHT (MISCELLANEOUS)
DERMABOND ADVANCED .7 DNX12 (GAUZE/BANDAGES/DRESSINGS) ×1 IMPLANT
DRAPE C ARM PK CFD 31 SPINE (DRAPES) IMPLANT
DRAPE C-ARM 42X72 X-RAY (DRAPES) IMPLANT
DRAPE LAPAROTOMY 100X77 ABD (DRAPES) ×1 IMPLANT
DRAPE MICROSCOPE SPINE 48X150 (DRAPES) ×1 IMPLANT
DRSG OPSITE POSTOP 4X6 (GAUZE/BANDAGES/DRESSINGS) IMPLANT
DRSG TEGADERM 4X4.75 (GAUZE/BANDAGES/DRESSINGS) IMPLANT
ELECT CAUTERY BLADE TIP 2.5 (TIP) ×1
ELECT EZSTD 165MM 6.5IN (MISCELLANEOUS)
ELECT REM PT RETURN 9FT ADLT (ELECTROSURGICAL) ×1
ELECTRODE CAUTERY BLDE TIP 2.5 (TIP) ×2 IMPLANT
ELECTRODE EZSTD 165MM 6.5IN (MISCELLANEOUS) IMPLANT
ELECTRODE REM PT RTRN 9FT ADLT (ELECTROSURGICAL) ×1 IMPLANT
FORCEPS BIPOLAR SPETZLER 8 1.0 (NEUROSURGERY SUPPLIES) IMPLANT
FORCEPS SPETZLER BAYONT 1.0X18 (FORCEP) IMPLANT
GLOVE BIOGEL PI IND STRL 6.5 (GLOVE) ×1 IMPLANT
GLOVE SURG SYN 6.5 ES PF (GLOVE) ×2 IMPLANT
GLOVE SURG SYN 6.5 PF PI (GLOVE) ×2 IMPLANT
GLOVE SURG SYN 8.5 E (GLOVE) ×3 IMPLANT
GLOVE SURG SYN 8.5 PF PI (GLOVE) ×3 IMPLANT
GOWN SRG LRG LVL 4 IMPRV REINF (GOWNS) ×1 IMPLANT
GOWN SRG XL LVL 3 NONREINFORCE (GOWNS) ×1 IMPLANT
GOWN STRL NON-REIN TWL XL LVL3 (GOWNS) ×1
GOWN STRL REIN LRG LVL4 (GOWNS) ×1
HEMOVAC 400CC 10FR (MISCELLANEOUS) IMPLANT
IV CATH ANGIO 14GX1.88 NO SAFE (IV SOLUTION) IMPLANT
KIT DRAIN CSF ACCUDRAIN (MISCELLANEOUS) IMPLANT
KIT SPINAL PRONEVIEW (KITS) ×1 IMPLANT
MANIFOLD NEPTUNE II (INSTRUMENTS) ×1 IMPLANT
MARKER SKIN DUAL TIP RULER LAB (MISCELLANEOUS) ×2 IMPLANT
NDL SAFETY ECLIP 18X1.5 (MISCELLANEOUS) ×1 IMPLANT
NS IRRIG 1000ML POUR BTL (IV SOLUTION) ×1 IMPLANT
NS IRRIG 500ML POUR BTL (IV SOLUTION) IMPLANT
PACK LAMINECTOMY ARMC (PACKS) ×1 IMPLANT
PAD ARMBOARD 7.5X6 YLW CONV (MISCELLANEOUS) ×1 IMPLANT
SOLUTION IRRIG SURGIPHOR (IV SOLUTION) ×1 IMPLANT
STAPLER SKIN PROX 35W (STAPLE) ×1 IMPLANT
SUT DVC VLOC 3-0 CL 6 P-12 (SUTURE) IMPLANT
SUT ETHILON 3-0 FS-10 30 BLK (SUTURE) ×1
SUT GORETEX 6.0 TT9 (SUTURE) IMPLANT
SUT NURALON 4 0 TR CR/8 (SUTURE) IMPLANT
SUT VIC AB 0 CT1 27 (SUTURE) ×1
SUT VIC AB 0 CT1 27XCR 8 STRN (SUTURE) ×1 IMPLANT
SUT VIC AB 2-0 CT1 18 (SUTURE) ×1 IMPLANT
SUTURE EHLN 3-0 FS-10 30 BLK (SUTURE) IMPLANT
SYR 10ML LL (SYRINGE) ×2 IMPLANT
SYR 30ML LL (SYRINGE) ×2 IMPLANT
SYR 3ML LL SCALE MARK (SYRINGE) ×1 IMPLANT
TOWEL OR 17X26 4PK STRL BLUE (TOWEL DISPOSABLE) ×2 IMPLANT
TRAP FLUID SMOKE EVACUATOR (MISCELLANEOUS) ×1 IMPLANT
WATER STERILE IRR 500ML POUR (IV SOLUTION) ×1 IMPLANT

## 2022-09-29 NOTE — Assessment & Plan Note (Addendum)
Last hemoglobin 8.0.  Continue to monitor as outpatient.

## 2022-09-29 NOTE — Assessment & Plan Note (Signed)
Replaced. °

## 2022-09-29 NOTE — Anesthesia Procedure Notes (Signed)
Procedure Name: Intubation Date/Time: 09/29/2022 9:40 AM  Performed by: Omer Jack, CRNAPre-anesthesia Checklist: Patient identified, Patient being monitored, Timeout performed, Emergency Drugs available and Suction available Patient Re-evaluated:Patient Re-evaluated prior to induction Oxygen Delivery Method: Circle system utilized Preoxygenation: Pre-oxygenation with 100% oxygen Induction Type: IV induction Ventilation: Mask ventilation without difficulty Laryngoscope Size: 3 and McGraph Grade View: Grade I Tube type: Oral Tube size: 6.5 mm Number of attempts: 1 Airway Equipment and Method: Stylet Placement Confirmation: ETT inserted through vocal cords under direct vision, positive ETCO2 and breath sounds checked- equal and bilateral Secured at: 21 cm Tube secured with: Tape Dental Injury: Teeth and Oropharynx as per pre-operative assessment

## 2022-09-29 NOTE — Consult Note (Signed)
Pharmacy Antibiotic Note  Christy Ray is a 67 y.o. female admitted on 09/23/2022 with sepsis and a hx to back pain persisting for several weeks. CT of abdomen showed epidural abscess. Aspiration of R paraspinal abscess with placement of drain #1 (7/26), culture showed MRSA. Left psoas abscess drain #2 placement, culture final results pending (7/30). Pharmacy has been consulted to dose vancomycin.  Plan: Continue vancomycin 1000 mg IV Q12H based on trough of 12 (8/1) and ID pharmacist Day 7 of antibiotics Goal AUC 400-550. Peak: 24; Trough 5 (7/29) Expected AUC: 475.4 Expected Css min: 29.4 Continue to monitor renal function and culture results  Height: 5\' 5"  (165.1 cm) Weight: 59.4 kg (130 lb 15.3 oz) IBW/kg (Calculated) : 57  Temp (24hrs), Avg:98 F (36.7 C), Min:97.4 F (36.3 C), Max:98.5 F (36.9 C)  Recent Labs  Lab 09/23/22 1038 09/23/22 1203 09/23/22 1547 09/24/22 0424 09/25/22 0437 09/26/22 1104 09/26/22 1505 09/27/22 0414 09/28/22 0438 09/29/22 1312  WBC 23.4*  --   --  18.0*  --   --   --  10.1 9.3  --   CREATININE 0.58  --   --  0.56 0.50  --   --   --  0.48 0.61  LATICACIDVEN  --  1.0 1.0  --   --   --   --   --   --   --   VANCOTROUGH  --   --   --   --   --  5*  --   --   --  12*  VANCOPEAK  --   --   --   --   --   --  24*  --   --   --     Estimated Creatinine Clearance: 59.7 mL/min (by C-G formula based on SCr of 0.61 mg/dL).    Allergies  Allergen Reactions   Sulfa Antibiotics Rash    Antimicrobials this admission: Cefepime 7/26 >> 7/29 Metronidazole 7/26 >> 7/29 Vancomycin 7/26 >>   Dose adjustments this admission: 7/27 - 7/29 Vancomycin 1250 mg IV daily   Microbiology results: 7/26 BCx: NGTD 7/26 Abscess Cx: MRSA 7/30 Abscess Cx: Abundant Staph Aureus, susceptibilities to follow   Thank you for allowing pharmacy to be a part of this patient's care.  Effie Shy 09/29/2022 4:19 PM

## 2022-09-29 NOTE — Transfer of Care (Signed)
Immediate Anesthesia Transfer of Care Note  Patient: Christy Ray  Procedure(s) Performed: LUMBAR LAMINECTOMY FOR EPIDURAL ABSCESS (Spine Lumbar)  Patient Location: PACU  Anesthesia Type:General  Level of Consciousness: drowsy and patient cooperative  Airway & Oxygen Therapy: Patient Spontanous Breathing  Post-op Assessment: Report given to RN and Post -op Vital signs reviewed and stable  Post vital signs: Reviewed and stable  Last Vitals:  Vitals Value Taken Time  BP 110/78 09/29/22 1122  Temp 36.6 C 09/29/22 1119  Pulse 84 09/29/22 1122  Resp 15 09/29/22 1122  SpO2 100 % 09/29/22 1122  Vitals shown include unfiled device data.  Last Pain:  Vitals:   09/29/22 0840  TempSrc: Temporal  PainSc: 0-No pain         Complications: No notable events documented.

## 2022-09-29 NOTE — Interval H&P Note (Signed)
History and Physical Interval Note:  09/29/2022 8:56 AM  Christy Ray  has presented today for surgery, with the diagnosis of epidural abscess.  The various methods of treatment have been discussed with the patient and family. After consideration of risks, benefits and other options for treatment, the patient has consented to  Procedure(s): LUMBAR LAMINECTOMY FOR EPIDURAL ABSCESS (N/A) as a surgical intervention.  The patient's history has been reviewed, patient examined, no change in status, stable for surgery.  I have reviewed the patient's chart and labs.  Questions were answered to the patient's satisfaction.     Loreen Freud

## 2022-09-29 NOTE — Anesthesia Postprocedure Evaluation (Signed)
Anesthesia Post Note  Patient: Christy Ray  Procedure(s) Performed: LUMBAR LAMINECTOMY FOR EPIDURAL ABSCESS (Spine Lumbar)  Patient location during evaluation: PACU Anesthesia Type: General Level of consciousness: awake and alert, oriented and patient cooperative Pain management: pain level controlled Vital Signs Assessment: post-procedure vital signs reviewed and stable Respiratory status: spontaneous breathing, nonlabored ventilation and respiratory function stable Cardiovascular status: blood pressure returned to baseline and stable Postop Assessment: adequate PO intake Anesthetic complications: no   No notable events documented.   Last Vitals:  Vitals:   09/29/22 1130 09/29/22 1132  BP: (!) 96/51 112/63  Pulse: 78 86  Resp: 15 19  Temp:  36.8 C  SpO2: 100% 98%    Last Pain:  Vitals:   09/29/22 1132  TempSrc:   PainSc: 0-No pain                 [name]

## 2022-09-29 NOTE — Progress Notes (Signed)
Progress Note   Patient: Christy Ray ZOX:[id number] DOB: 03-18-1955 DOA: 09/23/2022     6 DOS: the patient was seen and examined on 09/29/2022   Brief hospital course: Ms. Marielle Klenke is a 67 year old female with history of dermatomyositis, hypertension, hyperlipidemia, who presents to the emergency department for chief concerns of low back pain. Patient had a disseminated MRSA infection in 2023, involving left great toe septic arthritis, left psoas abscess, multilevel epidural abscess.  She had a lumbar laminectomy, completed 6 weeks IV antibiotics followed by oral Bactrim.  Bactrim was discontinued due to allergy. Patient was doing well until recently when he developed significant back pain. She has significant cytosis of 23.4, CRP 11.9.  Sed rate 60.  CT scan showed dorsal epidural abscess measuring 5.0 x 1.0 x 1.3 cm with associated bony erosion of the left L3-L4 facet joint suggestive of septic arthritis. Bilateral paraspinal and left psoas abscesses.  MRI of the lumbar spine showed large posterior paraspinal intramuscular abscess on the right and on the left as well as the left psoas muscle abscess.  Compression of the cauda equina nerve roots At L3-L4 secondary to space-occupying epidural abscess. Left L3-L4 and L4-L5 facet joint bone marrow edema and subtle erosive changes noted in the posterior margin of the left L3-L4 and L3 left L4-L5 facet joints consistent with septic arthritis and osteomyelitis.   Patient is seen by neurosurgery, ID, IR performed right paraspinal abscess aspiration, left paraspinal abscess drain on 7/26.  Patient is treated with vancomycin and cefepime.  Repeated CT scan on 7/29 improved left paraspinal abscess, still present over right paraspinal abscess and epidural abscess.    7/30.  Left psoas muscle abscess drained on 7/30. 7/31.  Neurosurgery plans on doing a spinal procedure for epidural abscess on 8/1. 8/1.  Neurosurgical procedure done for epidural  abscess today and drain placement.  Assessment and Plan: * Epidural abscess Neurosurgical procedure today on 8/1 for epidural abscess removal and washout.  Continue vancomycin.  MRSA infection.  Sepsis (HCC) MRSA infection.  Present on admission with epidural abscess and left psoas muscle abscess, leukocytosis and tachycardia.  Status post drain placements by interventional radiology.  Continue vancomycin  Psoas muscle abscess (HCC) Status post drain placement by interventional radiology on 7/30.  MRSA infection.  Hyperlipidemia On Crestor  Dermatomyositis (HCC) Follow-up as outpatient  Essential hypertension Continue Norvasc and Cozaar.  Anemia of chronic disease Last hemoglobin 9.9  GERD (gastroesophageal reflux disease) Patient to bring in her own Nexium.  Malnutrition of moderate degree Continue supplements  Hyponatremia Last sodium 134  Hypokalemia Replaced        Subjective: Patient was seen this morning prior to neurosurgical procedure.  Offers no complaints.  Feeling okay.  Admitted with MRSA infection.  Physical Exam: Vitals:   09/29/22 1119 09/29/22 1122 09/29/22 1130 09/29/22 1132  BP:  110/78 (!) 96/51 112/63  Pulse:   78 86  Resp:  14 15 19   Temp: 97.9 F (36.6 C)   98.2 F (36.8 C)  TempSrc:      SpO2:  100% 100% 98%  Weight:      Height:       Physical Exam HENT:     Head: Normocephalic.     Mouth/Throat:     Pharynx: No oropharyngeal exudate.  Eyes:     General: Lids are normal.     Conjunctiva/sclera: Conjunctivae normal.  Cardiovascular:     Rate and Rhythm: Normal rate and regular rhythm.  Heart sounds: Normal heart sounds, S1 normal and S2 normal.  Pulmonary:     Breath sounds: No decreased breath sounds, wheezing, rhonchi or rales.  Abdominal:     Palpations: Abdomen is soft.     Tenderness: There is no abdominal tenderness.  Musculoskeletal:     Right lower leg: No swelling.     Left lower leg: No swelling.  Skin:     General: Skin is warm.     Findings: No rash.  Neurological:     Mental Status: She is alert and oriented to person, place, and time.     Data Reviewed: Creatinine 0.61, hemoglobin 9.9  Disposition: Status is: Inpatient Remains inpatient appropriate because: Had neurosurgical procedure today for epidural abscess.  Continue IV antibiotics.  Planned Discharge Destination: Home with Home Health    Time spent: 28 minutes  Author: Alford Highland, MD 09/29/2022 2:47 PM  For on call review [email or web address].

## 2022-09-29 NOTE — Op Note (Signed)
Indications:  Epidural abscess, Lumbar Abscess of paraspinal muscles  Findings: Right-sided paraspinal abscess, left-sided paraspinal abscess well decompressed from drain.  Epidural phlegmon plus liquid abscess noted.  Preoperative Diagnosis: Lumbar epidural spinal abscess Postoperative Diagnosis: same   EBL: 50 ml IVF: see anesthesia record Drains: Subfascial Hemovac Disposition: Extubated and Stable to PACU Complications: none  No foley catheter was placed.   Preoperative Note: 67 year old woman with a history of extensive MRSA infection, had a previous lumbar Lami for epidural abscess approximately 1.5 years ago.  She presented again with worsening control of her MRSA infection.  She had multiple paraspinal abscesses as well as a large compressive epidural abscess as well as psoas abscess.  Neurosurgery was consulted due to the compression, given the complete effacement of the CSF space, we planned for decompression, evacuation, and washout of her prior wound.  Risks of surgery discussed include: infection, bleeding, stroke, coma, death, paralysis, CSF leak, nerve/spinal cord injury, numbness, tingling, weakness, complex regional pain syndrome, recurrent stenosis and/or disc herniation, vascular injury, development of instability, neck/back pain, need for further surgery, persistent symptoms, development of deformity, and the risks of anesthesia. They understood these risks and have agreed to proceed.  Operative Note:    The patient was then brought from the preoperative center with intravenous access established.  The patient underwent general anesthesia and endotracheal tube intubation, then was rotated on the [facility] table where all pressure points were appropriately padded.  An incision was marked with flouroscopy. The skin was then thoroughly cleansed.  Perioperative antibiotic prophylaxis was administered.  Sterile prep and drapes were then applied and a timeout was then observed.     Once this was complete a 6 cm incision was opened with the use of a #10 blade knife.  The paraspinus muscled on the L4 spinous process were subperiosteally dissected until the facet was visualized.  We were able to identify the left-sided paraspinal drain which had well decompressed to the abscess pocket.  On the right side we are able to identify a paraspinal fluid collection and were able to wash this out and debride this as well.  We then utilized flouroscopy to confirm the level. A self-retaining retractor was placed.  Under loupe visualization, the posterior elements of L4 were identified, as were the superior elements of L5 and the inferior elements of L 3.  Using a Leksell rongeur we remove the spinous process of L4 in its entirety, we also remove the inferior aspect of the L3 spinous process.  We brought this down to the level of the lamina.  At this point we did notice some sublaminar liquid abscess which was left to drain.  We then brought in the high-speed drill and from an inferior to superior orientation.  We able to expose the ligamentum flavum at the L4 level.  We continued this superiorly until we encountered the inferior lamina of L3.  Small portion of this was taken to release some of the attachment of the ligamentum flavum.  Once the ligamentum flavum was opened and partially resected liquid abscess was expressed.  We then remove the ligamentum flavum and found epidural phlegmon.  We were able to resect some of this but did not do an extensive amount as to violate the dura.  After the decompression was done and there was no more liquid abscess available, we utilized a pulse lavage to irrigate approximately 1.8 L.  We then used a antibiotic solution for more extensive irrigation.  After the hemostasis was obtained,  we utilize vancomycin powder within the wound.  We placed a subfascial drain.  It was fixed into place.  We then closed the wound in multiple layers.  Staples were used on the  superficial layer.  The drain was fixed into place with a nylon stitch.  The wound was dressed with a sterile dressing.  I performed the entire procedure with the assistance of Manning Charity PA as an Designer, television/film set, they helped with soft tissue retraction, visualization, exposure and closure.  I performed the critical portions of the case.   Lovenia Kim, MD

## 2022-09-29 NOTE — Anesthesia Preprocedure Evaluation (Signed)
Anesthesia Evaluation  Patient identified by MRN, date of birth, ID band Patient awake    Reviewed: Allergy & Precautions, NPO status , Patient's Chart, lab work & pertinent test results  History of Anesthesia Complications Negative for: history of anesthetic complications  Airway Mallampati: III   Neck ROM: Full    Dental  (+) Missing   Pulmonary Current Smoker (1/2 ppd) and Patient abstained from smoking.   Pulmonary exam normal breath sounds clear to auscultation       Cardiovascular hypertension, Normal cardiovascular exam Rhythm:Regular Rate:Normal  Echo 01/21/20:  NORMAL LEFT VENTRICULAR SYSTOLIC FUNCTION  NORMAL LA PRESSURES WITH NORMAL DIASTOLIC FUNCTION  MILD RV SYSTOLIC DYSFUNCTION  VALVULAR REGURGITATION: MILD AR, TRIVIAL MR, TRIVIAL PR, TRIVIAL TR  NO VALVULAR STENOSIS  TRIVIAL PERICARDIAL EFFUSION  INSUFFICIENT TR TO ESTIMATE RVSP  Compared with prior Echo study on 02/13/2019: NO SIGNIFICANT CHANGES.    Neuro/Psych negative neurological ROS     GI/Hepatic ,GERD  ,,  Endo/Other  negative endocrine ROS    Renal/GU negative Renal ROS     Musculoskeletal  (+) Arthritis ,  Gout; left foot cellulitis   Abdominal   Peds  Hematology negative hematology ROS (+)   Anesthesia Other Findings   Reproductive/Obstetrics                             Anesthesia Physical Anesthesia Plan  ASA: 2  Anesthesia Plan: General   Post-op Pain Management:    Induction: Intravenous  PONV Risk Score and Plan: 2 and Ondansetron, Dexamethasone and Treatment may vary due to age or medical condition  Airway Management Planned: Oral ETT  Additional Equipment:   Intra-op Plan:   Post-operative Plan: Extubation in OR  Informed Consent: I have reviewed the patients History and Physical, chart, labs and discussed the procedure including the risks, benefits and alternatives for  the proposed anesthesia with the patient or authorized representative who has indicated his/her understanding and acceptance.     Dental advisory given  Plan Discussed with: CRNA  Anesthesia Plan Comments: (Patient consented for risks of anesthesia including but not limited to:  - adverse reactions to medications - damage to eyes, teeth, lips or other oral mucosa - nerve damage due to positioning  - sore throat or hoarseness - damage to heart, brain, nerves, lungs, other parts of body or loss of life  Informed patient about role of CRNA in peri- and intra-operative care.  Patient voiced understanding.)        Anesthesia Quick Evaluation

## 2022-09-29 NOTE — Progress Notes (Signed)
Date of Admission:  09/23/2022    is a 67 y.o. female  Principal Problem:   Epidural abscess Active Problems:   Gout   Hypokalemia   Essential hypertension   Dermatomyositis (HCC)   Psoas muscle abscess (HCC)   Sepsis (HCC)   Hyperlipidemia   Headache   Hyponatremia   Abscess of paraspinal muscles   Malnutrition of moderate degree   MRSA infection   GERD (gastroesophageal reflux disease)   Anemia of chronic disease    Subjective: Doing better Underwent surgery today  Medications:   amLODipine  5 mg Oral Daily   [START ON 09/30/2022] enoxaparin (LOVENOX) injection  40 mg Subcutaneous Q24H   esomeprazole  40 mg Oral BID   feeding supplement  237 mL Oral TID BM   folic acid  1 mg Oral Daily   losartan  100 mg Oral Daily   multivitamin with minerals  1 tablet Oral Daily   rosuvastatin  10 mg Oral QHS   senna-docusate  2 tablet Oral BID   sodium chloride flush  5 mL Intracatheter Q8H   sodium chloride flush  5 mL Intracatheter Q8H    Objective: Vital signs in last 24 hours: Patient Vitals for the past 24 hrs:  BP Temp Temp src Pulse Resp SpO2  09/29/22 1649 95/63 97.7 F (36.5 C) Oral 86 16 99 %  09/29/22 1132 112/63 98.2 F (36.8 C) -- 86 19 98 %  09/29/22 1130 (!) 96/51 -- -- 78 15 100 %  09/29/22 1122 110/78 -- -- -- 14 100 %  09/29/22 1119 -- 97.9 F (36.6 C) -- -- -- --  09/29/22 0840 114/68 97.7 F (36.5 C) Temporal 74 17 99 %  09/29/22 0817 119/68 (!) 97.4 F (36.3 C) Oral 78 18 98 %  09/29/22 0555 113/71 98.5 F (36.9 C) Oral 71 18 98 %  09/28/22 1958 104/75 98.3 F (36.8 C) Oral 79 19 96 %    PHYSICAL EXAM:  General: Alert, cooperative, some distress, appears stated age.  Head: Normocephalic, without obvious abnormality, atraumatic. Eyes: Conjunctivae clear, anicteric sclerae. Pupils are equal ENT Nares normal. No drainage or sinus tenderness. Lips, mucosa, and tongue normal. No Thrush Neck: Supple, symmetrical, no adenopathy, thyroid: non  tender no carotid bruit and no JVD. Back: 2 drains on the lower back left side Has a vac drain post surgery Surgical dressing Lungs: b/l air entry. Heart: Regular rate and rhythm, no murmur, rub or gallop. Abdomen: Soft, non-tender,not distended. Bowel sounds normal. No masses Extremities: atraumatic, no cyanosis. No edema. No clubbing Skin: No rashes or lesions. Or bruising Lymph: Cervical, supraclavicular normal. Neurologic: Grossly non-focal  Lab Results    Latest Ref Rng & Units 09/28/2022    4:38 AM 09/27/2022    4:14 AM 09/24/2022    4:24 AM  CBC  WBC 4.0 - 10.5 K/uL 9.3  10.1  18.0   Hemoglobin 12.0 - 15.0 g/dL 9.9  16.1  09.6   Hematocrit 36.0 - 46.0 % 30.1  34.4  32.0   Platelets 150 - 400 K/uL 346  385  332        Latest Ref Rng & Units 09/29/2022    1:12 PM 09/28/2022    4:38 AM 09/25/2022    4:37 AM  CMP  Glucose 70 - 99 mg/dL  83    BUN 8 - 23 mg/dL  11    Creatinine 0.45 - 1.00 mg/dL 4.09  8.11  9.14   Sodium 135 -  145 mmol/L  134    Potassium 3.5 - 5.1 mmol/L  3.3    Chloride 98 - 111 mmol/L  104    CO2 22 - 32 mmol/L  24    Calcium 8.9 - 10.3 mg/dL  8.0        Microbiology: MRSA in 3 cultures Studies/Results: DG Lumbar Spine 2-3 Views  Result Date: 09/29/2022 CLINICAL DATA:  [id number] Surgery, elective [id number] EXAM: LUMBAR SPINE - 2-3 VIEW COMPARISON:  September 23, 2022 FINDINGS: Spot fluoroscopy image was obtained for surgical planning purposes. This demonstrates placement of metallic hardware overlying the approximate L3-4 level and L5 vertebral body level. Surgical drains. Time: 2 seconds Dose: 0.68 mGy Please reference procedure report for further details. IMPRESSION: Spot fluoroscopy image for surgical planning purposes. Electronically Signed   By: Meda Klinefelter M.D.   On: 09/29/2022 14:21   DG C-Arm 1-60 Min-No Report  Result Date: 09/29/2022 Fluoroscopy was utilized by the requesting physician.  No radiographic interpretation.            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 Patient was treated at that time with 10 weeks of antibiotics   She presented last week with back pain of 2 weeks duration and worse over the last few days   MRSA infection Extensive infection of the lumbar spine with bilateral paraspinal abscess, left psoas abscess, epidural abscess involving L3-L4 and L4-L5, facet joint septic arthritis on the left L3-L5 Left  paraspinal drain Left psoas drain   epidural abscess/phlegmon Underwent laminectomy l3-l4  and washout- also had wash out of the rt paraspinal abscess today ( 09/29/22)  Continue vancomycin q 12 Will need for 6-8 weeks of IV  followed by PO antibiotic for a long time   Discussed the management with the patient,  care team

## 2022-09-29 NOTE — Plan of Care (Signed)
  Problem: Fluid Volume: Goal: Hemodynamic stability will improve Outcome: Progressing   Problem: Clinical Measurements: Goal: Diagnostic test results will improve Outcome: Progressing Goal: Signs and symptoms of infection will decrease Outcome: Progressing   Problem: Respiratory: Goal: Ability to maintain adequate ventilation will improve Outcome: Progressing   Problem: Education: Goal: Knowledge of General Education information will improve Description: Including pain rating scale, medication(s)/side effects and non-pharmacologic comfort measures Outcome: Progressing   Problem: Health Behavior/Discharge Planning: Goal: Ability to manage health-related needs will improve Outcome: Progressing   Problem: Clinical Measurements: Goal: Ability to maintain clinical measurements within normal limits will improve Outcome: Progressing Goal: Will remain free from infection Outcome: Progressing Goal: Diagnostic test results will improve Outcome: Progressing Goal: Respiratory complications will improve Outcome: Progressing Goal: Cardiovascular complication will be avoided Outcome: Progressing   Problem: Activity: Goal: Risk for activity intolerance will decrease Outcome: Progressing   Problem: Nutrition: Goal: Adequate nutrition will be maintained Outcome: Progressing   Problem: Elimination: Goal: Will not experience complications related to bowel motility Outcome: Progressing Goal: Will not experience complications related to urinary retention Outcome: Progressing   Problem: Pain Managment: Goal: General experience of comfort will improve Outcome: Progressing

## 2022-09-30 ENCOUNTER — Encounter: Payer: Self-pay | Admitting: Neurosurgery

## 2022-09-30 DIAGNOSIS — M6008 Infective myositis, other site: Secondary | ICD-10-CM | POA: Diagnosis not present

## 2022-09-30 DIAGNOSIS — G062 Extradural and subdural abscess, unspecified: Secondary | ICD-10-CM | POA: Diagnosis not present

## 2022-09-30 DIAGNOSIS — A4902 Methicillin resistant Staphylococcus aureus infection, unspecified site: Secondary | ICD-10-CM

## 2022-09-30 DIAGNOSIS — B9562 Methicillin resistant Staphylococcus aureus infection as the cause of diseases classified elsewhere: Secondary | ICD-10-CM | POA: Diagnosis not present

## 2022-09-30 DIAGNOSIS — I10 Essential (primary) hypertension: Secondary | ICD-10-CM | POA: Diagnosis not present

## 2022-09-30 DIAGNOSIS — K6812 Psoas muscle abscess: Secondary | ICD-10-CM | POA: Diagnosis not present

## 2022-09-30 DIAGNOSIS — D638 Anemia in other chronic diseases classified elsewhere: Secondary | ICD-10-CM | POA: Diagnosis not present

## 2022-09-30 MED ORDER — POLYETHYLENE GLYCOL 3350 17 G PO PACK
17.0000 g | PACK | Freq: Every day | ORAL | Status: DC | PRN
  Administered 2022-09-30 – 2022-10-02 (×2): 17 g via ORAL
  Filled 2022-09-30 (×5): qty 1

## 2022-09-30 NOTE — Evaluation (Signed)
Physical Therapy Evaluation Patient Details Name: Christy Ray MRN: [id number] DOB: [birth year] Today's Date: 09/30/2022  History of Present Illness  Pt is a 67 y.o. female presenting to hospital 09/23/22 with c/o back pain and groin pain.  Pt admitted with epidural abscess, sepsis, psoas muscle abscess and HA.  S/p aspiration of a right paraspinal abscess and placement of a left paraspinal abscess drain 7/26.  S/p left psoas abscess drain placement 09/27/22.  Neurosurgical procedure 8/1 for epidural abscess removal and washout.  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  Clinical Impression  Prior to hospital admission, pt was independent with functional mobility; lives with her husband in 1 level home with 5 STE with B railings.  No c/o pain during session.  Reviewed spinal precautions (pt able to verbalize and maintain spinal precautions appropriately during session).  Currently pt is modified independent with bed mobility; independent with transfers; independent with ambulation 240 feet (no AD use); and modified independent navigating steps.  Pt appears to be close to baseline level of functional mobility; no acute PT needs identified; PT will sign off (discussed with pt who was in agreement and reports no PT related concerns).    If plan is discharge home, recommend the following: Assistance with cooking/housework   Can travel by private vehicle    Yes    Equipment Recommendations None recommended by PT  Recommendations for Other Services       Functional Status Assessment Patient has not had a recent decline in their functional status     Precautions / Restrictions Precautions Precautions: Back Precaution Booklet Issued:  (Reviewed spinal precautions) Precaution Comments: Per order: No  brace needed; 3 drains Restrictions Weight Bearing Restrictions: No      Mobility  Bed Mobility Overal bed mobility: Modified Independent             General bed mobility comments: Semi-supine to/from sitting without any noted difficulties (via logrolling)    Transfers Overall transfer level: Independent Equipment used: None               General transfer comment: steady safe transfer from bed    Ambulation/Gait Ambulation/Gait assistance: Independent Gait Distance (Feet):  (240) Assistive device: None Gait Pattern/deviations: WFL(Within Functional Limits)       General Gait Details: steady ambulation  Stairs Stairs: Yes Stairs assistance: Modified independent (Device/Increase time) Stair Management: One rail Right Number of Stairs: 10 General stair comments: steady stairs navigation  Wheelchair Mobility     Tilt Bed    Modified Rankin (Stroke Patients Only)       Balance Overall balance assessment: Independent Sitting-balance support: No upper extremity supported, Feet supported Sitting balance-Leahy Scale: Normal     Standing balance support: No upper extremity supported, During functional activity Standing balance-Leahy Scale: Normal Standing balance comment: no loss of balance noted during standing/ambulation activities                             Pertinent Vitals/Pain Pain Assessment Pain Assessment: No/denies pain HR 117-124 bpm during session (nurse notified).    Home Living Family/patient expects to be discharged to:: Private residence Living Arrangements: Spouse/significant other Available Help at Discharge: Family;Available PRN/intermittently Type of Home: House Home Access: Stairs to enter Entrance Stairs-Rails: Right;Left;Can reach both Entrance [employer]  of Steps: 5   Home Layout: One level Home Equipment: Cane - quad      Prior Function Prior Level of Function : Independent/Modified Independent              Mobility Comments: No recent falls reported.       Hand Dominance        Extremity/Trunk Assessment   Upper Extremity Assessment Upper Extremity Assessment: Overall WFL for tasks assessed;Defer to OT evaluation    Lower Extremity Assessment Lower Extremity Assessment: Overall WFL for tasks assessed    Cervical / Trunk Assessment Cervical / Trunk Assessment: Normal  Communication   Communication: No difficulties  Cognition Arousal/Alertness: Awake/alert Behavior During Therapy: WFL for tasks assessed/performed Overall Cognitive Status: Within Functional Limits for tasks assessed                                          General Comments General comments (skin integrity, edema, etc.): mild drainage noted back honeycomb dressing; 3 drains intact.  Nursing cleared pt for participation in physical therapy.  Pt agreeable to PT session.  Pt's husband present end of session.    Exercises     Assessment/Plan    PT Assessment Patient does not need any further PT services  PT Problem List         PT Treatment Interventions      PT Goals (Current goals can be found in the Care Plan section)  Acute Rehab PT Goals Patient Stated Goal: to go home PT Goal Formulation: With patient Time For Goal Achievement: 10/14/22 Potential to Achieve Goals: Good    Frequency       Co-evaluation               AM-PAC PT "6 Clicks" Mobility  Outcome Measure Help needed turning from your back to your side while in a flat bed without using bedrails?: None Help needed moving from lying on your back to sitting on the side of a flat bed without using bedrails?: None Help needed moving to and from a bed to a chair (including a wheelchair)?: None Help needed standing up from a chair using your arms (e.g., wheelchair or bedside chair)?: None Help needed to walk in hospital room?: None Help needed climbing 3-5 steps with a railing? : None 6 Click Score:  24    End of Session   Activity Tolerance: Patient tolerated treatment well Patient left: in bed;with call bell/phone within reach;with family/visitor present Nurse Communication: Mobility status;Precautions PT Visit Diagnosis: Other abnormalities of gait and mobility (R26.89)    Time: 5284-1324 PT Time Calculation (min) (ACUTE ONLY): 12 min   Charges:   PT Evaluation $PT Eval Low Complexity: 1 Low   PT General Charges $$ ACUTE PT VISIT: 1 Visit        Hendricks Limes, PT 09/30/22, 2:17 PM

## 2022-09-30 NOTE — Evaluation (Signed)
Occupational Therapy Evaluation Patient Details Name: Christy Ray MRN: [id number] DOB: [birth year] Today's Date: 09/30/2022   History of Present Illness Pt is a 67 y.o. female presenting to hospital 09/23/22 with c/o back pain and groin pain.  Pt admitted with epidural abscess, sepsis, psoas muscle abscess and HA.  S/p aspiration of a right paraspinal abscess and placement of a left paraspinal abscess drain 7/26.  S/p left psoas abscess drain placement 09/27/22.  Neurosurgical procedure 8/1 for epidural abscess removal and washout.  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   Clinical Impression   Christy Ray reports she is having no pain today. She is able to perform bed mobility, transfers, grooming in standing, ambulation w/o AD and with no LOB, toileting, all w/ Mod I. Pt was IND PTA. She appears to be close to her baseline level of fxl mobility at present. Pt has good safety awareness and good balance. Pt, her husband, and therapist all in agreement that no further OT services are required at this time.    Recommendations for follow up therapy are one component of a multi-disciplinary discharge planning process, led by the attending physician.  Recommendations may be updated based on patient status, additional functional criteria and insurance authorization.   Assistance Recommended at Discharge PRN  Patient can return home with the following Assist for transportation    Functional Status Assessment  Patient has had a recent decline in their functional status and demonstrates the ability to make significant improvements in function in a reasonable and predictable amount of time.  Equipment Recommendations  None recommended by OT    Recommendations for Other Services       Precautions /  Restrictions Precautions Precautions: Back Precaution Booklet Issued:  (Reviewed spinal precautions) Precaution Comments: Per order: No brace needed; 3 drains Restrictions Weight Bearing Restrictions: No      Mobility Bed Mobility Overal bed mobility: Modified Independent                  Transfers Overall transfer level: Independent Equipment used: None               General transfer comment: steady safe transfer from bed and toilet      Balance Overall balance assessment: Independent Sitting-balance support: No upper extremity supported, Feet supported Sitting balance-Leahy Scale: Normal     Standing balance support: No upper extremity supported, During functional activity Standing balance-Leahy Scale: Normal Standing balance comment: no loss of balance noted during standing/ambulation activities                           ADL either performed or assessed with clinical judgement   ADL Overall ADL's : Modified independent                                       General ADL Comments: IND-Mod I in dressing, grooming, toileting     Vision         Perception     Praxis      Pertinent Vitals/Pain Pain Assessment Pain Assessment: No/denies pain     Hand Dominance     Extremity/Trunk Assessment Upper Extremity Assessment Upper Extremity Assessment: Overall WFL for tasks assessed  Lower Extremity Assessment Lower Extremity Assessment: Overall WFL for tasks assessed   Cervical / Trunk Assessment Cervical / Trunk Assessment: Normal   Communication Communication Communication: No difficulties   Cognition Arousal/Alertness: Awake/alert Behavior During Therapy: WFL for tasks assessed/performed Overall Cognitive Status: Within Functional Limits for tasks assessed                                       General Comments  mild drainage noted back honeycomb dressing; 3 drains intact    Exercises Other  Exercises Other Exercises: Educ re: home safety, exercise plan, safe return to driving   Shoulder Instructions      Home Living Family/patient expects to be discharged to:: Private residence Living Arrangements: Spouse/significant other Available Help at Discharge: Family;Available PRN/intermittently Type of Home: House Home Access: Stairs to enter [employer] of Steps: 5 Entrance Stairs-Rails: Right;Left;Can reach both Home Layout: One level     Bathroom Shower/Tub: Chief Strategy Officer: Standard     Home Equipment: [employer] - quad   Additional Comments: Pt has QC at home, although ambulates w/o AD      Prior Functioning/Environment Prior Level of Function : Independent/Modified Independent             Mobility Comments: No recent falls, no AD. ADLs Comments: IND in B/IADL.        OT Problem List: Decreased activity tolerance      OT Treatment/Interventions:      OT Goals(Current goals can be found in the care plan section)    OT Frequency:      Co-evaluation              AM-PAC OT "6 Clicks" Daily Activity     Outcome Measure Help from another person eating meals?: None Help from another person taking care of personal grooming?: None Help from another person toileting, which includes using toliet, bedpan, or urinal?: None Help from another person bathing (including washing, rinsing, drying)?: None Help from another person to put on and taking off regular upper body clothing?: None Help from another person to put on and taking off regular lower body clothing?: None 6 Click Score: 24   End of Session    Activity Tolerance: Patient tolerated treatment well Patient left: in bed;with call bell/phone within reach;with family/visitor present  OT Visit Diagnosis: Muscle weakness (generalized) (M62.81)                Time: 1100-1118 OT Time Calculation (min): 18 min Charges:  OT General Charges $OT Visit: 1 Visit OT  Evaluation $OT Eval Low Complexity: 1 Low Latina Craver, PhD, MS, OTR/L 09/30/22, 3:42 PM

## 2022-09-30 NOTE — Progress Notes (Signed)
Referring Physician(s): Dr Myer Haff  Supervising Physician: Pernell Dupre  Patient Status:  [facility] - In-pt  Chief Complaint:  History of laminectomy May 2023 now with dorsal epidural abscess, large bilateral paraspinal abscesses and left psoas abscess. S/p aspiration of a right paraspinal abscess and placement of a left paraspinal abscess drain 09/23/22. S/p left psoas abscess drain placement 09/27/22  Subjective:  Patient lying in bed sleeping upon presentation to room. She states she is mildly sore from her surgery yesterday but otherwise ok.  Allergies: Sulfa antibiotics  Medications: Prior to Admission medications   Medication Sig Start Date End Date Taking? Authorizing Provider  acyclovir (ZOVIRAX) 400 MG tablet Take 400 mg by mouth 3 (three) times daily.  TAKE 1 TABLET (400 MG TOTAL) BY MOUTH 3 (THREE) TIMES DAILY FOR 5 DAYS AS NEEDED FOR OUTBREAK 03/24/22  Yes [provider]  amLODipine (NORVASC) 5 MG tablet Take 1 tablet by mouth daily. 08/05/22  Yes [provider]  augmented betamethasone dipropionate (DIPROLENE-AF) 0.05 % cream Apply 1 application  topically daily as needed. 08/26/22  Yes [provider]  folic acid (FOLVITE) 1 MG tablet Take 1 mg by mouth daily. 05/28/21  Yes [provider]  losartan (COZAAR) 100 MG tablet Take 1 tablet (100 mg total) by mouth daily. 07/21/21  Yes Marrion Coy, MD  meloxicam (MOBIC) 15 MG tablet Take 15 mg by mouth daily. 09/19/22  Yes [provider]  rosuvastatin (CRESTOR) 10 MG tablet Take 10 mg by mouth daily. 05/11/21  Yes [provider]  senna-docusate (SENOKOT-S) 8.6-50 MG tablet Take 2 tablets by mouth 2 (two) times daily as needed for mild constipation. 07/20/21  Yes Marrion Coy, MD  amLODipine (NORVASC) 2.5 MG tablet Take 2.5 mg by mouth daily. Patient not taking: Reported on 09/23/2022 06/07/21   [provider]  hydroxychloroquine (PLAQUENIL) 200 MG tablet Take 200 mg  by mouth daily. Patient not taking: Reported on 09/23/2022    [provider]  hydrOXYzine (ATARAX) 10 MG tablet Take 10 mg by mouth at bedtime. Patient not taking: Reported on 09/23/2022 05/17/21   [provider]  methylPREDNISolone (MEDROL DOSEPAK) 4 MG TBPK tablet Take by mouth as directed. Patient not taking: Reported on 09/23/2022 09/19/22   [provider]  tiZANidine (ZANAFLEX) 2 MG tablet Take 2 mg by mouth 2 (two) times daily as needed. Patient not taking: Reported on 09/23/2022 09/09/22   [provider]     Vital Signs: BP 126/75 (BP Location: Left Arm)   Pulse 84   Temp 98 F (36.7 C) (Oral)   Resp 18   Ht 5\' 5"  (1.651 m)   Wt 130 lb 15.3 oz (59.4 kg)   SpO2 99%   BMI 21.79 kg/m   Physical Exam Vitals reviewed.  Pulmonary:     Effort: Pulmonary effort is normal.  Musculoskeletal:     Comments: Left paraspinal drain (more superior drain) with small amount of serous fluid. Dressing clean, dry, intact. Suture and stat lock intact. Flushes/aspirates easily Left psoas drain (more inferior drain) with small amount of serosanguineous fluid. Dressing clean, dry, intact. Suture and stat lock intact. Flushes/aspirates easily  Skin:    General: Skin is warm and dry.  Neurological:     Mental Status: She is alert and oriented to person, place, and time.  Psychiatric:        Mood and Affect: Mood normal.        Behavior: Behavior normal.  Thought Content: Thought content normal.        Judgment: Judgment normal.     Imaging: DG Lumbar Spine 2-3 Views  Result Date: 09/29/2022 CLINICAL DATA:  [id number] Surgery, elective [id number] EXAM: LUMBAR SPINE - 2-3 VIEW COMPARISON:  September 23, 2022 FINDINGS: Spot fluoroscopy image was obtained for surgical planning purposes. This demonstrates placement of metallic hardware overlying the approximate L3-4 level and L5 vertebral body level. Surgical drains. Time: 2 seconds Dose: 0.68 mGy Please reference  procedure report for further details. IMPRESSION: Spot fluoroscopy image for surgical planning purposes. Electronically Signed   By: Meda Klinefelter M.D.   On: 09/29/2022 14:21   DG C-Arm 1-60 Min-No Report  Result Date: 09/29/2022 Fluoroscopy was utilized by the requesting physician.  No radiographic interpretation.   CT GUIDED PERITONEAL/RETROPERITONEAL FLUID DRAIN BY PERC CATH  Result Date: 09/27/2022 INDICATION: 67 year old female with a history of psoas abscess referred for drainage EXAM: CT-GUIDED DRAINAGE OF PSOAS ABSCESS TECHNIQUE: Multidetector CT imaging of the pelvis was performed following the standard protocol without IV contrast. RADIATION DOSE REDUCTION: This exam was performed according to the departmental dose-optimization program which includes automated exposure control, adjustment of the mA and/or kV according to patient size and/or use of iterative reconstruction technique. MEDICATIONS: The patient is currently admitted to the hospital and receiving intravenous antibiotics. The antibiotics were administered within an appropriate time frame prior to the initiation of the procedure. ANESTHESIA/SEDATION: Moderate (conscious) sedation was employed during this procedure. A total of Versed 1.0 mg and Fentanyl 50 mcg was administered intravenously by the radiology nurse. Total intra-service moderate Sedation Time: 16 minutes. The patient's level of consciousness and vital signs were monitored continuously by radiology nursing throughout the procedure under my direct supervision. COMPLICATIONS: None PROCEDURE: Informed written consent was obtained from the patient after a thorough discussion of the procedural risks, benefits and alternatives. All questions were addressed. Maximal Sterile Barrier Technique was utilized including caps, mask, sterile gowns, sterile gloves, sterile drape, hand hygiene and skin antiseptic. A timeout was performed prior to the initiation of the procedure. Patient  was position prone on the CT gantry table. Scout CT acquired for planning purposes. The patient was prepped and draped in the usual sterile fashion. 1% lidocaine was used for local anesthesia. Using CT guidance, trocar needle was placed from left flank approach into the left psoas abscess. Modified Seldinger technique was then used to place a 10 [place] drain into the abscess. Approximately 30 cc of frankly purulent material aspirated. Sample was sent for culture. Drain was sutured in position attached to bulb suction. Final images were stored. Patient tolerated the procedure well and remained hemodynamically stable throughout. No complications were encountered and no significant blood loss. IMPRESSION: Status post CT-guided drainage of left psoas abscess. Signed, Yvone Neu. Miachel Roux, RPVI [facility] Electronically Signed   By: Gilmer Mor D.O.   On: 09/27/2022 13:11   CT ABDOMEN PELVIS WO CONTRAST  Result Date: 09/26/2022 CLINICAL DATA:  Abdominal pain, drain placement EXAM: CT ABDOMEN AND PELVIS WITHOUT CONTRAST TECHNIQUE: Multidetector CT imaging of the abdomen and pelvis was performed following the standard protocol without IV contrast. RADIATION DOSE REDUCTION: This exam was performed according to the departmental dose-optimization program which includes automated exposure control, adjustment of the mA and/or kV according to patient size and/or use of iterative reconstruction technique. COMPARISON:  09/23/2022 FINDINGS: Lower chest: Small pericardial effusion slightly increased since prior study. New trace left pleural effusion. Hepatobiliary: Unremarkable  unenhanced appearance of the liver and gallbladder. Pancreas: Unremarkable unenhanced appearance. Spleen: Unremarkable unenhanced appearance. Adrenals/Urinary Tract: No urinary tract calculi or obstructive uropathy within either kidney. The adrenals are stable. Bladder is unremarkable.  Stomach/Bowel: No bowel obstruction or ileus. Normal appendix right lower quadrant. No bowel wall thickening or inflammatory change. Vascular/Lymphatic: Aortic atherosclerosis. No enlarged abdominal or pelvic lymph nodes. Reproductive: Uterus and bilateral adnexa are unremarkable. Other: No free fluid or free intraperitoneal gas. No abdominal wall hernia. Musculoskeletal: Left psoas abscess unchanged measuring 3.2 x 3.2 cm reference image 37/2. Evaluation is limited without intravenous contrast. Fluid collections within the paraspinal musculatures seen previously have diminished after aspiration and drain placement. Indwelling pigtail drainage catheter within the left paraspinous musculature at the L3 level, with no remaining fluid collection at that location. A small residual fluid collection in the right paraspinous musculature is visualized measuring 1.9 x 0.9 cm reference image 33/2. The epidural abscess seen within the dorsal aspect of the thecal sac at the L4 level previously is less well visualized on this study without contrast, but is likely stable measuring 12 x 8 mm reference image 31/2. Erosive changes within the bilateral L3-4 and L4-5 facet joints, greatest on the left at L3-4, consistent with septic arthritis and osteomyelitis seen on prior MRI. Reconstructed images demonstrate no additional findings. IMPRESSION: 1. Resolution of the left paraspinous intramuscular abscess after indwelling drain placement as above. 2. Marked decrease in the right paraspinous intramuscular abscess after aspiration. 3. Stable left sella S abscess. 4. Stable epidural abscess centered at the L4 level. 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 5. Small pericardial effusion, slightly increased since prior study. 6. New trace left pleural effusion. Electronically Signed   By: Sharlet Salina M.D.   On: 09/26/2022 21:03    Labs:  CBC: Recent Labs    09/24/22 0424  09/27/22 0414 09/28/22 0438 09/30/22 0343  WBC 18.0* 10.1 9.3 12.6*  HGB 10.6* 11.3* 9.9* 9.0*  HCT 32.0* 34.4* 30.1* 26.4*  PLT 332 385 346 343    COAGS: Recent Labs    09/23/22 1206 09/27/22 0919  INR 1.2 1.3*  APTT 36  --     BMP: Recent Labs    09/23/22 1038 09/24/22 0424 09/25/22 0437 09/28/22 0438 09/29/22 1312 09/30/22 0343  NA 136 134*  --  134*  --  139  K 3.7 3.8  --  3.3*  --  3.9  CL 102 103  --  104  --  110  CO2 26 24  --  24  --  22  GLUCOSE 115* 95  --  83  --  78  BUN 17 14  --  11  --  13  CALCIUM 8.8* 8.4*  --  8.0*  --  8.4*  CREATININE 0.58 0.56 0.50 0.48 0.61 0.49  GFRNONAA >60 >60 >60 >60 >60 >60    LIVER FUNCTION TESTS: Recent Labs    10/12/21 1007  BILITOT 0.5  AST 17  ALT 12  ALKPHOS 301*  PROT 7.7  ALBUMIN 3.4*    Assessment and Plan:  History of laminectomy May 2023 now with dorsal epidural abscess, large bilateral paraspinal abscesses and left psoas abscess. S/p aspiration of a right paraspinal abscess and placement of a left paraspinal abscess drain 09/23/22. Gram stain shows gram positive cocci in clusters with methicillin resistant staphylococcus aureus growing on culture. WBC up to 12.6 today, was 9.3 yesterday. Will continue to monitor.  S/p left psoas abscess drain placement 09/27/22. Gram stain with few Gram positive cocci, culture revealed MRSA.  Drain Location: Left inferior back x2 (paraspinal drain is superior to psoas drain) Size: 12 Fr (paraspinal), 10 Fr (psoas) Date of placement: 09/23/22; 09/27/22 Currently to: Drain collection device: suction bulb 24 hour output:  Output by Drain (mL) 09/28/22 0701 - 09/28/22 1900 09/28/22 1901 - 09/29/22 0700 09/29/22 0701 - 09/29/22 1900 09/29/22 1901 - 09/30/22 0700 09/30/22 0701 - 09/30/22 1456  Closed System Drain Inferior;Left;Medial Back Bulb (JP) 10 5 10 10    Closed System Drain 2 Inferior;Lateral;Left Flank Bulb (JP) 10 10 5 5    Closed System Drain 1 Right Back  Accordion (Hemovac) 10 Fr.    100     Interval imaging/drain manipulation:  CT AP w/o Contrast 09/26/22 Left psoas abscess drain placement 09/27/22  Current examination: Flushes/aspirates easily.  Insertion site unremarkable. Suture and stat lock in place. Dressed appropriately.   Plan: Continue TID flushes with 5 cc NS. Record output Q shift. Dressing changes QD or PRN if soiled.  Call IR APP or on call IR MD if difficulty flushing or sudden change in drain output.  Repeat imaging/possible drain injection once output < 10 mL/QD (excluding flush material). Consideration for drain removal if output is < 10 mL/QD (excluding flush material), pending discussion with the providing surgical service.  Discharge planning: Please contact IR APP or on call IR MD prior to patient d/c to ensure appropriate follow up plans are in place. Typically patient will follow up with IR clinic 10-14 days post d/c for repeat imaging/possible drain injection. IR scheduler will contact patient with date/time of appointment. Patient will need to flush drain QD with 5 cc NS, record output QD, dressing changes every 2-3 days or earlier if soiled.   IR will continue to follow - please call with questions or concerns.    Electronically Signed: Kennieth Francois, PA-C 09/30/2022, 2:56 PM   I spent a total of 15 Minutes at the the patient's bedside AND on the patient's hospital floor or unit, greater than 50% of which was counseling/coordinating care for left paraspinal abscess, left psoas abscess.

## 2022-09-30 NOTE — Progress Notes (Signed)
Progress Note   Patient: Christy Ray WJX:[id number] DOB: 08-Nov-1955 DOA: 09/23/2022     7 DOS: the patient was seen and examined on 09/30/2022   Brief hospital course: [name] is a 67 year old female with history of dermatomyositis, hypertension, hyperlipidemia, who presents to the emergency department for chief concerns of low back pain. Patient had a disseminated MRSA infection in 2023, involving left great toe septic arthritis, left psoas abscess, multilevel epidural abscess.  She had a lumbar laminectomy, completed 6 weeks IV antibiotics followed by oral Bactrim.  Bactrim was discontinued due to allergy. Patient was doing well until recently when he developed significant back pain. She has significant cytosis of 23.4, CRP 11.9.  Sed rate 60.  CT scan showed dorsal epidural abscess measuring 5.0 x 1.0 x 1.3 cm with associated bony erosion of the left L3-L4 facet joint suggestive of septic arthritis. Bilateral paraspinal and left psoas abscesses.  MRI of the lumbar spine showed large posterior paraspinal intramuscular abscess on the right and on the left as well as the left psoas muscle abscess.  Compression of the cauda equina nerve roots At L3-L4 secondary to space-occupying epidural abscess. Left L3-L4 and L4-L5 facet joint bone marrow edema and subtle erosive changes noted in the posterior margin of the left L3-L4 and L3 left L4-L5 facet joints consistent with septic arthritis and osteomyelitis.   Patient is seen by neurosurgery, ID, IR performed right paraspinal abscess aspiration, left paraspinal abscess drain on 7/26.  Patient is treated with vancomycin and cefepime.  Repeated CT scan on 7/29 improved left paraspinal abscess, still present over right paraspinal abscess and epidural abscess.    7/30.  Left psoas muscle abscess drained on 7/30. 7/31.  Neurosurgery plans on doing a spinal procedure for epidural abscess on 8/1. 8/1.  Neurosurgical procedure done for epidural  abscess wash out and drain placement. 8/2.  Feeling well after neurosurgical procedure yesterday.  Anxious to go home.  Assessment and Plan: * Epidural abscess Neurosurgical procedure on 8/1 for epidural abscess removal and washout.  Continue vancomycin.  MRSA infection.  Sepsis (HCC) MRSA infection.  Present on admission with epidural abscess and left psoas muscle abscess, leukocytosis and tachycardia.  Status post drain placements by interventional radiology.  Continue vancomycin  Psoas muscle abscess (HCC) Status post drain placement by interventional radiology on 7/30.  MRSA infection.  Hyperlipidemia On Crestor  Dermatomyositis (HCC) Follow-up as outpatient  Essential hypertension Continue Norvasc and Cozaar.  Anemia of chronic disease Last hemoglobin 9.0  GERD (gastroesophageal reflux disease) Patient to bring in her own Nexium.  Malnutrition of moderate degree Continue supplements  Hyponatremia Sodium normalized  Hypokalemia Replaced        Subjective: Patient feeling fine postoperative day 1 for neurosurgical procedure.  Admitted with epidural abscess and left psoas muscle abscess.  Physical Exam: Vitals:   09/29/22 1649 09/29/22 2150 09/30/22 0004 09/30/22 0437  BP: 95/63 119/72 125/71 126/75  Pulse: 86 80 80 84  Resp: 16 19  18   Temp: 97.7 F (36.5 C) 98.1 F (36.7 C) 97.8 F (36.6 C) 98 F (36.7 C)  TempSrc: Oral Oral Oral Oral  SpO2: 99% 98% 98% 99%  Weight:      Height:       Physical Exam HENT:     Head: Normocephalic.     Mouth/Throat:     Pharynx: No oropharyngeal exudate.  Eyes:     General: Lids are normal.     Conjunctiva/sclera: Conjunctivae normal.  Cardiovascular:  Rate and Rhythm: Normal rate and regular rhythm.     Heart sounds: Normal heart sounds, S1 normal and S2 normal.  Pulmonary:     Breath sounds: No decreased breath sounds, wheezing, rhonchi or rales.  Abdominal:     Palpations: Abdomen is soft.      Tenderness: There is no abdominal tenderness.  Musculoskeletal:     Right lower leg: No swelling.     Left lower leg: No swelling.  Skin:    General: Skin is warm.     Findings: No rash.  Neurological:     Mental Status: She is alert and oriented to person, place, and time.     Data Reviewed: White blood cell count 12.6, hemoglobin 9.0, creatinine 0.49  Family Communication: Spoke with husband at the bedside  Disposition: Status is: Inpatient Remains inpatient appropriate because: Postoperative day 1 for neurosurgical procedure for epidural abscess  Planned Discharge Destination: Home    Time spent: 27 minutes  Author: Alford Highland, MD 09/30/2022 2:02 PM  For on call review [email or web address].

## 2022-09-30 NOTE — Progress Notes (Signed)
Attending Progress Note  History: Christy Ray is here for a MRSA infection.  Given the progressive nature of her disease with worsening spinal stenosis she was taken to the OR yesterday for washout and laminectomy for epidural abscess.  Postoperative day 0: Stable neurologic exam upon emergence from anesthesia Postoperative day 2: Stable neurologic exam, dressing was examined, had some blood staining but no concerns at this time no active leakage.  Drain is still bloody with mixed purulence.  Physical Exam: Vitals:   09/30/22 0004 09/30/22 0437  BP: 125/71 126/75  Pulse: 80 84  Resp:  18  Temp: 97.8 F (36.6 C) 98 F (36.7 C)  SpO2: 98% 99%    AA Ox3 CNI  Antigravity strength in bilateral lower extremities no change in sensation  Data:  Recent Labs  Lab 09/24/22 0424 09/25/22 0437 09/28/22 0438 09/29/22 1312 09/30/22 0343  NA 134*  --  134*  --  139  K 3.8  --  3.3*  --  3.9  CL 103  --  104  --  110  CO2 24  --  24  --  22  BUN 14  --  11  --  13  CREATININE 0.56   < > 0.48   < > 0.49  GLUCOSE 95  --  83  --  78  CALCIUM 8.4*  --  8.0*  --  8.4*   < > = values in this interval not displayed.   No results for input(s): "AST", "ALT", "ALKPHOS" in the last 168 hours.  Invalid input(s): "TBILI"   Recent Labs  Lab 09/27/22 0414 09/28/22 0438 09/30/22 0343  WBC 10.1 9.3 12.6*  HGB 11.3* 9.9* 9.0*  HCT 34.4* 30.1* 26.4*  PLT 385 346 343   Recent Labs  Lab 09/23/22 1206 09/27/22 0919  APTT 36  --   INR 1.2 1.3*         Other tests/results: Intraoperative cultures are pending  Assessment/Plan:  Christy Ray is a 67 year old woman with a history of MRSA bacteremia as well as a prior epidural spinal abscess which was drained over a year ago.  She presented with worsening abscess burden, and was taken to the OR yesterday for laminectomy and washout.  Overall she is doing well.  - mobilize - pain control - DVT prophylaxis okay to resume, -  PTOT -Will continue to keep her JP drain in place and monitor outputs.  Likely removal Sunday or Monday depending on her discharge plans.  Lovenia Kim, MD/MSCR Department of Neurosurgery

## 2022-09-30 NOTE — Consult Note (Signed)
Pharmacy Antibiotic Note  Christy Ray is a 67 y.o. female admitted on 09/23/2022 with sepsis and a hx to back pain persisting for several weeks. CT of abdomen showed epidural abscess. Aspiration of R paraspinal abscess with placement of drain #1 (7/26), culture showed MRSA. Left psoas abscess drain #2 placement, gram positive cocci in clusters/MRSA (8/1_7/30). Pharmacy has been consulted to dose vancomycin.  Plan: Continue vancomycin 1000 mg IV Q12H based on trough of 12 (8/1) and ID pharmacist Day 8 of antibiotics Goal AUC 400-550. Peak: 24; Trough 5 (7/29) Expected AUC: 475.4 Expected Css min: 29.4 Continue to monitor renal function and culture results Per ID plan for 6-8 weeks of IV therapy then transition to PO for an to be determined period  Height: 5\' 5"  (165.1 cm) Weight: 59.4 kg (130 lb 15.3 oz) IBW/kg (Calculated) : 57  Temp (24hrs), Avg:98 F (36.7 C), Min:97.7 F (36.5 C), Max:98.2 F (36.8 C)  Recent Labs  Lab 09/23/22 1038 09/23/22 1203 09/23/22 1547 09/24/22 0424 09/25/22 0437 09/26/22 1104 09/26/22 1505 09/27/22 0414 09/28/22 0438 09/29/22 1312 09/30/22 0343  WBC 23.4*  --   --  18.0*  --   --   --  10.1 9.3  --  12.6*  CREATININE 0.58  --   --  0.56 0.50  --   --   --  0.48 0.61 0.49  LATICACIDVEN  --  1.0 1.0  --   --   --   --   --   --   --   --   VANCOTROUGH  --   --   --   --   --  5*  --   --   --  12*  --   VANCOPEAK  --   --   --   --   --   --  24*  --   --   --   --     Estimated Creatinine Clearance: 59.7 mL/min (by C-G formula based on SCr of 0.49 mg/dL).    Allergies  Allergen Reactions   Sulfa Antibiotics Rash    Antimicrobials this admission: Cefepime 7/26 >> 7/29 Metronidazole 7/26 >> 7/29 Vancomycin 7/26 >>   Dose adjustments this admission: 7/27 - 7/29 Vancomycin 1250 mg IV daily   Microbiology results: 7/26 BCx: NGTD 7/26 Abscess Cx: MRSA 7/30 Organism ID, MRSA 8/1 Aerobic/Anaerobic Abscess Cx: GPC in clusters  Thank  you for allowing pharmacy to be a part of this patient's care.  Effie Shy 09/30/2022 9:44 AM

## 2022-09-30 NOTE — Care Management Important Message (Signed)
Important Message  Patient Details  Name: KASHIRA BEHUNIN MRN: [id number] Date of Birth: 1955/06/14   Medicare Important Message Given:  Yes  Patient is in an isolation room so I reviewed her Important Message from Medicare with her by phone [phone]). I wished her a speedy recovery and thanked her for her time.   Olegario Messier A  09/30/2022, 2:12 PM

## 2022-09-30 NOTE — Plan of Care (Signed)
  Problem: Fluid Volume: Goal: Hemodynamic stability will improve Outcome: Progressing   Problem: Clinical Measurements: Goal: Diagnostic test results will improve Outcome: Progressing Goal: Signs and symptoms of infection will decrease Outcome: Progressing   Problem: Respiratory: Goal: Ability to maintain adequate ventilation will improve Outcome: Progressing   Problem: Education: Goal: Knowledge of General Education information will improve Description: Including pain rating scale, medication(s)/side effects and non-pharmacologic comfort measures Outcome: Progressing   Problem: Health Behavior/Discharge Planning: Goal: Ability to manage health-related needs will improve Outcome: Progressing   Problem: Clinical Measurements: Goal: Ability to maintain clinical measurements within normal limits will improve Outcome: Progressing Goal: Will remain free from infection Outcome: Progressing Goal: Diagnostic test results will improve Outcome: Progressing Goal: Respiratory complications will improve Outcome: Progressing Goal: Cardiovascular complication will be avoided Outcome: Progressing   Problem: Activity: Goal: Risk for activity intolerance will decrease Outcome: Progressing   Problem: Nutrition: Goal: Adequate nutrition will be maintained Outcome: Progressing

## 2022-09-30 NOTE — Progress Notes (Signed)
Date of Admission:  09/23/2022    is a 67 y.o. female  Principal Problem:   Epidural abscess Active Problems:   Gout   Hypokalemia   Essential hypertension   Dermatomyositis (HCC)   Psoas muscle abscess (HCC)   Sepsis (HCC)   Hyperlipidemia   Headache   Hyponatremia   Abscess of paraspinal muscles   Malnutrition of moderate degree   MRSA infection   GERD (gastroesophageal reflux disease)   Anemia of chronic disease    Subjective: No specific complaints Doing okay   Medications:   amLODipine  5 mg Oral Daily   enoxaparin (LOVENOX) injection  40 mg Subcutaneous Q24H   esomeprazole  40 mg Oral BID   feeding supplement  237 mL Oral TID BM   folic acid  1 mg Oral Daily   losartan  100 mg Oral Daily   multivitamin with minerals  1 tablet Oral Daily   rosuvastatin  10 mg Oral QHS   senna-docusate  2 tablet Oral BID   sodium chloride flush  5 mL Intracatheter Q8H   sodium chloride flush  5 mL Intracatheter Q8H    Objective: Vital signs in last 24 hours: Patient Vitals for the past 24 hrs:  BP Temp Temp src Pulse Resp SpO2  09/30/22 0437 126/75 98 F (36.7 C) Oral 84 18 99 %  09/30/22 0004 125/71 97.8 F (36.6 C) Oral 80 -- 98 %  09/29/22 2150 119/72 98.1 F (36.7 C) Oral 80 19 98 %  09/29/22 1649 95/63 97.7 F (36.5 C) Oral 86 16 99 %  09/29/22 1132 112/63 98.2 F (36.8 C) -- 86 19 98 %  09/29/22 1130 (!) 96/51 -- -- 78 15 100 %  09/29/22 1122 110/78 -- -- -- 14 100 %  09/29/22 1119 -- 97.9 F (36.6 C) -- -- -- --    PHYSICAL EXAM:  General: Alert, cooperative, no distress,  Back: 2 drains on the lower back left side and a vac drain post surgery Surgical dressing Lungs: b/l air entry. Heart: Regular rate and rhythm, no murmur, rub or gallop. Abdomen: Soft, non-tender,not distended. Bowel sounds normal. No masses Extremities: atraumatic, no cyanosis. No edema. No clubbing Skin: No rashes or lesions. Or bruising Lymph: Cervical, supraclavicular  normal. Neurologic: Grossly non-focal  Lab Results    Latest Ref Rng & Units 09/30/2022    3:43 AM 09/28/2022    4:38 AM 09/27/2022    4:14 AM  CBC  WBC 4.0 - 10.5 K/uL 12.6  9.3  10.1   Hemoglobin 12.0 - 15.0 g/dL 9.0  9.9  66.0   Hematocrit 36.0 - 46.0 % 26.4  30.1  34.4   Platelets 150 - 400 K/uL 343  346  385        Latest Ref Rng & Units 09/30/2022    3:43 AM 09/29/2022    1:12 PM 09/28/2022    4:38 AM  CMP  Glucose 70 - 99 mg/dL 78   83   BUN 8 - 23 mg/dL 13   11   Creatinine 6.30 - 1.00 mg/dL 1.60  1.09  3.23   Sodium 135 - 145 mmol/L 139   134   Potassium 3.5 - 5.1 mmol/L 3.9   3.3   Chloride 98 - 111 mmol/L 110   104   CO2 22 - 32 mmol/L 22   24   Calcium 8.9 - 10.3 mg/dL 8.4   8.0       Microbiology: MRSA in  3 cultures Studies/Results: DG Lumbar Spine 2-3 Views  Result Date: 09/29/2022 CLINICAL DATA:  [id number] Surgery, elective [id number] EXAM: LUMBAR SPINE - 2-3 VIEW COMPARISON:  September 23, 2022 FINDINGS: Spot fluoroscopy image was obtained for surgical planning purposes. This demonstrates placement of metallic hardware overlying the approximate L3-4 level and L5 vertebral body level. Surgical drains. Time: 2 seconds Dose: 0.68 mGy Please reference procedure report for further details. IMPRESSION: Spot fluoroscopy image for surgical planning purposes. Electronically Signed   By: Meda Klinefelter M.D.   On: 09/29/2022 14:21   DG C-Arm 1-60 Min-No Report  Result Date: 09/29/2022 Fluoroscopy was utilized by the requesting physician.  No radiographic interpretation.           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 Patient was treated at that time with 10 weeks of antibiotics   She  presented last week with back pain of 2 weeks duration and worse over the last few days   MRSA infection Extensive infection of the lumbar spine with bilateral paraspinal abscess, left psoas abscess, epidural abscess involving L3-L4 and L4-L5, facet joint septic arthritis on the left L3-L5 Left  paraspinal drain Left psoas drain   epidural abscess/phlegmon Underwent laminectomy l3-l4  and washout- also had wash out of the rt paraspinal abscess today ( 09/29/22)  Continue vancomycin q 12 Will need for 6-8 weeks of IV  followed by PO antibiotic for a long time   The drains will have to be removed before she is discharged ? Imaging next week  Discussed the management with the patient,  care team [facility] on call this weekend- availble on the phone for urgent issues

## 2022-09-30 NOTE — Progress Notes (Signed)
Nutrition Follow-up  DOCUMENTATION CODES:   Non-severe (moderate) malnutrition in context of acute illness/injury  INTERVENTION:   -Continue Ensure Enlive po TID, each supplement provides 350 kcal and 20 grams of protein -Continue MVI with minerals daily -Continue regular diet  NUTRITION DIAGNOSIS:   Moderate Malnutrition related to acute illness as evidenced by mild fat depletion, mild muscle depletion, moderate muscle depletion.  Ongoing  GOAL:   Patient will meet greater than or equal to 90% of their needs  Progressing   MONITOR:   PO intake, Supplement acceptance  REASON FOR ASSESSMENT:   Malnutrition Screening Tool    ASSESSMENT:   Pt with history of dermatomyositis, hypertension, hyperlipidemia, who presents for chief concerns of low back pain.  7/30- s/p image guided drain placement, left psoas abscess  8/1- s/p epidural abscess removal and washout   Reviewed I/O's: +720 ml x 24 hours and +3.9 L since admission  Drains: 130 ml x 24 hours   Pt unavailable at time of visit.   Pt with improved oral intake. Noted meal completions 100%. Pt is intermittently accepting of Ensure supplements.   No new wt since admission.   Medications reviewed and include lovenox and senokot.   Labs reviewed.    Diet Order:   Diet Order             Diet regular Room service appropriate? Yes; Fluid consistency: Thin  Diet effective now                   EDUCATION NEEDS:   Education needs have been addressed  Skin:  Skin Assessment: Reviewed RN Assessment  Last BM:  09/23/22  Height:   Ht Readings from Last 1 Encounters:  09/23/22 5\' 5"  (1.651 m)    Weight:   Wt Readings from Last 1 Encounters:  09/23/22 59.4 kg    Ideal Body Weight:  56.8 kg  BMI:  Body mass index is 21.79 kg/m.  Estimated Nutritional Needs:   Kcal:  1750-1950  Protein:  90-105 grams  Fluid:  > 1.7 L    Levada Schilling, RD, LDN, CDCES Registered Dietitian II Certified  Diabetes Care and Education Specialist Please refer to [facility] for RD and/or RD on-call/weekend/after hours pager

## 2022-10-01 DIAGNOSIS — E785 Hyperlipidemia, unspecified: Secondary | ICD-10-CM | POA: Diagnosis not present

## 2022-10-01 DIAGNOSIS — G062 Extradural and subdural abscess, unspecified: Secondary | ICD-10-CM | POA: Diagnosis not present

## 2022-10-01 DIAGNOSIS — A4102 Sepsis due to Methicillin resistant Staphylococcus aureus: Secondary | ICD-10-CM | POA: Diagnosis not present

## 2022-10-01 DIAGNOSIS — K6812 Psoas muscle abscess: Secondary | ICD-10-CM | POA: Diagnosis not present

## 2022-10-01 NOTE — Progress Notes (Signed)
Attending Progress Note  History: Christy Ray is here for a MRSA infection.  Given the progressive nature of her disease with worsening spinal stenosis she was taken to the OR yesterday for washout and laminectomy for epidural abscess.  Postoperative day 0: Stable neurologic exam upon emergence from anesthesia Postoperative day 1: Stable neurologic exam, dressing was examined, had some blood staining but no concerns at this time no active leakage.  Drain is still bloody with mixed purulence. Postoperative Day 2: Overall doing well, continues to have serosanguineous drainage from her JP drain.  States that her back is slightly sore.  Incision doing well.  No changes in her lower extremities.   Physical Exam: Vitals:   10/01/22 0420 10/01/22 0834  BP: 131/64 125/87  Pulse: 75 85  Resp: 19 16  Temp: 98.2 F (36.8 C) 98 F (36.7 C)  SpO2: 98% 97%    AA Ox3 CNI  Antigravity strength in bilateral lower extremities no change in sensation  Data:  Recent Labs  Lab 09/28/22 0438 09/29/22 1312 09/30/22 0343  NA 134*  --  139  K 3.3*  --  3.9  CL 104  --  110  CO2 24  --  22  BUN 11  --  13  CREATININE 0.48   < > 0.49  GLUCOSE 83  --  78  CALCIUM 8.0*  --  8.4*   < > = values in this interval not displayed.   No results for input(s): "AST", "ALT", "ALKPHOS" in the last 168 hours.  Invalid input(s): "TBILI"   Recent Labs  Lab 09/27/22 0414 09/28/22 0438 09/30/22 0343  WBC 10.1 9.3 12.6*  HGB 11.3* 9.9* 9.0*  HCT 34.4* 30.1* 26.4*  PLT 385 346 343   Recent Labs  Lab 09/27/22 0919  INR 1.3*         Other tests/results: Intraoperative cultures are pending  Assessment/Plan:  Christy Ray is a 67 year old woman with a history of MRSA bacteremia as well as a prior epidural spinal abscess which was drained over a year ago.  She presented with worsening abscess burden, and was taken to the OR on 8/1 for laminectomy and washout.  Overall she is doing well.  -  mobilize - pain control - DVT prophylaxis okay  - PTOT -Will continue to keep her JP drain in place and monitor outputs.  Likely removal Sunday or Monday depending on her discharge plans.  Lovenia Kim, MD/MSCR Department of Neurosurgery

## 2022-10-01 NOTE — Plan of Care (Signed)
  Problem: Fluid Volume: Goal: Hemodynamic stability will improve Outcome: Progressing   Problem: Clinical Measurements: Goal: Diagnostic test results will improve Outcome: Progressing Goal: Signs and symptoms of infection will decrease Outcome: Progressing   Problem: Respiratory: Goal: Ability to maintain adequate ventilation will improve Outcome: Progressing   Problem: Education: Goal: Knowledge of General Education information will improve Description: Including pain rating scale, medication(s)/side effects and non-pharmacologic comfort measures Outcome: Progressing   Problem: Health Behavior/Discharge Planning: Goal: Ability to manage health-related needs will improve Outcome: Progressing   Problem: Clinical Measurements: Goal: Ability to maintain clinical measurements within normal limits will improve Outcome: Progressing Goal: Will remain free from infection Outcome: Progressing Goal: Diagnostic test results will improve Outcome: Progressing Goal: Respiratory complications will improve Outcome: Progressing Goal: Cardiovascular complication will be avoided Outcome: Progressing   Problem: Activity: Goal: Risk for activity intolerance will decrease Outcome: Progressing   Problem: Nutrition: Goal: Adequate nutrition will be maintained Outcome: Progressing   Problem: Coping: Goal: Level of anxiety will decrease Outcome: Progressing   Problem: Elimination: Goal: Will not experience complications related to bowel motility Outcome: Progressing Goal: Will not experience complications related to urinary retention Outcome: Progressing   Problem: Pain Managment: Goal: General experience of comfort will improve Outcome: Progressing   Problem: Safety: Goal: Ability to remain free from injury will improve Outcome: Progressing   Problem: Skin Integrity: Goal: Risk for impaired skin integrity will decrease Outcome: Progressing   Problem: Education: Goal: Ability  to verbalize activity precautions or restrictions will improve Outcome: Progressing Goal: Knowledge of the prescribed therapeutic regimen will improve Outcome: Progressing Goal: Understanding of discharge needs will improve Outcome: Progressing   Problem: Activity: Goal: Ability to avoid complications of mobility impairment will improve Outcome: Progressing Goal: Ability to tolerate increased activity will improve Outcome: Progressing Goal: Will remain free from falls Outcome: Progressing   Problem: Bowel/Gastric: Goal: Gastrointestinal status for postoperative course will improve Outcome: Progressing   Problem: Clinical Measurements: Goal: Ability to maintain clinical measurements within normal limits will improve Outcome: Progressing Goal: Postoperative complications will be avoided or minimized Outcome: Progressing Goal: Diagnostic test results will improve Outcome: Progressing   Problem: Pain Management: Goal: Pain level will decrease Outcome: Progressing   Problem: Skin Integrity: Goal: Will show signs of wound healing Outcome: Progressing   Problem: Health Behavior/Discharge Planning: Goal: Identification of resources available to assist in meeting health care needs will improve Outcome: Progressing   Problem: Bladder/Genitourinary: Goal: Urinary functional status for postoperative course will improve Outcome: Progressing   

## 2022-10-01 NOTE — Plan of Care (Signed)

## 2022-10-01 NOTE — Progress Notes (Signed)
Progress Note   Patient: Christy Ray XBJ:[id number] DOB: August 15, 1955 DOA: 09/23/2022     8 DOS: the patient was seen and examined on 10/01/2022   Brief hospital course: Ms. Shannyn Jankowiak is a 67 year old female with history of dermatomyositis, hypertension, hyperlipidemia, who presents to the emergency department for chief concerns of low back pain. Patient had a disseminated MRSA infection in 2023, involving left great toe septic arthritis, left psoas abscess, multilevel epidural abscess.  She had a lumbar laminectomy, completed 6 weeks IV antibiotics followed by oral Bactrim.  Bactrim was discontinued due to allergy. Patient was doing well until recently when he developed significant back pain. She has significant cytosis of 23.4, CRP 11.9.  Sed rate 60.  CT scan showed dorsal epidural abscess measuring 5.0 x 1.0 x 1.3 cm with associated bony erosion of the left L3-L4 facet joint suggestive of septic arthritis. Bilateral paraspinal and left psoas abscesses.  MRI of the lumbar spine showed large posterior paraspinal intramuscular abscess on the right and on the left as well as the left psoas muscle abscess.  Compression of the cauda equina nerve roots At L3-L4 secondary to space-occupying epidural abscess. Left L3-L4 and L4-L5 facet joint bone marrow edema and subtle erosive changes noted in the posterior margin of the left L3-L4 and L3 left L4-L5 facet joints consistent with septic arthritis and osteomyelitis.   Patient is seen by neurosurgery, ID, IR performed right paraspinal abscess aspiration, left paraspinal abscess drain on 7/26.  Patient is treated with vancomycin and cefepime.  Repeated CT scan on 7/29 improved left paraspinal abscess, still present over right paraspinal abscess and epidural abscess.    7/30.  Left psoas muscle abscess drained on 7/30. 7/31.  Neurosurgery plans on doing a spinal procedure for epidural abscess on 8/1. 8/1.  Neurosurgical procedure done for epidural  abscess wash out and drain placement. 8/2.  Feeling well after neurosurgical procedure yesterday.  Anxious to go home. 8/3.  Patient having a little back discomfort but able to move around okay.  Assessment and Plan: * Epidural abscess Neurosurgical procedure on 8/1 for epidural abscess removal and washout.  Continue vancomycin.  Spinal culture growing Staph aureus and sensitivities still pending.  Sepsis (HCC) MRSA infection.  Present on admission with epidural abscess and left psoas muscle abscess, leukocytosis and tachycardia.  Status post drain placements by interventional radiology.  Continue vancomycin  Psoas muscle abscess (HCC) Status post drain placement by interventional radiology on 7/30.  MRSA infection.  Hyperlipidemia On Crestor  Dermatomyositis (HCC) Follow-up as outpatient  Essential hypertension Continue Norvasc and Cozaar.  Anemia of chronic disease Last hemoglobin 9.0  GERD (gastroesophageal reflux disease) Patient to bring in her own Nexium.  Malnutrition of moderate degree Continue supplements  Hyponatremia Sodium normalized  Hypokalemia Replaced        Subjective: Patient feels some back discomfort today.  Able to move her legs without issue.  Admitted with MRSA infection.  Physical Exam: Vitals:   09/30/22 1641 09/30/22 2039 10/01/22 0420 10/01/22 0834  BP: (!) 105/55 92/69 131/64 125/87  Pulse: 86 78 75 85  Resp: 16 18 19 16   Temp: 98.7 F (37.1 C) 98.2 F (36.8 C) 98.2 F (36.8 C) 98 F (36.7 C)  TempSrc:   Oral Oral  SpO2: 99% 97% 98% 97%  Weight:      Height:       Physical Exam HENT:     Head: Normocephalic.     Mouth/Throat:     Pharynx:  No oropharyngeal exudate.  Eyes:     General: Lids are normal.     Conjunctiva/sclera: Conjunctivae normal.  Cardiovascular:     Rate and Rhythm: Normal rate and regular rhythm.     Heart sounds: Normal heart sounds, S1 normal and S2 normal.  Pulmonary:     Breath sounds: No  decreased breath sounds, wheezing, rhonchi or rales.  Abdominal:     Palpations: Abdomen is soft.     Tenderness: There is no abdominal tenderness.  Musculoskeletal:     Right lower leg: No swelling.     Left lower leg: No swelling.  Skin:    General: Skin is warm.     Findings: No rash.  Neurological:     Mental Status: She is alert and oriented to person, place, and time.     Comments: Able to straight leg raise bilaterally.     Data Reviewed: Staph aureus growing out of epidural culture and sensitivity still pending.  Disposition: Status is: Inpatient Remains inpatient appropriate because: We will have to set up a plan with IV antibiotics and PICC line prior to disposition.  Infectious disease specialist to reevaluate on Monday.  Planned Discharge Destination: Home with Home Health    Time spent: 28 minutes  Author: Alford Highland, MD 10/01/2022 1:57 PM  For on call review [email or web address].

## 2022-10-02 DIAGNOSIS — G062 Extradural and subdural abscess, unspecified: Secondary | ICD-10-CM | POA: Diagnosis not present

## 2022-10-02 DIAGNOSIS — K6812 Psoas muscle abscess: Secondary | ICD-10-CM | POA: Diagnosis not present

## 2022-10-02 DIAGNOSIS — E785 Hyperlipidemia, unspecified: Secondary | ICD-10-CM | POA: Diagnosis not present

## 2022-10-02 DIAGNOSIS — A4102 Sepsis due to Methicillin resistant Staphylococcus aureus: Secondary | ICD-10-CM | POA: Diagnosis not present

## 2022-10-02 LAB — CREATININE, SERUM
Creatinine, Ser: 0.51 mg/dL (ref 0.44–1.00)
GFR, Estimated: >60 mL/min (ref >60)

## 2022-10-02 NOTE — Plan of Care (Signed)
  Problem: Fluid Volume: Goal: Hemodynamic stability will improve Outcome: Progressing   Problem: Clinical Measurements: Goal: Diagnostic test results will improve Outcome: Progressing Goal: Signs and symptoms of infection will decrease Outcome: Progressing   Problem: Respiratory: Goal: Ability to maintain adequate ventilation will improve Outcome: Progressing   Problem: Education: Goal: Knowledge of General Education information will improve Description: Including pain rating scale, medication(s)/side effects and non-pharmacologic comfort measures Outcome: Progressing   Problem: Health Behavior/Discharge Planning: Goal: Ability to manage health-related needs will improve Outcome: Progressing   Problem: Clinical Measurements: Goal: Ability to maintain clinical measurements within normal limits will improve Outcome: Progressing Goal: Will remain free from infection Outcome: Progressing Goal: Diagnostic test results will improve Outcome: Progressing

## 2022-10-02 NOTE — Progress Notes (Signed)
Progress Note   Patient: Christy Ray:[id number] DOB: [birth year] DOA: 09/23/2022     9 DOS: the patient was seen and examined on 10/02/2022   Brief hospital course: Ms. Arella Blinder is a 67 year old female with history of dermatomyositis, hypertension, hyperlipidemia, who presents to the emergency department for chief concerns of low back pain. Patient had a disseminated MRSA infection in 2023, involving left great toe septic arthritis, left psoas abscess, multilevel epidural abscess.  She had a lumbar laminectomy, completed 6 weeks IV antibiotics followed by oral Bactrim.  Bactrim was discontinued due to allergy. Patient was doing well until recently when he developed significant back pain. She has significant cytosis of 23.4, CRP 11.9.  Sed rate 60.  CT scan showed dorsal epidural abscess measuring 5.0 x 1.0 x 1.3 cm with associated bony erosion of the left L3-L4 facet joint suggestive of septic arthritis. Bilateral paraspinal and left psoas abscesses.  MRI of the lumbar spine showed large posterior paraspinal intramuscular abscess on the right and on the left as well as the left psoas muscle abscess.  Compression of the cauda equina nerve roots At L3-L4 secondary to space-occupying epidural abscess. Left L3-L4 and L4-L5 facet joint bone marrow edema and subtle erosive changes noted in the posterior margin of the left L3-L4 and L3 left L4-L5 facet joints consistent with septic arthritis and osteomyelitis.   Patient is seen by neurosurgery, ID, IR performed right paraspinal abscess aspiration, left paraspinal abscess drain on 7/26.  Patient is treated with vancomycin and cefepime.  Repeated CT scan on 7/29 improved left paraspinal abscess, still present over right paraspinal abscess and epidural abscess.    7/30.  Left psoas muscle abscess drained on 7/30. 7/31.  Neurosurgery plans on doing a spinal procedure for epidural abscess on 8/1. 8/1.  Neurosurgical procedure done for epidural  abscess wash out and drain placement. 8/2.  Feeling well after neurosurgical procedure yesterday.  Anxious to go home. 8/3 &8/4.  Patient having a little back discomfort but able to move around okay.  Assessment and Plan: * Epidural abscess Neurosurgical procedure on 8/1 for epidural abscess removal and washout.  Continue vancomycin.  Spinal culture growing MRSA.  Sepsis (HCC) MRSA infection.  Present on admission with epidural abscess and left psoas muscle abscess, leukocytosis and tachycardia.  Status post drain placements by interventional radiology and neurosurgery.  Continue vancomycin  Psoas muscle abscess (HCC) Status post drain placement by interventional radiology on 7/30.  MRSA infection.  Hyperlipidemia On Crestor  Dermatomyositis (HCC) Follow-up as outpatient  Essential hypertension Continue Norvasc and Cozaar.  Anemia of chronic disease Last hemoglobin 9.0  GERD (gastroesophageal reflux disease) Patient to bring in her own Nexium.  Malnutrition of moderate degree Continue supplements  Hyponatremia Sodium normalized  Hypokalemia Replaced        Subjective: Patient seen this morning.  Still having little back soreness at the site of the drains.  She is hopeful that these drains can be removed soon.  Admitted with epidural abscess and psoas muscle abscess.  Physical Exam: Vitals:   10/01/22 1643 10/01/22 2010 10/02/22 0443 10/02/22 0753  BP: 112/71 111/68 138/78 122/78  Pulse: 89 78 86 78  Resp: 16 18 18 16   Temp: 98.4 F (36.9 C) 98 F (36.7 C) 98.1 F (36.7 C) 98.9 F (37.2 C)  TempSrc:  Oral Oral Oral  SpO2: 97% 99% 96% 96%  Weight:      Height:       Physical Exam HENT:  Head: Normocephalic.     Mouth/Throat:     Pharynx: No oropharyngeal exudate.  Eyes:     General: Lids are normal.     Conjunctiva/sclera: Conjunctivae normal.  Cardiovascular:     Rate and Rhythm: Normal rate and regular rhythm.     Heart sounds: Normal heart  sounds, S1 normal and S2 normal.  Pulmonary:     Breath sounds: No decreased breath sounds, wheezing, rhonchi or rales.  Abdominal:     Palpations: Abdomen is soft.     Tenderness: There is no abdominal tenderness.  Musculoskeletal:     Right lower leg: No swelling.     Left lower leg: No swelling.  Skin:    General: Skin is warm.     Findings: No rash.  Neurological:     Mental Status: She is alert and oriented to person, place, and time.     Comments: Able to straight leg raise bilaterally.     Data Reviewed: Last creatinine 0.51   Disposition: Status is: Inpatient Remains inpatient appropriate because: Reevaluation by infectious disease specialist on Monday to determine plan.  Planned Discharge Destination: Home with Home Health    Time spent: 28 minutes  Author: Alford Highland, MD 10/02/2022 1:00 PM  For on call review [email or web address].

## 2022-10-02 NOTE — Plan of Care (Addendum)
Patient tolerated Vancomycin IV the line failed on flush post infusion. IV team order placed for new line. Patient moved to room 130 and spouse is visiting. Patient took all scheduled meds and pain well treated with PRN medication. Bed locked in lowest position and call bell in reach. Nursing will continue to monitor.  Problem: Respiratory: Goal: Ability to maintain adequate ventilation will improve Outcome: Progressing   Problem: Education: Goal: Knowledge of General Education information will improve Description: Including pain rating scale, medication(s)/side effects and non-pharmacologic comfort measures Outcome: Progressing   Problem: Health Behavior/Discharge Planning: Goal: Ability to manage health-related needs will improve Outcome: Progressing   Problem: Clinical Measurements: Goal: Ability to maintain clinical measurements within normal limits will improve Outcome: Progressing Goal: Will remain free from infection Outcome: Progressing   Problem: Nutrition: Goal: Adequate nutrition will be maintained Outcome: Progressing   Problem: Skin Integrity: Goal: Risk for impaired skin integrity will decrease Outcome: Progressing   Problem: Education: Goal: Ability to verbalize activity precautions or restrictions will improve Outcome: Progressing Goal: Knowledge of the prescribed therapeutic regimen will improve Outcome: Progressing Goal: Understanding of discharge needs will improve Outcome: Progressing   Problem: Pain Management: Goal: Pain level will decrease Outcome: Progressing

## 2022-10-03 ENCOUNTER — Encounter: Payer: Self-pay | Admitting: Internal Medicine

## 2022-10-03 ENCOUNTER — Other Ambulatory Visit: Payer: Self-pay

## 2022-10-03 ENCOUNTER — Inpatient Hospital Stay: Payer: Medicare Other

## 2022-10-03 DIAGNOSIS — G062 Extradural and subdural abscess, unspecified: Secondary | ICD-10-CM | POA: Diagnosis not present

## 2022-10-03 DIAGNOSIS — E785 Hyperlipidemia, unspecified: Secondary | ICD-10-CM | POA: Diagnosis not present

## 2022-10-03 DIAGNOSIS — A4102 Sepsis due to Methicillin resistant Staphylococcus aureus: Secondary | ICD-10-CM | POA: Diagnosis not present

## 2022-10-03 DIAGNOSIS — R918 Other nonspecific abnormal finding of lung field: Secondary | ICD-10-CM | POA: Insufficient documentation

## 2022-10-03 DIAGNOSIS — K6812 Psoas muscle abscess: Secondary | ICD-10-CM | POA: Diagnosis not present

## 2022-10-03 LAB — BASIC METABOLIC PANEL
Anion gap: 9 (ref 5–15)
BUN: 10 mg/dL (ref 8–23)
CO2: 22 mmol/L (ref 22–32)
Calcium: 8.4 mg/dL — ABNORMAL LOW (ref 8.9–10.3)
Chloride: 105 mmol/L (ref 98–111)
Creatinine, Ser: 0.61 mg/dL (ref 0.44–1.00)
GFR, Estimated: >60 mL/min (ref >60)
Glucose, Bld: 93 mg/dL (ref 70–99)
Potassium: 3.5 mmol/L (ref 3.5–5.1)
Sodium: 136 mmol/L (ref 135–145)

## 2022-10-03 LAB — CBC
HCT: 29.4 % — ABNORMAL LOW (ref 36.0–46.0)
Hemoglobin: 9.6 g/dL — ABNORMAL LOW (ref 12.0–15.0)
MCH: 26.8 pg (ref 26.0–34.0)
MCHC: 32.7 g/dL (ref 30.0–36.0)
MCV: 82.1 fL (ref 80.0–100.0)
Platelets: 394 10*3/uL (ref 150–400)
RBC: 3.58 MIL/uL — ABNORMAL LOW (ref 3.87–5.11)
RDW: 15.1 % (ref 11.5–15.5)
WBC: 7.3 10*3/uL (ref 4.0–10.5)
nRBC: 0 % (ref 0.0–0.2)

## 2022-10-03 LAB — VANCOMYCIN, TROUGH: Vancomycin Tr: 15 ug/mL (ref 15–20)

## 2022-10-03 MED ORDER — IOHEXOL 300 MG/ML  SOLN
100.0000 mL | Freq: Once | INTRAMUSCULAR | Status: AC | PRN
  Administered 2022-10-03: 100 mL via INTRAVENOUS

## 2022-10-03 NOTE — Plan of Care (Signed)
Patient waiting for PICC line placement and set up for home health. Possible discharge 8/6. Took all scheduled meds and took pain medication for IR removal of 2 JP drains, tolerated well. Is eating well refuses lovenox patient ambulates with frequency. CT w/contrast completed waiting report. Currently eating lunch with her spouse in bed locked in lowest position call bell within reach. Nursing will continue to monitor.  Problem: Clinical Measurements: Goal: Signs and symptoms of infection will decrease 10/03/2022 1345 by Starla Link, RN Outcome: Progressing 10/03/2022 1303 by Starla Link, RN Outcome: Progressing   Problem: Respiratory: Goal: Ability to maintain adequate ventilation will improve Outcome: Progressing   Problem: Education: Goal: Knowledge of General Education information will improve Description: Including pain rating scale, medication(s)/side effects and non-pharmacologic comfort measures 10/03/2022 1345 by Starla Link, RN Outcome: Progressing 10/03/2022 1303 by Starla Link, RN Outcome: Progressing   Problem: Health Behavior/Discharge Planning: Goal: Ability to manage health-related needs will improve Outcome: Progressing   Problem: Clinical Measurements: Goal: Ability to maintain clinical measurements within normal limits will improve Outcome: Progressing Goal: Will remain free from infection Outcome: Progressing Goal: Diagnostic test results will improve Outcome: Progressing Goal: Respiratory complications will improve Outcome: Progressing   Problem: Elimination: Goal: Will not experience complications related to bowel motility Outcome: Progressing   Problem: Pain Managment: Goal: General experience of comfort will improve Outcome: Progressing   Problem: Education: Goal: Knowledge of the prescribed therapeutic regimen will improve 10/03/2022 1345 by Starla Link, RN Outcome: Progressing 10/03/2022 1303 by Starla Link, RN Outcome: Progressing Goal: Understanding of  discharge needs will improve Outcome: Progressing   Problem: Pain Management: Goal: Pain level will decrease Outcome: Progressing   Problem: Health Behavior/Discharge Planning: Goal: Identification of resources available to assist in meeting health care needs will improve Outcome: Progressing

## 2022-10-03 NOTE — Assessment & Plan Note (Signed)
Radiology not recommending any follow-up.

## 2022-10-03 NOTE — Progress Notes (Signed)
Attending Progress Note  History: Christy Ray is here for a MRSA infection.  Given the progressive nature of her disease with worsening spinal stenosis she was taken to the OR yesterday for washout and laminectomy for epidural abscess.  Postoperative day 0: Stable neurologic exam upon emergence from anesthesia Postoperative day 1: Stable neurologic exam, dressing was examined, had some blood staining but no concerns at this time no active leakage.  Drain is still bloody with mixed purulence. Postoperative Day 2: Overall doing well, continues to have serosanguineous drainage from her JP drain.  States that her back is slightly sore.  Incision doing well.  No changes in her lower extremities.  POD 4: No acute events overnight.  Patient has minimal back pain.  Physical Exam: Vitals:   10/03/22 0534 10/03/22 0801  BP: (!) 142/84 131/75  Pulse: 91 84  Resp: 16 18  Temp: 99.2 F (37.3 C)   SpO2: 98% 98%    AA Ox3 CNI MAEW Incision c/d/I with staples in place  Data:  Recent Labs  Lab 09/28/22 0438 09/29/22 1312 09/30/22 0343 10/02/22 0615 10/03/22 0741  NA 134*  --  139  --  136  K 3.3*  --  3.9  --  3.5  CL 104  --  110  --  105  CO2 24  --  22  --  22  BUN 11  --  13  --  10  CREATININE 0.48   < > 0.49   < > 0.61  GLUCOSE 83  --  78  --  93  CALCIUM 8.0*  --  8.4*  --  8.4*   < > = values in this interval not displayed.   No results for input(s): "AST", "ALT", "ALKPHOS" in the last 168 hours.  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  She presented with  worsening abscess burden, and was taken to the OR on 8/1 for laminectomy and washout.  Overall she is doing well.  - mobilize - pain control - DVT prophylaxis okay  - PTOT -HV removed 8/5 - will follow up outpatient for staple removal  Manning Charity PA-C Department of Neurosurgery

## 2022-10-03 NOTE — Consult Note (Addendum)
Pharmacy Antibiotic Note  Christy Ray is a 67 y.o. female admitted on 09/23/2022 with sepsis and a hx to back pain persisting for several weeks. CT of abdomen showed epidural abscess. Aspiration of R paraspinal abscess with placement of drain #1 (7/26), culture showed MRSA. Left psoas abscess drain #2 placement, gram positive cocci in clusters/MRSA (8/1_7/30). Pharmacy has been consulted to dose vancomycin.  Day 11 of antibiotics. Last trough collected 8/1 resulted 12.  Vancomycin trough  8/5 at 11:39 = 15 mcg/ml on 1gm IV q12h (prev dose given 8/5  at 01:23   Plan: Continue vancomycin 1000 mg IV Q12H  Adjust administration times for delay in getting 8/5 noon dose Continue to monitor renal function and culture results. Per ID plan for 6-8 weeks of IV therapy then transition to PO for an to be determined period Await OPAT orders Plan for PICC 8/6 and discharge home  Height: 5\' 5"  (165.1 cm) Weight: 59.4 kg (130 lb 15.3 oz) IBW/kg (Calculated) : 57  Temp (24hrs), Avg:98.6 F (37 C), Min:98.1 F (36.7 C), Max:99.2 F (37.3 C)  Recent Labs  Lab 09/27/22 0414 09/28/22 0438 09/29/22 1312 09/30/22 0343 10/02/22 0615 10/03/22 0741 10/03/22 1139  WBC 10.1 9.3  --  12.6*  --  7.3  --   CREATININE  --  0.48 0.61 0.49 0.51 0.61  --   VANCOTROUGH  --   --  12*  --   --   --  15    Estimated Creatinine Clearance: 59.7 mL/min (by C-G formula based on SCr of 0.61 mg/dL).    Allergies  Allergen Reactions   Sulfa Antibiotics Rash    Antimicrobials this admission: Cefepime 7/26 >> 7/29 Metronidazole 7/26 >> 7/29 Vancomycin 7/26 >>   Dose adjustments this admission: 7/27 - 7/29 Vancomycin 1250 mg IV daily   Microbiology results: 7/26 BCx: NGTD 7/26 Abscess Cx: MRSA 7/30 Organism ID, MRSA 8/1 Aerobic/Anaerobic Abscess Cx: GPC in clusters  Thank you for allowing pharmacy to be a part of this patient's care.  Juliette Alcide, PharmD, BCPS, BCIDP Work Cell:  [phone] 10/03/2022 4:44 PM

## 2022-10-03 NOTE — Treatment Plan (Signed)
Diagnosis: MRSA paraspinal, epidural and psoas abscesses Baseline Creatinine <1 Cr cl 59.7   Allergies  Allergen Reactions   Sulfa Antibiotics Rash    OPAT Orders Discharge antibiotics: Vancomycin 1 gram IV every 12 hours Per pharmacy protocol  Aim for Vancomycin trough 15-20 (unless otherwise indicated) Duration: 6 weeks End Date: 11/09/22  [facility] Care Per Protocol:  Labs weekly on every Monday while on IV antibiotics: X__ CBC with differential X__ CMP _X_ CRP _X_ ESR X__ Vancomycin trough Labs weekly on every Thursday while on IV vancomycin X BMP X__ Vancomycin trough   __ Please pull PIC at completion of IV antibiotics   Fax weekly lab results  promptly to [phone]  Clinic Follow Up Appt:with Dr.- 10/27/22 at 9.30 am   Call [phone] with any questions

## 2022-10-03 NOTE — Consult Note (Signed)
PHARMACY CONSULT NOTE FOR:  OUTPATIENT  PARENTERAL ANTIBIOTIC THERAPY (OPAT)  Indication: MRSA paraspinal, epidural and psoas abscesses  Regimen: Vancomycin 1000 mg IV q12H End date: 11/09/2022  IV antibiotic discharge orders are pended. To discharging provider:  please sign these orders via discharge navigator,  Select New Orders & click on the button choice - Manage This Unsigned Work.     Thank you for allowing pharmacy to be a part of this patient's care.  Will M. Dareen Piano, PharmD Clinical Pharmacist 10/03/2022 5:51 PM

## 2022-10-03 NOTE — Plan of Care (Signed)
  Problem: Fluid Volume: Goal: Hemodynamic stability will improve Outcome: Progressing   Problem: Clinical Measurements: Goal: Diagnostic test results will improve Outcome: Progressing Goal: Signs and symptoms of infection will decrease Outcome: Progressing   Problem: Respiratory: Goal: Ability to maintain adequate ventilation will improve Outcome: Progressing   Problem: Education: Goal: Knowledge of General Education information will improve Description: Including pain rating scale, medication(s)/side effects and non-pharmacologic comfort measures Outcome: Progressing   Problem: Health Behavior/Discharge Planning: Goal: Ability to manage health-related needs will improve Outcome: Progressing   Problem: Clinical Measurements: Goal: Ability to maintain clinical measurements within normal limits will improve Outcome: Progressing Goal: Will remain free from infection Outcome: Progressing Goal: Diagnostic test results will improve Outcome: Progressing Goal: Respiratory complications will improve Outcome: Progressing Goal: Cardiovascular complication will be avoided Outcome: Progressing   Problem: Activity: Goal: Risk for activity intolerance will decrease Outcome: Progressing   Problem: Nutrition: Goal: Adequate nutrition will be maintained Outcome: Progressing   Problem: Coping: Goal: Level of anxiety will decrease Outcome: Progressing   Problem: Elimination: Goal: Will not experience complications related to bowel motility Outcome: Progressing Goal: Will not experience complications related to urinary retention Outcome: Progressing   Problem: Pain Managment: Goal: General experience of comfort will improve Outcome: Progressing   Problem: Safety: Goal: Ability to remain free from injury will improve Outcome: Progressing   Problem: Skin Integrity: Goal: Risk for impaired skin integrity will decrease Outcome: Progressing   Problem: Education: Goal: Ability  to verbalize activity precautions or restrictions will improve Outcome: Progressing Goal: Knowledge of the prescribed therapeutic regimen will improve Outcome: Progressing Goal: Understanding of discharge needs will improve Outcome: Progressing   Problem: Activity: Goal: Ability to avoid complications of mobility impairment will improve Outcome: Progressing Goal: Ability to tolerate increased activity will improve Outcome: Progressing Goal: Will remain free from falls Outcome: Progressing   Problem: Bowel/Gastric: Goal: Gastrointestinal status for postoperative course will improve Outcome: Progressing   Problem: Clinical Measurements: Goal: Ability to maintain clinical measurements within normal limits will improve Outcome: Progressing Goal: Postoperative complications will be avoided or minimized Outcome: Progressing Goal: Diagnostic test results will improve Outcome: Progressing   Problem: Pain Management: Goal: Pain level will decrease Outcome: Progressing   Problem: Skin Integrity: Goal: Will show signs of wound healing Outcome: Progressing   Problem: Health Behavior/Discharge Planning: Goal: Identification of resources available to assist in meeting health care needs will improve Outcome: Progressing   Problem: Bladder/Genitourinary: Goal: Urinary functional status for postoperative course will improve Outcome: Progressing   

## 2022-10-03 NOTE — Consult Note (Signed)
Pharmacy Antibiotic Note  Christy Ray is a 67 y.o. female admitted on 09/23/2022 with sepsis and a hx to back pain persisting for several weeks. CT of abdomen showed epidural abscess. Aspiration of R paraspinal abscess with placement of drain #1 (7/26), culture showed MRSA. Left psoas abscess drain #2 placement, gram positive cocci in clusters/MRSA (8/1_7/30). Pharmacy has been consulted to dose vancomycin.  Day 11 of antibiotics. Last trough collected 8/1 resulted 12.  Plan: Continue vancomycin 1000 mg IV Q12H  Goal AUC 400-550. Estimated AUC 417 Used Scr 0.8, TBW, Vd 0.72.  Continue to monitor renal function and culture results. Per ID plan for 6-8 weeks of IV therapy then transition to PO for an to be determined period  Height: 5\' 5"  (165.1 cm) Weight: 59.4 kg (130 lb 15.3 oz) IBW/kg (Calculated) : 57  Temp (24hrs), Avg:98.4 F (36.9 C), Min:98 F (36.7 C), Max:99.2 F (37.3 C)  Recent Labs  Lab 09/26/22 1104 09/26/22 1505 09/27/22 0414 09/28/22 0438 09/29/22 1312 09/30/22 0343 10/02/22 0615 10/03/22 0741  WBC  --   --  10.1 9.3  --  12.6*  --  7.3  CREATININE  --   --   --  0.48 0.61 0.49 0.51 0.61  VANCOTROUGH 5*  --   --   --  12*  --   --   --   VANCOPEAK  --  24*  --   --   --   --   --   --     Estimated Creatinine Clearance: 59.7 mL/min (by C-G formula based on SCr of 0.61 mg/dL).    Allergies  Allergen Reactions   Sulfa Antibiotics Rash    Antimicrobials this admission: Cefepime 7/26 >> 7/29 Metronidazole 7/26 >> 7/29 Vancomycin 7/26 >>   Dose adjustments this admission: 7/27 - 7/29 Vancomycin 1250 mg IV daily   Microbiology results: 7/26 BCx: NGTD 7/26 Abscess Cx: MRSA 7/30 Organism ID, MRSA 8/1 Aerobic/Anaerobic Abscess Cx: GPC in clusters  Thank you for allowing pharmacy to be a part of this patient's care.  Elliot Gurney, PharmD, BCPS Clinical Pharmacist  10/03/2022 10:39 AM

## 2022-10-03 NOTE — Progress Notes (Signed)
Date of Admission:  09/23/2022    is a 68 y.o. female  Principal Problem:   Epidural abscess Active Problems:   Gout   Hypokalemia   Essential hypertension   Dermatomyositis (HCC)   Psoas muscle abscess (HCC)   Sepsis (HCC)   Hyperlipidemia   Headache   Hyponatremia   Abscess of paraspinal muscles   Malnutrition of moderate degree   MRSA infection   GERD (gastroesophageal reflux disease)   Anemia of chronic disease    Subjective: No specific complaints All drains have been removed today Doing better   Medications:   amLODipine  5 mg Oral Daily   enoxaparin (LOVENOX) injection  40 mg Subcutaneous Q24H   esomeprazole  40 mg Oral BID   feeding supplement  237 mL Oral TID BM   folic acid  1 mg Oral Daily   losartan  100 mg Oral Daily   multivitamin with minerals  1 tablet Oral Daily   rosuvastatin  10 mg Oral QHS   senna-docusate  2 tablet Oral BID   sodium chloride flush  5 mL Intracatheter Q8H   sodium chloride flush  5 mL Intracatheter Q8H    Objective: Vital signs in last 24 hours: Patient Vitals for the past 24 hrs:  BP Temp Temp src Pulse Resp SpO2  10/03/22 0801 131/75 -- -- 84 18 98 %  10/03/22 0534 (!) 142/84 99.2 F (37.3 C) Oral 91 16 98 %  10/02/22 1942 113/79 98.1 F (36.7 C) -- 80 18 98 %  10/02/22 1552 104/66 98 F (36.7 C) Oral 77 18 98 %    PHYSICAL EXAM:  General: Alert, cooperative, no distress,  Back:no drains Surgical dressing Lungs: b/l air entry. Heart: Regular rate and rhythm, no murmur, rub or gallop. Abdomen: Soft, non-tender,not distended. Bowel sounds normal. No masses Extremities: atraumatic, no cyanosis. No edema. No clubbing Skin: No rashes or lesions. Or bruising Lymph: Cervical, supraclavicular normal. Neurologic: Grossly non-focal  Lab Results    Latest Ref Rng & Units 10/03/2022    7:41 AM 09/30/2022    3:43 AM 09/28/2022    4:38 AM  CBC  WBC 4.0 - 10.5 K/uL 7.3  12.6  9.3   Hemoglobin 12.0 - 15.0 g/dL 9.6  9.0   9.9   Hematocrit 36.0 - 46.0 % 29.4  26.4  30.1   Platelets 150 - 400 K/uL 394  343  346        Latest Ref Rng & Units 10/03/2022    7:41 AM 10/02/2022    6:15 AM 09/30/2022    3:43 AM  CMP  Glucose 70 - 99 mg/dL 93   78   BUN 8 - 23 mg/dL 10   13   Creatinine 8.75 - 1.00 mg/dL 6.43  3.29  5.18   Sodium 135 - 145 mmol/L 136   139   Potassium 3.5 - 5.1 mmol/L 3.5   3.9   Chloride 98 - 111 mmol/L 105   110   CO2 22 - 32 mmol/L 22   22   Calcium 8.9 - 10.3 mg/dL 8.4   8.4       Microbiology: MRSA in 3 cultures Studies/Results: [place] EKG SITE RITE  Result Date: 10/03/2022 If Site Rite image not attached, placement could not be confirmed due to current cardiac rhythm.          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 Patient was treated at that time with 10 weeks of antibiotics   She presented  with back pain of 2 weeks duration    MRSA infection Extensive infection of the lumbar spine with bilateral paraspinal abscess, left psoas abscess, epidural abscess involving L3-L4 and L4-L5, facet joint septic arthritis on the left L3-L5 Left  paraspinal drain Left psoas drain   epidural abscess/phlegmon Underwent laminectomy l3-l4  and washout- also had wash out of the rt paraspinal abscess  on  09/29/22)  Continue vancomycin iV  q 12 As she received dapto last year and has a recurrent infection will do vanco though daptomycin is susceptible and MIC is 1 Will need for 6-8 weeks of IV  followed by PO antibiotic for a long time   She underwent imaging today and both drains removed as resolution of abscesses  Discussed the management with the patient and her husband

## 2022-10-03 NOTE — Progress Notes (Signed)
Progress Note   Patient: Christy Ray MWN:[id number] DOB: 1955/05/10 DOA: 09/23/2022     10 DOS: the patient was seen and examined on 10/03/2022   Brief hospital course: [name] is a 67 year old female with history of dermatomyositis, hypertension, hyperlipidemia, who presents to the emergency department for chief concerns of low back pain. Patient had a disseminated MRSA infection in 2023, involving left great toe septic arthritis, left psoas abscess, multilevel epidural abscess.  She had a lumbar laminectomy, completed 6 weeks IV antibiotics followed by oral Bactrim.  Bactrim was discontinued due to allergy. Patient was doing well until recently when he developed significant back pain. She has significant cytosis of 23.4, CRP 11.9.  Sed rate 60.  CT scan showed dorsal epidural abscess measuring 5.0 x 1.0 x 1.3 cm with associated bony erosion of the left L3-L4 facet joint suggestive of septic arthritis. Bilateral paraspinal and left psoas abscesses.  MRI of the lumbar spine showed large posterior paraspinal intramuscular abscess on the right and on the left as well as the left psoas muscle abscess.  Compression of the cauda equina nerve roots At L3-L4 secondary to space-occupying epidural abscess. Left L3-L4 and L4-L5 facet joint bone marrow edema and subtle erosive changes noted in the posterior margin of the left L3-L4 and L3 left L4-L5 facet joints consistent with septic arthritis and osteomyelitis.   Patient is seen by neurosurgery, ID, IR performed right paraspinal abscess aspiration, left paraspinal abscess drain on 7/26.  Patient is treated with vancomycin and cefepime.  Repeated CT scan on 7/29 improved left paraspinal abscess, still present over right paraspinal abscess and epidural abscess.    7/30.  Left psoas muscle abscess drained on 7/30. 7/31.  Neurosurgery plans on doing a spinal procedure for epidural abscess on 8/1. 8/1.  Neurosurgical procedure done for epidural  abscess wash out and drain placement. 8/2.  Feeling well after neurosurgical procedure yesterday.  Anxious to go home. 8/3 &8/4.  Patient having a little back discomfort but able to move around okay. 8/5.  Neurosurgical team took out the spinal drain.  Interventional reurology team to decide on psoas muscle drain depending on CT results.  Assessment and Plan: * Epidural abscess Neurosurgical procedure on 8/1 for epidural abscess removal and washout.  Continue vancomycin.  Spinal culture growing MRSA.  Neurosurgical team remove the drain today.  Sepsis (HCC) MRSA infection.  Present on admission with epidural abscess and left psoas muscle abscess, leukocytosis and tachycardia.  Status post drain placements by interventional radiology and neurosurgery.  Continue vancomycin  Psoas muscle abscess (HCC) Status post drain placement by interventional radiology on 7/30.  MRSA infection.  Case discussed with IR and a repeat CT scan was done today that showed improvement of the abscesses.  IR team to determine what to do with the drains today.  Hyperlipidemia On Crestor  Dermatomyositis (HCC) Follow-up as outpatient  Essential hypertension Continue Norvasc and Cozaar.  Pulmonary nodules Radiology not recommending any follow-up.  Anemia of chronic disease Last hemoglobin 9.6  GERD (gastroesophageal reflux disease) Patient to bring in her own Nexium.  Malnutrition of moderate degree Continue supplements  Hyponatremia Sodium normalized  Hypokalemia Replaced        Subjective: Patient feels better after the spinal drain was removed today.  Still having some discomfort from the other drains.  Interested in getting out of the hospital soon as possible.  Admitted with MRSA sepsis  Physical Exam: Vitals:   10/02/22 1552 10/02/22 1942 10/03/22 0534 10/03/22  0801  BP: 104/66 113/79 (!) 142/84 131/75  Pulse: 77 80 91 84  Resp: 18 18 16 18   Temp: 98 F (36.7 C) 98.1 F (36.7 C)  99.2 F (37.3 C)   TempSrc: Oral  Oral   SpO2: 98% 98% 98% 98%  Weight:      Height:       Physical Exam HENT:     Head: Normocephalic.     Mouth/Throat:     Pharynx: No oropharyngeal exudate.  Eyes:     General: Lids are normal.     Conjunctiva/sclera: Conjunctivae normal.  Cardiovascular:     Rate and Rhythm: Normal rate and regular rhythm.     Heart sounds: Normal heart sounds, S1 normal and S2 normal.  Pulmonary:     Breath sounds: No decreased breath sounds, wheezing, rhonchi or rales.  Abdominal:     Palpations: Abdomen is soft.     Tenderness: There is no abdominal tenderness.  Musculoskeletal:     Right lower leg: No swelling.     Left lower leg: No swelling.  Skin:    General: Skin is warm.     Findings: No rash.  Neurological:     Mental Status: She is alert and oriented to person, place, and time.     Comments: Able to straight leg raise bilaterally.     Data Reviewed: CT scan showing well-positioned left paraspinal percutaneous abscess drains with resolution of previously demonstrated abscesses.  Pericardial and small left pleural effusion, aortic atherosclerosis, multiple pulmonary nodules largest 6 mm.  Creatinine 0.61, hemoglobin 9.6, white blood cell count 7.3  Disposition: Status is: Inpatient Remains inpatient appropriate because: Neurosurgery took out their drain today.  Interventional radiology repeated a CT scan to decide what to do with their drains.  PICC line ordered.  Planned Discharge Destination: Home with Home Health    Time spent: 28 minutes Case discussed with neurosurgery, infectious disease and interventional radiology.  Author: Alford Highland, MD 10/03/2022 1:01 PM  For on call review [email or web address].

## 2022-10-04 DIAGNOSIS — G062 Extradural and subdural abscess, unspecified: Secondary | ICD-10-CM | POA: Diagnosis not present

## 2022-10-04 DIAGNOSIS — K6812 Psoas muscle abscess: Secondary | ICD-10-CM | POA: Diagnosis not present

## 2022-10-04 DIAGNOSIS — E785 Hyperlipidemia, unspecified: Secondary | ICD-10-CM | POA: Diagnosis not present

## 2022-10-04 DIAGNOSIS — A4102 Sepsis due to Methicillin resistant Staphylococcus aureus: Secondary | ICD-10-CM | POA: Diagnosis not present

## 2022-10-04 LAB — AEROBIC/ANAEROBIC CULTURE W GRAM STAIN (SURGICAL/DEEP WOUND): Gram Stain: NONE SEEN

## 2022-10-04 MED ORDER — ENSURE ENLIVE PO LIQD: 237.0000 mL | Freq: Three times a day (TID) | ORAL | 0 refills | Status: DC

## 2022-10-04 MED ORDER — POLYETHYLENE GLYCOL 3350 17 G PO PACK: 17.0000 g | PACK | Freq: Every day | ORAL | 0 refills | Status: DC | PRN

## 2022-10-04 MED ORDER — SODIUM CHLORIDE 0.9% FLUSH: 10.0000 mL | INTRAVENOUS | Status: DC | PRN

## 2022-10-04 MED ORDER — SODIUM CHLORIDE 0.9% FLUSH: 10.0000 mL | Freq: Two times a day (BID) | INTRAVENOUS | 0 refills | Status: AC

## 2022-10-04 MED ORDER — SODIUM CHLORIDE 0.9% FLUSH: 10.0000 mL | Freq: Two times a day (BID) | INTRAVENOUS | Status: DC

## 2022-10-04 MED ORDER — CHLORHEXIDINE GLUCONATE CLOTH 2 % EX PADS: 6.0000 | MEDICATED_PAD | Freq: Every day | CUTANEOUS | Status: DC

## 2022-10-04 MED ORDER — ESOMEPRAZOLE MAGNESIUM 40 MG PO CPDR: 40.0000 mg | DELAYED_RELEASE_CAPSULE | Freq: Two times a day (BID) | ORAL | Status: AC

## 2022-10-04 MED ORDER — VANCOMYCIN IV (FOR PTA / DISCHARGE USE ONLY): 1000.0000 mg | Freq: Two times a day (BID) | INTRAVENOUS | 0 refills | Status: AC

## 2022-10-04 MED ORDER — ACETAMINOPHEN 325 MG PO TABS: 650.0000 mg | ORAL_TABLET | Freq: Four times a day (QID) | ORAL | Status: AC | PRN

## 2022-10-04 NOTE — Discharge Summary (Signed)
Physician Discharge Summary   Patient: Christy Ray MRN: [id number] DOB: September 22, 1955  Admit date:     09/23/2022  Discharge date: 10/04/22  Discharge Physician: Alford Highland   PCP: Ethelda Chick, MD   Recommendations at discharge:   Follow-up PCP 5 days Follow-up neurosurgery Follow-up infectious disease specialist  Discharge Diagnoses: Principal Problem:   Epidural abscess Active Problems:   Psoas muscle abscess (HCC)   Sepsis (HCC)   Hyperlipidemia   Essential hypertension   Dermatomyositis (HCC)   Gout   Hypokalemia   Headache   Hyponatremia   Abscess of paraspinal muscles   Malnutrition of moderate degree   MRSA infection   GERD (gastroesophageal reflux disease)   Anemia of chronic disease   Pulmonary nodules    Hospital Course: [name] is a 67 year old female with history of dermatomyositis, hypertension, hyperlipidemia, who presents to the emergency department for chief concerns of low back pain. Patient had a disseminated MRSA infection in 2023, involving left great toe septic arthritis, left psoas abscess, multilevel epidural abscess.  She had a lumbar laminectomy, completed 6 weeks IV antibiotics followed by oral Bactrim.  Bactrim was discontinued due to allergy. Patient was doing well until recently when he developed significant back pain. She has significant cytosis of 23.4, CRP 11.9.  Sed rate 60.  CT scan showed dorsal epidural abscess measuring 5.0 x 1.0 x 1.3 cm with associated bony erosion of the left L3-L4 facet joint suggestive of septic arthritis. Bilateral paraspinal and left psoas abscesses.  MRI of the lumbar spine showed large posterior paraspinal intramuscular abscess on the right and on the left as well as the left psoas muscle abscess.  Compression of the cauda equina nerve roots At L3-L4 secondary to space-occupying epidural abscess. Left L3-L4 and L4-L5 facet joint bone marrow edema and subtle erosive changes noted in the  posterior margin of the left L3-L4 and L3 left L4-L5 facet joints consistent with septic arthritis and osteomyelitis.   Patient is seen by neurosurgery, ID, IR performed right paraspinal abscess aspiration, left paraspinal abscess drain on 7/26.  Patient is treated with vancomycin and cefepime.  Repeated CT scan on 7/29 improved left paraspinal abscess, still present over right paraspinal abscess and epidural abscess.    7/30.  Left psoas muscle abscess drained on 7/30. 7/31.  Neurosurgery plans on doing a spinal procedure for epidural abscess on 8/1. 8/1.  Neurosurgical procedure done for epidural abscess wash out and drain placement. 8/2.  Feeling well after neurosurgical procedure yesterday.  Anxious to go home. 8/3 &8/4.  Patient having a little back discomfort but able to move around okay. 8/5.  Neurosurgical team took out the spinal drain.  Interventional reurology team took out the psoas muscle drains with CT scan showing resolution of abscess. 8/6.  PICC line placed.  Hemoglobin drifted down to 8.0 but consistent with anemia of chronic disease.  No signs of bleeding.  Patient received her morning dose of vancomycin and will receive the evening dose of vancomycin with home health bring out the medications later today.  Assessment and Plan: * Epidural abscess Neurosurgical procedure on 8/1 for epidural abscess removal and washout.  Continue vancomycin.  Spinal culture growing MRSA.  Neurosurgical team remove the drain on 8/5.  Vancomycin to be given through 9/11.  Sepsis (HCC) MRSA infection.  Present on admission with epidural abscess and left psoas muscle abscess, leukocytosis and tachycardia.  Status post drain placements by interventional radiology and neurosurgery and removal of drains  on 8/5.  Continue vancomycin  Psoas muscle abscess (HCC) Status post drain placement by interventional radiology on 7/30.  MRSA infection.  Case discussed with IR and a repeat CT scan was done on 8/5  that showed improvement of the abscesses.  Interventional radiology team remove the drains on 8/5.  Hyperlipidemia On Crestor  Dermatomyositis (HCC) Follow-up as outpatient  Essential hypertension Continue Norvasc and Cozaar.  Pulmonary nodules Radiology not recommending any follow-up.  Anemia of chronic disease Last hemoglobin 8.0.  Continue to monitor as outpatient.  GERD (gastroesophageal reflux disease) Patient to bring in her own Nexium.  Malnutrition of moderate degree Continue supplements  Hyponatremia Sodium normalized  Hypokalemia Replaced         Consultants: Infectious disease, neurosurgery, interventional radiology Procedures performed: 2 left psoas muscle drains placed and then removed, epidural abscess surgery and washout with drain placement and then drain removal Disposition: Home health Diet recommendation:  Regular diet DISCHARGE MEDICATION: Allergies as of 10/04/2022       Reactions   Sulfa Antibiotics Rash        Medication List     STOP taking these medications    meloxicam 15 MG tablet Commonly known as: MOBIC       TAKE these medications    acetaminophen 325 MG tablet Commonly known as: TYLENOL Take 2 tablets (650 mg total) by mouth every 6 (six) hours as needed for mild pain.   acyclovir 400 MG tablet Commonly known as: ZOVIRAX Take 400 mg by mouth 3 (three) times daily.  TAKE 1 TABLET (400 MG TOTAL) BY MOUTH 3 (THREE) TIMES DAILY FOR 5 DAYS AS NEEDED FOR OUTBREAK   amLODipine 5 MG tablet Commonly known as: NORVASC Take 1 tablet by mouth daily.   augmented betamethasone dipropionate 0.05 % cream Commonly known as: DIPROLENE-AF Apply 1 application  topically daily as needed.   esomeprazole 40 MG capsule Commonly known as: NEXIUM Take 1 capsule (40 mg total) by mouth 2 (two) times daily.   feeding supplement Liqd Take 237 mLs by mouth 3 (three) times daily between meals.   folic acid 1 MG tablet Commonly known  as: FOLVITE Take 1 mg by mouth daily.   losartan 100 MG tablet Commonly known as: COZAAR Take 1 tablet (100 mg total) by mouth daily.   polyethylene glycol 17 g packet Commonly known as: MIRALAX / GLYCOLAX Take 17 g by mouth daily as needed for moderate constipation.   rosuvastatin 10 MG tablet Commonly known as: CRESTOR Take 10 mg by mouth daily.   senna-docusate 8.6-50 MG tablet Commonly known as: Senokot-S Take 2 tablets by mouth 2 (two) times daily as needed for mild constipation.   sodium chloride flush 0.9 % Soln Commonly known as: NS Inject 10 mLs into the vein every 12 (twelve) hours.   vancomycin IVPB Inject 1,000 mg into the vein every 12 (twelve) hours. Indication:  MRSA paraspinal, epidural and psoas abscesses First Dose: Yes Last Day of Therapy:  11/09/2022 Labs - Sunday/Monday:  CBC/D, CMP, CRP, ESR, vancomycin trough Labs - Thursday:  BMP and vancomycin trough  Fax weekly lab results  promptly to [phone] Clinic Follow Up Appt:with Dr.Ravishankar- 10/27/22 at 9.30 am Call [phone] with any questions Please pull PIC at completion of IV antibiotics  Method of administration:Elastomeric Method of administration may be changed at the discretion of the patient and/or caregiver's ability to self-administer the medication ordered.  Discharge Care Instructions  (From admission, onward)           Start     Ordered   10/04/22 0000  Change dressing on IV access line weekly and PRN  (Home infusion instructions - [facility] )        10/04/22 0857            Follow-up Information     Ethelda Chick, MD Follow up in 5 day(s).   Specialty: Family Medicine Contact information: [address] [place] [address] [phone]         Lynn Ito, MD Follow up.   Specialty: Infectious Diseases Why: keep appointment on 29th Contact information: [address] [place]  [address] [phone]         Loreen Freud, MD Follow up.   Specialty: Neurosurgery Why: keep appt on 14th Contact information: [address] [place] [address] [phone]                Discharge Exam: Ceasar Mons Weights   09/23/22 1004  Weight: 59.4 kg   Physical Exam HENT:     Head: Normocephalic.     Mouth/Throat:     Pharynx: No oropharyngeal exudate.  Eyes:     General: Lids are normal.     Conjunctiva/sclera: Conjunctivae normal.  Cardiovascular:     Rate and Rhythm: Normal rate and regular rhythm.     Heart sounds: Normal heart sounds, S1 normal and S2 normal.  Pulmonary:     Breath sounds: No decreased breath sounds, wheezing, rhonchi or rales.  Abdominal:     Palpations: Abdomen is soft.     Tenderness: There is no abdominal tenderness.  Musculoskeletal:     Right lower leg: No swelling.     Left lower leg: No swelling.  Skin:    General: Skin is warm.     Findings: No rash.  Neurological:     Mental Status: She is alert and oriented to person, place, and time.     Comments: Able to straight leg raise bilaterally.      Condition at discharge: stable  The results of significant diagnostics from this hospitalization (including imaging, microbiology, ancillary and laboratory) are listed below for reference.   Imaging Studies: CT ABDOMEN PELVIS W CONTRAST  Result Date: 10/03/2022 CLINICAL DATA:  Intra-abdominal abscess Evaluate drains and abscess EXAM: CT ABDOMEN AND PELVIS WITH CONTRAST TECHNIQUE: Multidetector CT imaging of the abdomen and pelvis was performed using the standard protocol following bolus administration of intravenous contrast. RADIATION DOSE REDUCTION: This exam was performed according to the departmental dose-optimization program which includes automated exposure control, adjustment of the mA and/or kV according to patient size and/or use of iterative reconstruction technique. CONTRAST:  OMNIPAQUE IOHEXOL  300 MG/ML  SOLN COMPARISON:  IR CT, 09/27/2022.  CT AP, 09/26/2022 and 09/23/2022. FINDINGS: Lower chest: Solid, sub-6 mm (0.5 cm) RIGHT posterior basilar pulmonary nodule. Small volume LEFT pleural effusion and adjacent dependent LEFT basilar atelectasis. Pericardial effusion, unchanged from recent comparison. Hepatobiliary: No focal liver abnormality is seen. No gallstones, gallbladder wall thickening, or biliary dilatation. Pancreas: No pancreatic ductal dilatation or surrounding inflammatory changes. Spleen: Normal in size without focal abnormality. Adrenals/Urinary Tract: Adrenal glands are unremarkable. Kidneys are normal, without renal calculi, focal lesion, or hydronephrosis. Bladder is unremarkable. Stomach/Bowel: Stomach is within normal limits. Appendix is not definitively visualized. Contrast opacification of colon, previously ingested. No evidence of bowel wall thickening, distention,  or inflammatory changes. Vascular/Lymphatic: Aortoiliac atherosclerosis without aneurysmal dilatation. No enlarged abdominal or pelvic lymph nodes. Reproductive: Hysterectomy.  No adnexal mass. Other: No abdominal wall hernia or abnormality. No abdominopelvic ascites. Stable positioning of LEFT paraspinous percutaneous abscess drains within the psoas and subcutaneous/superficial back. Resolution of prior abscesses, with trace residual rim enhancing collection measuring approximately 2.5 x 0.5 cm at the LEFT superficial paraspinous musculature. Musculoskeletal: Postsurgical changes of L4 posterior decompression with overlying cutaneous staples. No interval osseous findings. IMPRESSION: Since CT AP dated 09/26/2022; 1. Well-positioned LEFT paraspinous percutaneous abscess drains with resolution of previously-demonstrated abscesses. 2. No significant residual or enlarging abscess. No additional superimposed acute abdominopelvic process. 3. Pericardial effusion and small LEFT pleural effusion. Aortic Atherosclerosis  (ICD10-I70.0). One hundred thirty additional incidental, chronic and senescent findings as above. 4. Multiple pulmonary nodules. Most significant: Sub-6 mm RIGHT solid pulmonary nodule. Per Fleischner Society Guidelines, no routine follow-up imaging is recommended. These guidelines do not apply to immunocompromised patients and patients with cancer. Follow up in patients with significant comorbidities as clinically warranted. For lung cancer screening, adhere to Lung-RADS guidelines. Reference: Radiology. 2017; 284(1):228-43. Roanna Banning, MD [facility] Radiology Electronically Signed   By: Roanna Banning M.D.   On: 10/03/2022 12:35   [place] EKG SITE RITE  Result Date: 10/03/2022 If Site Rite image not attached, placement could not be confirmed due to current cardiac rhythm.  DG Lumbar Spine 2-3 Views  Result Date: 09/29/2022 CLINICAL DATA:  [id number] Surgery, elective [id number] EXAM: LUMBAR SPINE - 2-3 VIEW COMPARISON:  September 23, 2022 FINDINGS: Spot fluoroscopy image was obtained for surgical planning purposes. This demonstrates placement of metallic hardware overlying the approximate L3-4 level and L5 vertebral body level. Surgical drains. Time: 2 seconds Dose: 0.68 mGy Please reference procedure report for further details. IMPRESSION: Spot fluoroscopy image for surgical planning purposes. Electronically Signed   By: Meda Klinefelter M.D.   On: 09/29/2022 14:21   DG C-Arm 1-60 Min-No Report  Result Date: 09/29/2022 Fluoroscopy was utilized by the requesting physician.  No radiographic interpretation.   CT GUIDED PERITONEAL/RETROPERITONEAL FLUID DRAIN BY PERC CATH  Result Date: 09/27/2022 INDICATION: 67 year old female with a history of psoas abscess referred for drainage EXAM: CT-GUIDED DRAINAGE OF PSOAS ABSCESS TECHNIQUE: Multidetector CT imaging of the pelvis was performed following the standard protocol without IV contrast. RADIATION DOSE REDUCTION: This exam  was performed according to the departmental dose-optimization program which includes automated exposure control, adjustment of the mA and/or kV according to patient size and/or use of iterative reconstruction technique. MEDICATIONS: The patient is currently admitted to the hospital and receiving intravenous antibiotics. The antibiotics were administered within an appropriate time frame prior to the initiation of the procedure. ANESTHESIA/SEDATION: Moderate (conscious) sedation was employed during this procedure. A total of Versed 1.0 mg and Fentanyl 50 mcg was administered intravenously by the radiology nurse. Total intra-service moderate Sedation Time: 16 minutes. The patient's level of consciousness and vital signs were monitored continuously by radiology nursing throughout the procedure under my direct supervision. COMPLICATIONS: None PROCEDURE: Informed written consent was obtained from the patient after a thorough discussion of the procedural risks, benefits and alternatives. All questions were addressed. Maximal Sterile Barrier Technique was utilized including caps, mask, sterile gowns, sterile gloves, sterile drape, hand hygiene and skin antiseptic. A timeout was performed prior to the initiation of the procedure. Patient was position prone on the CT gantry table. Scout CT acquired for planning purposes. The patient was prepped and draped  in the usual sterile fashion. 1% lidocaine was used for local anesthesia. Using CT guidance, trocar needle was placed from left flank approach into the left psoas abscess. Modified Seldinger technique was then used to place a 10 [place] drain into the abscess. Approximately 30 cc of frankly purulent material aspirated. Sample was sent for culture. Drain was sutured in position attached to bulb suction. Final images were stored. Patient tolerated the procedure well and remained hemodynamically stable throughout. No complications were encountered and no significant blood loss.  IMPRESSION: Status post CT-guided drainage of left psoas abscess. Signed, Yvone Neu. Miachel Roux, RPVI [facility] Radiology Electronically Signed   By: Gilmer Mor D.O.   On: 09/27/2022 13:11   CT ABDOMEN PELVIS WO CONTRAST  Result Date: 09/26/2022 CLINICAL DATA:  Abdominal pain, drain placement EXAM: CT ABDOMEN AND PELVIS WITHOUT CONTRAST TECHNIQUE: Multidetector CT imaging of the abdomen and pelvis was performed following the standard protocol without IV contrast. RADIATION DOSE REDUCTION: This exam was performed according to the departmental dose-optimization program which includes automated exposure control, adjustment of the mA and/or kV according to patient size and/or use of iterative reconstruction technique. COMPARISON:  09/23/2022 FINDINGS: Lower chest: Small pericardial effusion slightly increased since prior study. New trace left pleural effusion. Hepatobiliary: Unremarkable unenhanced appearance of the liver and gallbladder. Pancreas: Unremarkable unenhanced appearance. Spleen: Unremarkable unenhanced appearance. Adrenals/Urinary Tract: No urinary tract calculi or obstructive uropathy within either kidney. The adrenals are stable. Bladder is unremarkable. Stomach/Bowel: No bowel obstruction or ileus. Normal appendix right lower quadrant. No bowel wall thickening or inflammatory change. Vascular/Lymphatic: Aortic atherosclerosis. No enlarged abdominal or pelvic lymph nodes. Reproductive: Uterus and bilateral adnexa are unremarkable. Other: No free fluid or free intraperitoneal gas. No abdominal wall hernia. Musculoskeletal: Left psoas abscess unchanged measuring 3.2 x 3.2 cm reference image 37/2. Evaluation is limited without intravenous contrast. Fluid collections within the paraspinal musculatures seen previously have diminished after aspiration and drain placement. Indwelling pigtail drainage catheter within the left paraspinous musculature at  the L3 level, with no remaining fluid collection at that location. A small residual fluid collection in the right paraspinous musculature is visualized measuring 1.9 x 0.9 cm reference image 33/2. The epidural abscess seen within the dorsal aspect of the thecal sac at the L4 level previously is less well visualized on this study without contrast, but is likely stable measuring 12 x 8 mm reference image 31/2. Erosive changes within the bilateral L3-4 and L4-5 facet joints, greatest on the left at L3-4, consistent with septic arthritis and osteomyelitis seen on prior MRI. Reconstructed images demonstrate no additional findings. IMPRESSION: 1. Resolution of the left paraspinous intramuscular abscess after indwelling drain placement as above. 2. Marked decrease in the right paraspinous intramuscular abscess after aspiration. 3. Stable left sella S abscess. 4. Stable epidural abscess centered at the L4 level. 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 5. Small pericardial effusion, slightly increased since prior study. 6. New trace left pleural effusion. Electronically Signed   By: Sharlet Salina M.D.   On: 09/26/2022 21:03   [place] Abscess Drain  Result Date: 09/23/2022 INDICATION: Multi focal paraspinal abscesses EXAM: 1. Ultrasound-guided aspiration of right paraspinal abscess 2. Ultrasound-guided drain placement into left paraspinal abscess MEDICATIONS: Documented in the EMR ANESTHESIA/SEDATION: Local analgesia COMPLICATIONS: None immediate. PROCEDURE: Informed written consent was obtained from the patient after a thorough discussion of the procedural risks, benefits and alternatives. All questions were  addressed. Maximal Sterile Barrier Technique was utilized including caps, mask, sterile gowns, sterile gloves, sterile drape, hand hygiene and skin antiseptic. A timeout was performed prior to the initiation of the procedure. Patient was placed prone on the  exam table. Ultrasound was used to evaluate the subcutaneous tissues of the lower back, identifying both the right and left paraspinal collections as seen on both recent CT and MRI imaging. Plan was made to proceed with right paraspinal fluid aspiration and left paraspinal collection drain placement. Skin entry sites were marked, and the overlying skin was prepped and draped in the standard sterile fashion. Local analgesia was obtained with 1% lidocaine. Attention was first turned to the right paraspinal collection, which was relatively smaller in size. Under ultrasound guidance, a 19 gauge Yueh catheter was advanced directly into this collection. Subsequently, approximately 40 mL of thick green purulent material was aspirated. Postprocedure imaging demonstrated near-complete decompression of this abscess. Needle was withdrawn, and a clean dressing was placed. Attention was then turned to the left paraspinal collection, the larger of the collections. Imaging demonstrated a small channel communication between both this more superficial collection in the deeper left psoas abscess. Given this, the decision was made to attempt decompression of both the paraspinal collection in the left psoas collection via a single drain placed in the more superficial component. Under ultrasound guidance, a 12 [place] multipurpose drainage catheter was advanced into the left paraspinal collection using trocar technique. Location was confirmed with both ultrasound and return of additional thick green purulent material. Locking loop was formed, and the drainage catheter was secured to the skin using silk suture and a dressing. An additional 60 mL of thick green purulent material was aspirated. It was then attached to bulb suction. The patient tolerated all aspects of the procedure well without immediate complication. IMPRESSION: 1. Successful ultrasound-guided aspiration of right paraspinal abscess yielding 40 mL of thick green purulent  material. 2. Successful ultrasound-guided placement of a 12 French locking drainage catheter into the left paraspinal collection yielding an additional 60 mL of thick green purulent material. Drainage catheter placed to bulb suction. 3. Recommend flushing of percutaneous drainage catheter x3 daily with 10 mL normal saline. 4. Given thick nature of the abscess, the deeper left psoas collection may not adequately decompress via the left paraspinal drainage catheter despite the presence of a communication between the 2 collections. Recommend close CT follow-up in 3-5 days to evaluate for interval change. If the collection remains unchanged in size, this may warrant additional CT-guided placement of a drainage catheter into the left psoas collection. Electronically Signed   By: Olive Bass M.D.   On: 09/23/2022 16:05   [place] IMAGE GUIDED FLUID DRAIN BY CATHETER  Result Date: 09/23/2022 INDICATION: Multi focal paraspinal abscesses EXAM: 1. Ultrasound-guided aspiration of right paraspinal abscess 2. Ultrasound-guided drain placement into left paraspinal abscess MEDICATIONS: Documented in the EMR ANESTHESIA/SEDATION: Local analgesia COMPLICATIONS: None immediate. PROCEDURE: Informed written consent was obtained from the patient after a thorough discussion of the procedural risks, benefits and alternatives. All questions were addressed. Maximal Sterile Barrier Technique was utilized including caps, mask, sterile gowns, sterile gloves, sterile drape, hand hygiene and skin antiseptic. A timeout was performed prior to the initiation of the procedure. Patient was placed prone on the exam table. Ultrasound was used to evaluate the subcutaneous tissues of the lower back, identifying both the right and left paraspinal collections as seen on both recent CT and MRI imaging. Plan was made to proceed with right paraspinal  fluid aspiration and left paraspinal collection drain placement. Skin entry sites were marked, and the  overlying skin was prepped and draped in the standard sterile fashion. Local analgesia was obtained with 1% lidocaine. Attention was first turned to the right paraspinal collection, which was relatively smaller in size. Under ultrasound guidance, a 19 gauge Yueh catheter was advanced directly into this collection. Subsequently, approximately 40 mL of thick green purulent material was aspirated. Postprocedure imaging demonstrated near-complete decompression of this abscess. Needle was withdrawn, and a clean dressing was placed. Attention was then turned to the left paraspinal collection, the larger of the collections. Imaging demonstrated a small channel communication between both this more superficial collection in the deeper left psoas abscess. Given this, the decision was made to attempt decompression of both the paraspinal collection in the left psoas collection via a single drain placed in the more superficial component. Under ultrasound guidance, a 12 [place] multipurpose drainage catheter was advanced into the left paraspinal collection using trocar technique. Location was confirmed with both ultrasound and return of additional thick green purulent material. Locking loop was formed, and the drainage catheter was secured to the skin using silk suture and a dressing. An additional 60 mL of thick green purulent material was aspirated. It was then attached to bulb suction. The patient tolerated all aspects of the procedure well without immediate complication. IMPRESSION: 1. Successful ultrasound-guided aspiration of right paraspinal abscess yielding 40 mL of thick green purulent material. 2. Successful ultrasound-guided placement of a 12 French locking drainage catheter into the left paraspinal collection yielding an additional 60 mL of thick green purulent material. Drainage catheter placed to bulb suction. 3. Recommend flushing of percutaneous drainage catheter x3 daily with 10 mL normal saline. 4. Given thick  nature of the abscess, the deeper left psoas collection may not adequately decompress via the left paraspinal drainage catheter despite the presence of a communication between the 2 collections. Recommend close CT follow-up in 3-5 days to evaluate for interval change. If the collection remains unchanged in size, this may warrant additional CT-guided placement of a drainage catheter into the left psoas collection. Electronically Signed   By: Olive Bass M.D.   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 CONTRAST:  6mL GADAVIST GADOBUTROL 1 MMOL/ML IV SOLN COMPARISON:  MRI 07/14/2021, CT 09/23/2022 FINDINGS: Segmentation:  Standard. Alignment:  Grade 1 anterolisthesis of L3 on L4 and L4 on L5. Vertebrae: Subchondral bone marrow edema associated with the left L3-L4 and L4-L5 facet joints and mild bone marrow edema within the left L3 and L4 pedicles. Subtle erosive changes noted along the posterior margins of the left L3-4 and left L4-5 facet joints. 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 Lumbar vertebral body heights are maintained without fracture. No evidence of discitis. No marrow replacing bone lesion. Conus medullaris and cauda equina: Conus extends to the L1-2 level. Conus appears normal. There is compression of the cauda equina nerve roots at the L3-4 level secondary to space-occupying epidural abscess. Large epidural abscess within the posterior epidural space spanning from the L3 level to the L5-S1 level measures 2.1 x 1.0 cm in maximal transverse dimension which is at the L4  vertebral body level (series 19, image 23) and spans a craniocaudal  length of approximately 8.0 cm (series 21, image 9). This has increased in size compared to the previous MRI. Paraspinal and other soft tissues: Large posterior paraspinal intramuscular abscesses within the lumbar region measuring 8.3 x 3.2 x 4.3 cm on the right and 12.0 x 4.3 x 4.8 cm on the left. Left psoas muscle abscess measuring 7.5 x 2.8 x 2.9 cm. Paraspinal muscle abscesses have increased since 2023. Disc levels: T12-L1: Mild disc bulge and facet hypertrophy. No foraminal or canal stenosis. L1-L2: Mild disc bulge and facet hypertrophy. No foraminal or canal stenosis. L2-L3: Minimal disc bulge and mild-moderate facet hypertrophy. No foraminal or canal stenosis. L3-L4: Disc bulge with bilateral facet arthropathy and posterior epidural abscess. Severe canal stenosis with severe left and mild right foraminal stenosis. The epidural abscess fills the left neural foramen at this level. L4-L5: Disc bulge with severe bilateral facet arthropathy and posterior epidural abscess. Moderate canal stenosis with right greater than left subarticular recess stenosis. Mild-to-moderate bilateral foraminal stenosis. L5-S1: No disc bulge. Bilateral facet arthropathy. No foraminal or canal stenosis. IMPRESSION: 1. 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 2. Large posterior epidural abscess spanning from the L3 level to the L5-S1 level measuring up to 2.1 x 1.0 cm transaxially and spanning 8.0 cm in length. This has increased in size compared to the previous MRI. There is associated severe canal stenosis at the L3-4 disc level and L4 vertebral body level. 3. Severe left foraminal stenosis at L3-4. The epidural abscess fills the left neural foramen at this level. 4. Large posterior paraspinal intramuscular abscesses within the lumbar region measuring 8.3 x 3.2 x 4.3 cm on the right and 12.0 x 4.3 x 4.8 cm on the left. Left psoas muscle abscess measuring 7.5  x 2.8 x 2.9 cm. Paraspinal muscle abscesses have increased in size since 2023. Electronically Signed   By: Duanne Guess D.O.   On: 09/23/2022 15:08   MR THORACIC SPINE W WO CONTRAST  Result Date: 09/23/2022 CLINICAL DATA:  Mid-back pain h/o of thoracic and lumbar epidural abscess EXAM: MRI THORACIC WITHOUT AND WITH CONTRAST TECHNIQUE: Multiplanar and multiecho pulse sequences of the thoracic spine were obtained without and with intravenous contrast. CONTRAST:  6mL GADAVIST GADOBUTROL 1 MMOL/ML IV SOLN COMPARISON:  MRI 07/14/2021 FINDINGS: Alignment:  Physiologic. Vertebrae: No fracture, evidence of discitis, or bone lesion. Cord: Normal signal and morphology. Previously seen mid to lower thoracic epidural fluid collection has resolved. No residual collection or abnormal postcontrast enhancement within the thoracic canal. Paraspinal and other soft tissues: Negative. Disc levels: T1-T2: Tiny disc protrusion. Mild facet hypertrophy. No foraminal or canal stenosis. T2-T3 through T5-T6: Mild facet hypertrophy. No significant canal or neural foraminal narrowing of these levels. T6-T7: Mild disc bulge and endplate spurring on the left. No foraminal or canal stenosis. T7-T8: Mild endplate spurring without foraminal or canal stenosis. T8-T9: Minimal disc bulge and endplate spurring with mild facet hypertrophy. No foraminal or canal stenosis. T9-T10: Bilateral facet hypertrophy without foraminal or canal stenosis. T10-T11: Mild disc bulge and endplate spurring with mild facet hypertrophy. No canal stenosis. Mild left foraminal stenosis. T11-T12: Mild disc bulge and endplate spurring with bilateral facet hypertrophy. No foraminal or canal stenosis. IMPRESSION: 1. Previously seen mid to lower thoracic epidural fluid collection has resolved. No residual collection or abnormal postcontrast enhancement within the thoracic canal. 2. Mild multilevel thoracic spondylosis. Mild  left foraminal stenosis at T10-T11. No canal  stenosis at any level. Electronically Signed   By: Duanne Guess D.O.   On: 09/23/2022 14:54   CT ABDOMEN PELVIS W CONTRAST  Result Date: 09/23/2022 CLINICAL DATA:  groin pain, back pain. history of psoas abscess and paraspinal muscle abscess. EXAM: CT ABDOMEN AND PELVIS WITH CONTRAST TECHNIQUE: Multidetector CT imaging of the abdomen and pelvis was performed using the standard protocol following bolus administration of intravenous contrast. RADIATION DOSE REDUCTION: This exam was performed according to the departmental dose-optimization program which includes automated exposure control, adjustment of the mA and/or kV according to patient size and/or use of iterative reconstruction technique. CONTRAST:  OMNIPAQUE IOHEXOL 300 MG/ML  SOLN COMPARISON:  CT abdomen/pelvis 07/29/2021. FINDINGS: Lower chest: Small pericardial effusion. Within the anterior mediastinum, there is a partially imaged, peripherally enhancing lesion, possibly representing an additional site of abscess. Hepatobiliary: No focal liver abnormality is seen. No gallstones, gallbladder wall thickening, or biliary dilatation. Pancreas: Unremarkable. No pancreatic ductal dilatation or surrounding inflammatory changes. Spleen: Normal in size without focal abnormality. Adrenals/Urinary Tract: Adrenal glands are unremarkable. Kidneys are normal, without renal calculi, focal lesion, or hydronephrosis. Bladder is unremarkable. Stomach/Bowel: Stomach is within normal limits. Normal appendix is visualized on coronal image 33 series 5. No evidence of bowel wall thickening, distention, or inflammatory changes. Vascular/Lymphatic: Aortic atherosclerosis. No enlarged abdominal or pelvic lymph nodes. Reproductive: Uterus and bilateral adnexa are unremarkable. Other: No abdominal wall hernia or abnormality. No abdominopelvic ascites. Musculoskeletal: Peripherally enhancing, dorsal epidural fluid collection, measuring up to 5.0 x 1.0 x 1.3 cm (axial image  35 series 2, sagittal image 57 series 6), consistent with epidural abscess. Associated bony erosion of the left L3-4 facet joint (axial image 36 series 2), suggestive of septic arthritis. Associated large bilateral paraspinal and left psoas abscesses. The left paraspinal abscess measures up to 11.3 x 4.5 cm in the sagittal plane (image 67 series 6). The right paraspinal abscess measures up to 8.5 x 3.7 cm in the sagittal plane (image 52 series 6). The left psoas abscess measures up to 6.4 x 2.8 cm in the sagittal plane (image 69 series 6). IMPRESSION: 1. Dorsal epidural abscess, measuring up to 5.0 x 1.0 x 1.3 cm. Associated bony erosion of the left L3-4 facet joint, suggestive of septic arthritis. 2. Associated large bilateral paraspinal and left psoas abscesses, as described above. 3. Partially imaged, peripherally enhancing lesion in the anterior mediastinum, possibly representing an additional site of abscess. Chest CT is recommended for further evaluation. 4. Small pericardial effusion. Aortic Atherosclerosis (ICD10-I70.0). Critical Value/emergent results were called by telephone at the time of interpretation on 09/23/2022 at 11:55 am to provider [facility] , who verbally acknowledged these results. Electronically Signed   By: Orvan Falconer M.D.   On: 09/23/2022 12:09    Microbiology: Results for orders placed or performed during the hospital encounter of 09/23/22  Minimum Inhibitory Conc. (1 Drug)     Status: Abnormal   Collection Time: 09/21/22  2:59 PM  Result Value Ref Range Status   Min Inhibitory Conc (1 Drug) Final report (A)  Corrected    Comment: (NOTE) Performed At: [facility] [address] [place], [place] [id number] Jolene Schimke MD WU:[id number] CORRECTED ON 08/03 AT 0636: PREVIOUSLY REPORTED AS Preliminary report    Source CRE PENDING  Incomplete  Blood Culture (routine x 2)     Status: None   Collection Time: 09/23/22 12:03 PM   Specimen: BLOOD  Result Value  Ref  Range Status   Specimen Description BLOOD BLOOD RIGHT ARM  Final   Special Requests   Final    BOTTLES DRAWN AEROBIC AND ANAEROBIC Blood Culture adequate volume   Culture   Final    NO GROWTH 5 DAYS Performed at [facility], [address] [place], [place] [address]    Report Status 09/28/2022 FINAL  Final  Blood Culture (routine x 2)     Status: None   Collection Time: 09/23/22 12:03 PM   Specimen: BLOOD  Result Value Ref Range Status   Specimen Description BLOOD BLOOD LEFT ARM  Final   Special Requests   Final    BOTTLES DRAWN AEROBIC AND ANAEROBIC Blood Culture results may not be optimal due to an excessive volume of blood received in culture bottles   Culture   Final    NO GROWTH 5 DAYS Performed at [facility], [address] [place], [place] [address]    Report Status 09/28/2022 FINAL  Final  Aerobic/Anaerobic Culture w Gram Stain (surgical/deep wound)     Status: None   Collection Time: 09/23/22  2:59 PM   Specimen: Abscess  Result Value Ref Range Status   Specimen Description   Final    ABSCESS Performed at [facility], [address] [place], [place] [address]    Special Requests PARASPINAL  Final   Gram Stain   Final    FEW WBC PRESENT, PREDOMINANTLY PMN FEW GRAM POSITIVE COCCI    Culture   Final    FEW METHICILLIN RESISTANT STAPHYLOCOCCUS AUREUS NO ANAEROBES ISOLATED SEE SEPARATE REPORT FOR DAPTOMYCIN MIC Performed at [facility], [address] [place], [place] [address]    Report Status 10/03/2022 FINAL  Final   Organism ID, Bacteria METHICILLIN RESISTANT STAPHYLOCOCCUS AUREUS  Final      Susceptibility   Methicillin resistant staphylococcus aureus - MIC*    CIPROFLOXACIN <=0.5 SENSITIVE Sensitive     ERYTHROMYCIN >=8 RESISTANT Resistant     GENTAMICIN <=0.5 SENSITIVE Sensitive     OXACILLIN >=4 RESISTANT Resistant     TETRACYCLINE <=1 SENSITIVE Sensitive     VANCOMYCIN 1 SENSITIVE Sensitive     TRIMETH/SULFA  <=10 SENSITIVE Sensitive     CLINDAMYCIN <=0.25 SENSITIVE Sensitive     RIFAMPIN <=0.5 SENSITIVE Sensitive     Inducible Clindamycin NEGATIVE Sensitive     LINEZOLID 2 SENSITIVE Sensitive     * FEW METHICILLIN RESISTANT STAPHYLOCOCCUS AUREUS  Culture, fungus without smear     Status: None (Preliminary result)   Collection Time: 09/23/22  2:59 PM   Specimen: Abscess  Result Value Ref Range Status   Specimen Description   Final    ABSCESS Performed at [facility], [address], [place], [place] [address]    Special Requests PARASPINAL  Final   Culture   Final    NO FUNGUS ISOLATED AFTER 11 DAYS Performed at [facility], [address] [place], [place] [address]    Report Status PENDING  Incomplete  Aerobic/Anaerobic Culture w Gram Stain (surgical/deep wound)     Status: None   Collection Time: 09/25/22  2:55 PM   Specimen: Abscess  Result Value Ref Range Status   Specimen Description   Final    ABSCESS Performed at [facility], [address], [place], [place] [address]    Special Requests   Final    peri spinal abscess Performed at [facility], [address]  [place], [place] [address]    Gram Stain   Final    NO WBC SEEN MODERATE GRAM POSITIVE COCCI IN CLUSTERS    Culture   Final    ABUNDANT METHICILLIN RESISTANT STAPHYLOCOCCUS AUREUS NO ANAEROBES ISOLATED Performed at [facility], [address], [place], [place] [address]    Report Status 10/02/2022 FINAL  Final   Organism ID, Bacteria METHICILLIN RESISTANT STAPHYLOCOCCUS AUREUS  Final      Susceptibility   Methicillin resistant staphylococcus aureus - MIC*    CIPROFLOXACIN <=0.5 SENSITIVE Sensitive     ERYTHROMYCIN >=8 RESISTANT Resistant     GENTAMICIN <=0.5 SENSITIVE Sensitive     OXACILLIN >=4 RESISTANT Resistant     TETRACYCLINE <=1 SENSITIVE Sensitive     VANCOMYCIN 1 SENSITIVE Sensitive     TRIMETH/SULFA <=10 SENSITIVE Sensitive     CLINDAMYCIN <=0.25  SENSITIVE Sensitive     RIFAMPIN <=0.5 SENSITIVE Sensitive     Inducible Clindamycin NEGATIVE Sensitive     LINEZOLID 2 SENSITIVE Sensitive     * ABUNDANT METHICILLIN RESISTANT STAPHYLOCOCCUS AUREUS  Aerobic/Anaerobic Culture w Gram Stain (surgical/deep wound)     Status: None   Collection Time: 09/27/22 12:45 PM   Specimen: Abscess  Result Value Ref Range Status   Specimen Description   Final    ABSCESS Performed at [facility], [address] [place], [place] [address]    Special Requests   Final    ABDOMEN Performed at [facility], [address] [place], [place] [address]    Gram Stain   Final    FEW WBC PRESENT, PREDOMINANTLY PMN FEW GRAM POSITIVE COCCI    Culture   Final    ABUNDANT METHICILLIN RESISTANT STAPHYLOCOCCUS AUREUS NO ANAEROBES ISOLATED Performed at [facility], [address], [place], [place] [address]    Report Status 10/02/2022 FINAL  Final   Organism ID, Bacteria METHICILLIN RESISTANT STAPHYLOCOCCUS AUREUS  Final      Susceptibility   Methicillin resistant staphylococcus aureus - MIC*    CIPROFLOXACIN <=0.5 SENSITIVE Sensitive     ERYTHROMYCIN >=8 RESISTANT Resistant     GENTAMICIN <=0.5 SENSITIVE Sensitive     OXACILLIN >=4 RESISTANT Resistant     TETRACYCLINE <=1 SENSITIVE Sensitive     VANCOMYCIN 1 SENSITIVE Sensitive     TRIMETH/SULFA <=10 SENSITIVE Sensitive     CLINDAMYCIN <=0.25 SENSITIVE Sensitive     RIFAMPIN <=0.5 SENSITIVE Sensitive     Inducible Clindamycin NEGATIVE Sensitive     LINEZOLID 2 SENSITIVE Sensitive     * ABUNDANT METHICILLIN RESISTANT STAPHYLOCOCCUS AUREUS  Aerobic/Anaerobic Culture w Gram Stain (surgical/deep wound)     Status: None   Collection Time: 09/29/22 10:06 AM   Specimen: Abscess  Result Value Ref Range Status   Specimen Description   Final    ABSCESS Performed at [facility], [address] [place], [place] [address]    Special Requests PARASPINAL  Final   Gram Stain    Final    NO WBC SEEN RARE GRAM POSITIVE COCCI IN CLUSTERS    Culture   Final    RARE METHICILLIN RESISTANT STAPHYLOCOCCUS AUREUS NO ANAEROBES ISOLATED Performed at [facility], [address] [place], [place] [address]    Report Status 10/04/2022 FINAL  Final   Organism ID, Bacteria METHICILLIN RESISTANT STAPHYLOCOCCUS AUREUS  Final      Susceptibility   Methicillin resistant staphylococcus aureus - MIC*    CIPROFLOXACIN <=0.5 SENSITIVE Sensitive  ERYTHROMYCIN >=8 RESISTANT Resistant     GENTAMICIN <=0.5 SENSITIVE Sensitive     OXACILLIN >=4 RESISTANT Resistant     TETRACYCLINE <=1 SENSITIVE Sensitive     VANCOMYCIN 1 SENSITIVE Sensitive     TRIMETH/SULFA <=10 SENSITIVE Sensitive     CLINDAMYCIN <=0.25 SENSITIVE Sensitive     RIFAMPIN <=0.5 SENSITIVE Sensitive     Inducible Clindamycin NEGATIVE Sensitive     LINEZOLID 2 SENSITIVE Sensitive     * RARE METHICILLIN RESISTANT STAPHYLOCOCCUS AUREUS    Labs: CBC: Recent Labs  Lab 09/28/22 0438 09/30/22 0343 10/03/22 0741 10/04/22 0429  WBC 9.3 12.6* 7.3 6.8  HGB 9.9* 9.0* 9.6* 8.1*  HCT 30.1* 26.4* 29.4* 24.1*  MCV 81.4 79.0* 82.1 80.1  PLT 346 343 394 367   Basic Metabolic Panel: Recent Labs  Lab 09/28/22 0438 09/29/22 1312 09/30/22 0343 10/02/22 0615 10/03/22 0741 10/04/22 0429  NA 134*  --  139  --  136 138  K 3.3*  --  3.9  --  3.5 3.5  CL 104  --  110  --  105 108  CO2 24  --  22  --  22 22  GLUCOSE 83  --  78  --  93 86  BUN 11  --  13  --  10 10  CREATININE 0.48 0.61 0.49 0.51 0.61 0.56  CALCIUM 8.0*  --  8.4*  --  8.4* 8.1*  MG 2.0  --   --   --   --   --    Liver Function Tests: No results for input(s): "AST", "ALT", "ALKPHOS", "BILITOT", "PROT", "ALBUMIN" in the last 168 hours. CBG: No results for input(s): "GLUCAP" in the last 168 hours.  Discharge time spent: greater than 30 minutes.  Signed: Alford Highland, MD [facility] 10/04/2022

## 2022-10-04 NOTE — Plan of Care (Signed)
Patient alert & oriented ambulates independently. Has PICC line placement left upper arm. Home medication returned, Education completed. Home health confirmed visiting for evening Vancomycin infusion. AVS reviewed and provided. All questions answered to patients satisfaction. Husband transporting for discharge.

## 2022-10-04 NOTE — TOC Transition Note (Signed)
Transition of Care [facility]) - CM/SW Discharge Note   Patient Details  Name: Christy Ray MRN: [id number] Date of Birth: [birth year]  [facility] CM/SW Contact:  Garret Reddish, RN Phone Number: 10/04/2022, 1:45 PM   Clinical Narrative:     Chart reviewed.  Noted that patient has orders for discharge today.    Patient has received PICC line today.  Pam with [facility] has provided IV infusion teaching at bedside today.  [facility] will provide home infusion medications at patient's home this afternoon. [facility] will start with Home Health RN on tomorrow.    I have informed Barbara Cower with [facility] that patient will be a discharge for today.    I have informed staff nurse of the above information.    Patient's husband will transport patient home today.     Final next level of care: Home w Home Health Services Barriers to Discharge: No Barriers Identified   Patient Goals and CMS Choice CMS Medicare.gov Compare Post Acute Care list provided to:: Patient Choice offered to / list presented to : Patient  Discharge Placement                    Name of family member notified: Christy Ray Patient and family notified of of transfer: 10/04/22  Discharge Plan and Services Additional resources added to the After Visit Summary for                            [facility] Arranged: RN [facility] Agency: [facility] ([facility]) ([facility] will provide IV antiobitics.) Date HH Agency Contacted: 10/04/22 Time HH Agency Contacted: 1000 Representative spoke with at [facility] Agency: Barbara Cower with [facility] and Pam with [facility]  Social Determinants of Health (SDOH) Interventions SDOH Screenings   Food Insecurity: Patient Unable To Answer (09/23/2022)  Housing: Patient Declined (09/23/2022)  Transportation Needs: Patient Declined (09/23/2022)  Utilities: Patient Declined (09/23/2022)  Depression (PHQ2-9): Low Risk  (10/12/2021)  Financial Resource Strain: Low Risk  (02/10/2022)   Received  from [facility]  Tobacco Use: Medium Risk (09/29/2022)     Readmission Risk Interventions     No data to display

## 2022-10-04 NOTE — Plan of Care (Signed)
  Problem: Fluid Volume: Goal: Hemodynamic stability will improve Outcome: Progressing   Problem: Clinical Measurements: Goal: Diagnostic test results will improve Outcome: Progressing   Problem: Education: Goal: Knowledge of General Education information will improve Description: Including pain rating scale, medication(s)/side effects and non-pharmacologic comfort measures Outcome: Progressing   Problem: Clinical Measurements: Goal: Ability to maintain clinical measurements within normal limits will improve Outcome: Progressing

## 2022-10-04 NOTE — Progress Notes (Signed)
Peripherally Inserted Central Catheter Placement  The IV Nurse has discussed with the patient and/or persons authorized to consent for the patient, the purpose of this procedure and the potential benefits and risks involved with this procedure.  The benefits include less needle sticks, lab draws from the catheter, and the patient may be discharged home with the catheter. Risks include, but not limited to, infection, bleeding, blood clot (thrombus formation), and puncture of an artery; nerve damage and irregular heartbeat and possibility to perform a PICC exchange if needed/ordered by physician.  Alternatives to this procedure were also discussed.  Bard Power PICC patient education guide, fact sheet on infection prevention and patient information card has been provided to patient /or left at bedside.    PICC Placement Documentation  PICC Single Lumen 10/04/22 Right Brachial 38 cm 0 cm (Active)  Indication for Insertion or Continuance of Line Prolonged intravenous therapies 10/04/22 1145  Exposed Catheter (cm) 0 cm 10/04/22 1145  Site Assessment Clean, Dry, Intact 10/04/22 1145  Line Status Flushed;Saline locked;Blood return noted 10/04/22 1145  Dressing Type Transparent;Securing device 10/04/22 1145  Dressing Status Antimicrobial disc in place;Clean, Dry, Intact 10/04/22 1145  Line Care Connections checked and tightened 10/04/22 1145  Line Adjustment (NICU/IV Team Only) No 10/04/22 1145  Dressing Intervention New dressing;Adhesive placed at insertion site (IV team only);Other (Comment) 10/04/22 1145  Dressing Change Due 10/11/22 10/04/22 1145       Annett Fabian 10/04/2022, 11:46 AM

## 2022-10-04 NOTE — Care Management Important Message (Signed)
Important Message  Patient Details  Name: Christy Ray MRN: [id number] Date of Birth: 04-09-1955   Medicare Important Message Given:  Yes  Reviewed Medicare IM with patient via room phone [phone]).  Copy of Medicare IM placed in mail to home address on file.    Johnell Comings 10/04/2022, 9:27 AM

## 2022-10-04 NOTE — Progress Notes (Signed)
Patient with home med in pharmacy.  Medication returned to patient prior to discharge.

## 2022-10-04 NOTE — Progress Notes (Signed)
Mobility Specialist - Progress Note   10/04/22 0909  Mobility  Activity Ambulated independently in room;Ambulated independently in hallway  Level of Assistance Independent  Assistive Device None  Distance Ambulated (ft) 135 ft  Activity Response Tolerated well  $Mobility charge 1 Mobility  Mobility Specialist Start Time (ACUTE ONLY) [id number]  Mobility Specialist Stop Time (ACUTE ONLY) 0908  Mobility Specialist Time Calculation (min) (ACUTE ONLY) 4 min   Pt standing at bedside upon entry, utilizing RA. Pt agreeable to amb in the hallway this date. Pt amb one lap around the NS indep, tolerated well. Pt returned to the room, left amb near bed with needs within reach.  Zetta Bills Mobility Specialist 10/04/22 9:11 AM

## 2022-10-05 ENCOUNTER — Encounter: Payer: Self-pay | Admitting: Infectious Diseases

## 2022-10-12 ENCOUNTER — Ambulatory Visit (INDEPENDENT_AMBULATORY_CARE_PROVIDER_SITE_OTHER): Payer: Medicare Other | Admitting: Neurosurgery

## 2022-10-12 ENCOUNTER — Encounter: Payer: Self-pay | Admitting: Neurosurgery

## 2022-10-12 VITALS — BP 140/78 | Ht 64.0 in | Wt 130.0 lb

## 2022-10-12 DIAGNOSIS — Z09 Encounter for follow-up examination after completed treatment for conditions other than malignant neoplasm: Secondary | ICD-10-CM

## 2022-10-12 DIAGNOSIS — G062 Extradural and subdural abscess, unspecified: Secondary | ICD-10-CM

## 2022-10-12 NOTE — Progress Notes (Signed)
   DOS: 09/29/2022  HISTORY OF PRESENT ILLNESS: 10/12/2022 [name] is status post lumbar laminectomy for epidural abscess evacuation.  Overall she is doing well.  She has improvement in her pain.  Her lower extremities continue to have her baseline function.  She is not having significant back pain.  She does have some incisional soreness when she moves in certain directions but otherwise has been doing well.  No incisional issues.  PHYSICAL EXAMINATION:   Vitals:   10/12/22 0936  BP: (!) 140/78   General: Patient is well developed, well nourished, calm, collected, and in no apparent distress.  NEUROLOGICAL:  General: In no acute distress.  Awake, alert, oriented to person, place, and time. Pupils equal round and reactive to light.   Strength:  Side Iliopsoas Quads Hamstring PF DF EHL  R 5 5 5 5 5 5   L 5 5 5 5 5 5    Incision c/d/i   ROS (Neurologic): Negative except as noted above  IMAGING: No interval imaging to review   ASSESSMENT/PLAN:  Christy Ray is doing well after her lumbar laminectomy for epidural abscess washout.  Overall she is doing very well.  She is approximately 2 weeks out from her surgery.  She continues to follow up with her infectious disease team and continues antibiotic administration.  From a surgical standpoint she is doing very well and healing as expected.  Will plan to continue to follow her up as previously scheduled.  Lovenia Kim, MD/MS Department of Neurosurgery

## 2022-10-18 LAB — BMP8: EGFR: 100

## 2022-10-27 ENCOUNTER — Ambulatory Visit: Payer: Medicare Other | Attending: Infectious Diseases | Admitting: Infectious Diseases

## 2022-10-27 ENCOUNTER — Telehealth: Payer: Self-pay | Admitting: Neurosurgery

## 2022-10-27 ENCOUNTER — Encounter: Payer: Self-pay | Admitting: Infectious Diseases

## 2022-10-27 VITALS — BP 146/94 | HR 93 | Ht 64.0 in | Wt 117.0 lb

## 2022-10-27 DIAGNOSIS — A4902 Methicillin resistant Staphylococcus aureus infection, unspecified site: Secondary | ICD-10-CM

## 2022-10-27 DIAGNOSIS — B9562 Methicillin resistant Staphylococcus aureus infection as the cause of diseases classified elsewhere: Secondary | ICD-10-CM | POA: Insufficient documentation

## 2022-10-27 DIAGNOSIS — G062 Extradural and subdural abscess, unspecified: Secondary | ICD-10-CM

## 2022-10-27 DIAGNOSIS — M3392 Dermatopolymyositis, unspecified with myopathy: Secondary | ICD-10-CM | POA: Insufficient documentation

## 2022-10-27 DIAGNOSIS — M6008 Infective myositis, other site: Secondary | ICD-10-CM | POA: Diagnosis not present

## 2022-10-27 MED ORDER — DOXYCYCLINE MONOHYDRATE 100 MG PO TABS: 100.0000 mg | ORAL_TABLET | Freq: Two times a day (BID) | ORAL | 2 refills | Status: DC

## 2022-10-27 NOTE — Patient Instructions (Signed)
You are here for follow up of the spine infection- you are on IV vancomycin . You will complete 6 weeks on 11/09/22 and after that you will go on Doxycycline

## 2022-10-27 NOTE — Telephone Encounter (Signed)
Christy Ray from [facility] has called wanting to confirm if the back issues she is having currently were due to complications with a previous surgery or if this is a new on-going issue? Please advise   Christy Ray [phone] option 2

## 2022-10-27 NOTE — Progress Notes (Signed)
NAME: Christy Ray  DOB: 1956-02-08  MRN: [id number]  Date/Time: 10/27/2022 10:21 AM   Subjective:  Follow-up after recent hospitalization between 09/23/2022 until 10/04/2018 for MRSA spinal infection  Christy Ray is a 67 y.o. 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 Bactrim which was stopped due to rash presented in July with pain and now back She did not have any fever or chills She had recently lost about 10 pounds  She has a history of dermatomyositis/polymyositis but has been off CellCept since May 2023. MRI done on admission showed extensive infection of the spinal area with bilateral paraspinal abscess, left psoas abscess, epidural abscess involving L3-L4 and L4-L5 and facet joint septic arthritis on the left L3-L5.  IR placed a drain in the left paraspinal abscess on the right paraspinal abscess was also aspirated.  That came back positive for  MRSA.  She was initially on vancomycin cefepime and metronidazole and the other 2 antibiotics were stopped and vancomycin was continued.  She underwent laminae and ectomy L3-L4 and washout.  And also had washout of the right paraspinal abscess on 09/29/2022 Patient continued with vancomycin and was discharged home after all the drains were removed.  She was sent home on IV vancomycin to complete 6  weeks of antibiotic which will be on 11/09/2022. She is doing much better No pain in the back No fever or chills Appetite is improving And needs the last Vanco trough level prior to it was 12 CRP less than 10   Past Medical History:  Diagnosis Date   Dermatomyositis (HCC)    MRSA (methicillin resistant Staphylococcus aureus)    Polyarthritis     Past Surgical History:  Procedure  Laterality Date   INCISION AND DRAINAGE OF WOUND Left 07/14/2021   Procedure: IRRIGATION AND DEBRIDEMENT WOUND;  Surgeon: Rosetta Posner, DPM;  Location: [facility] ORS;  Service: Podiatry;  Laterality: Left;   IR RADIOLOGIST EVAL & MGMT  07/29/2021   LUMBAR LAMINECTOMY FOR EPIDURAL ABSCESS N/A 07/16/2021   Procedure: LUMBAR LAMINECTOMY FOR EPIDURAL ABSCESS;  Surgeon: Venetia Night, MD;  Location: [facility] OR[facility];  Service: Neurosurgery;  Laterality: N/A;   LUMBAR LAMINECTOMY FOR EPIDURAL ABSCESS N/A 09/29/2022   Procedure: LUMBAR LAMINECTOMY FOR EPIDURAL ABSCESS;  Surgeon: Loreen Freud, MD;  Location: [facility] ORS;  Service: Neurosurgery;  Laterality: N/A;   OSTECTOMY Left 07/14/2021   Procedure: JOINT RESECTION;  Surgeon: Rosetta Posner, DPM;  Location: [facility] OR[facility];  Service: Podiatry;  Laterality: Left;    Social History   Socioeconomic History   Marital status: Married    Spouse name: Not on file   Number of children: Not on file   Years of education: Not on file   Highest education level: Not on file  Occupational History   Not on file  Tobacco Use   Smoking status: Every Day    Current packs/day: 0.00    Types: Cigarettes    Last attempt to quit: 07/14/2021    Years since quitting: 1.2   Smokeless tobacco: Never  Substance and Sexual Activity   Alcohol use: No   Drug use: Never   Sexual activity: Yes  Other Topics Concern   Not on file  Social History Narrative   Not on file   Social  Determinants of Health   Financial Resource Strain: Low Risk  (02/10/2022)   Received from [facility]   Overall Financial Resource Strain (CARDIA)    Difficulty of Paying Living Expenses: Not hard at all  Food Insecurity: Patient Unable To Answer (09/23/2022)   Hunger Vital Sign    Worried About Running Out of Food in the Last Year: Patient unable to answer    Ran Out of Food in the Last Year: Patient unable to answer  Transportation Needs: Patient Declined (09/23/2022)   PRAPARE -  Administrator, Civil Service (Medical): Patient declined    Lack of Transportation (Non-Medical): Patient declined  Physical Activity: Not on file  Stress: Not on file  Social Connections: Not on file  Intimate Partner Violence: Patient Declined (09/23/2022)   Humiliation, Afraid, Rape, and Kick questionnaire    Fear of Current or Ex-Partner: Patient declined    Emotionally Abused: Patient declined    Physically Abused: Patient declined    Sexually Abused: Patient declined    Family History  Problem Relation Age of Onset   Breast cancer Mother    Hypertension Father    Alzheimer's disease Father    Gout Father    Allergies  Allergen Reactions   Sulfa Antibiotics Rash   I? Current Outpatient Medications  Medication Sig Dispense Refill   acetaminophen (TYLENOL) 325 MG tablet Take 2 tablets (650 mg total) by mouth every 6 (six) hours as needed for mild pain.     acyclovir (ZOVIRAX) 400 MG tablet Take 400 mg by mouth 3 (three) times daily.  TAKE 1 TABLET (400 MG TOTAL) BY MOUTH 3 (THREE) TIMES DAILY FOR 5 DAYS AS NEEDED FOR OUTBREAK     amLODipine (NORVASC) 5 MG tablet Take 1 tablet by mouth daily.     augmented betamethasone dipropionate (DIPROLENE-AF) 0.05 % cream Apply 1 application  topically daily as needed.     esomeprazole (NEXIUM) 40 MG capsule Take 1 capsule (40 mg total) by mouth 2 (two) times daily.     folic acid (FOLVITE) 1 MG tablet Take 1 mg by mouth daily.     losartan (COZAAR) 100 MG tablet Take 1 tablet (100 mg total) by mouth daily. 30 tablet 0   polyethylene glycol (MIRALAX / GLYCOLAX) 17 g packet Take 17 g by mouth daily as needed for moderate constipation. 30 each 0   rosuvastatin (CRESTOR) 10 MG tablet Take 10 mg by mouth daily.     senna-docusate (SENOKOT-S) 8.6-50 MG tablet Take 2 tablets by mouth 2 (two) times daily as needed for mild constipation. 100 tablet 0   sodium chloride flush (NS) 0.9 % SOLN Inject 10 mLs into the vein every 12 (twelve)  hours. 840 mL 0   vancomycin IVPB Inject 1,000 mg into the vein every 12 (twelve) hours. Indication:  MRSA paraspinal, epidural and psoas abscesses First Dose: Yes Last Day of Therapy:  11/09/2022 Labs - Sunday/Monday:  CBC/D, CMP, CRP, ESR, vancomycin trough Labs - Thursday:  BMP and vancomycin trough  Fax weekly lab results  promptly to [phone] Clinic Follow Up Appt:with Dr.Brighton Pilley- 10/27/22 at 9.30 am Call [phone] with any questions Please pull PIC at completion of IV antibiotics  Method of administration:Elastomeric Method of administration may be changed at the discretion of the patient and/or caregiver's ability to self-administer the medication ordered. 72 Units 0   No current facility-administered medications for this visit.     Abtx:  Anti-infectives (From admission, onward)  None       REVIEW OF SYSTEMS:  Const: negative fever, negative chills, negative weight loss Eyes: negative diplopia or visual changes, negative eye pain ENT: negative coryza, negative sore throat Resp: negative cough, hemoptysis, dyspnea Cards: negative for chest pain, palpitations, lower extremity edema GU: negative for frequency, dysuria and hematuria GI: Negative for abdominal pain, diarrhea, bleeding, constipation Skin: negative for rash and pruritus Heme: negative for easy bruising and gum/nose bleeding MS: negative for myalgias, arthralgias, back pain and muscle weakness Neurolo:negative for headaches, dizziness, vertigo, memory problems  Psych: negative for feelings of anxiety, depression  Endocrine: negative for thyroid, diabetes Allergy/Immunology-sulfa Objective:  VITALS:  Ht 5\' 4"  (1.626 m)   Wt 117 lb (53.1 kg)   BMI 20.08 kg/m  LDA Right PICC line looks fine PHYSICAL EXAM:  General: Alert, cooperative, no distress, appears stated age.  Head: Normocephalic, without obvious abnormality, atraumatic. Eyes: Conjunctivae clear, anicteric sclerae. Pupils are  equal ENT Nares normal. No drainage or sinus tenderness. Lips, mucosa, and tongue normal. No Thrush Neck: Supple, symmetrical, no adenopathy, thyroid: non tender no carotid bruit and no JVD. Back: Surgical site looks well.  Sutures have been removed.  Small scab present. Lungs: Clear to auscultation bilaterally. No Wheezing or Rhonchi. No rales. Heart: Regular rate and rhythm, no murmur, rub or gallop. Abdomen: Soft, non-tender,not distended. Bowel sounds normal. No masses Extremities: atraumatic, no cyanosis. No edema. No clubbing Skin: No rashes or lesions. Or bruising Lymph: Cervical, supraclavicular normal. Neurologic: Grossly non-focal Pertinent Labs Labs from 10/24/2022 WBC 7.9, Hb 11.8, platelet 250 creatinine 0.60, CRP 5, LFTs normal Vanco trough pending ESR was not done because of the age of the specimen.  ? Impression/Recommendation 67 year old female with history of dermatomyositis/polymyositis not on any meds currently but used to take CellCept until May 2023 when she had disseminated MRSA infection involving the thoracolumbar spine, and underwent laminotomy.  And was treated with 10 weeks of antibiotics. 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  She also had washout of the right paraspinal abscess on 09/29/2022 Patient is currently on vancomycin every 12 The last Vanco trough I have is around 11.6 The last one from 10/24/2022 still pending Patient  to get IV until 11/09/2022 when she will complete 6 weeks.  I may extend antibiotics by 2 more weeks depending on her sed rate Either way is she will have to start p.o. doxycycline after IV is completed for at least 6 more weeks. Will also repeat MRI in October.   Next Recent labs revealed normal hemoglobin and creatinine and liver function. ___________________________________________________ Discussed with patient, in detail. Note:  This document was prepared using Dragon voice recognition software and may include unintentional dictation errors.

## 2022-10-28 NOTE — Telephone Encounter (Signed)
I notified Tiffany of Dr Lucienne Capers response

## 2022-10-28 NOTE — Telephone Encounter (Signed)
I spoke with Christy Ray at [facility]. Their coders are asking if this is a complication from her previous surgery or if this is something new?

## 2022-11-09 ENCOUNTER — Ambulatory Visit (INDEPENDENT_AMBULATORY_CARE_PROVIDER_SITE_OTHER): Payer: Medicare Other | Admitting: Neurosurgery

## 2022-11-09 ENCOUNTER — Encounter: Payer: Self-pay | Admitting: Neurosurgery

## 2022-11-09 VITALS — BP 142/72 | Temp 98.0°F | Ht 64.0 in | Wt 117.0 lb

## 2022-11-09 DIAGNOSIS — Z09 Encounter for follow-up examination after completed treatment for conditions other than malignant neoplasm: Secondary | ICD-10-CM

## 2022-11-09 DIAGNOSIS — G062 Extradural and subdural abscess, unspecified: Secondary | ICD-10-CM

## 2022-11-09 NOTE — Progress Notes (Signed)
   DOS: 09/29/2022  HISTORY OF PRESENT ILLNESS: 11/09/2022 [name] is status post lumbar laminectomy for epidural abscess evacuation.  She was doing well the last time we saw her, continues to do well.  Had a small portion of her incision which was scabbing.  Notably she has continued to have some weight loss and has been encouraged to improve her nutrition.  She is finishing up her IV antibiotic regimen.  Going to switch to oral soon.  PHYSICAL EXAMINATION:   Vitals:   11/09/22 0948  BP: (!) 142/72  Temp: 98 F (36.7 C)   General: Patient is well developed, well nourished, calm, collected, and in no apparent distress.  NEUROLOGICAL:  General: In no acute distress.  Awake, alert, oriented to person, place, and time. Pupils equal round and reactive to light.   Strength:  Side Iliopsoas Quads Hamstring PF DF EHL  R 5 5 5 5 5 5   L 5 5 5 5 5 5    Incision some scabbing at the most inferior aspect of her incision with secondary healing.  ROS (Neurologic): Negative except as noted above  IMAGING: No interval imaging to review   ASSESSMENT/PLAN:  Christy Ray is doing well after her lumbar laminectomy for epidural abscess washout.  Overall she is doing very well.  She is approximately 6 weeks out from her surgery.  She continues to follow up with her infectious disease team and continues antibiotic administration.  From a surgical standpoint she is doing very well and healing as expected.  She can cover her incision for padding as sometimes he gets tender when she leans on things.  No signs of infection.  Will plan to continue to follow her up as previously scheduled.  Lovenia Kim, MD/MS Department of Neurosurgery

## 2022-11-23 ENCOUNTER — Encounter: Payer: Self-pay | Admitting: Infectious Diseases

## 2022-12-15 ENCOUNTER — Encounter: Payer: Self-pay | Admitting: Infectious Diseases

## 2022-12-15 ENCOUNTER — Other Ambulatory Visit
Admission: RE | Admit: 2022-12-15 | Discharge: 2022-12-15 | Disposition: A | Discharge status: Home / Self Care | Payer: Medicare Other | Source: Ambulatory Visit | Attending: Infectious Diseases | Admitting: Infectious Diseases

## 2022-12-15 ENCOUNTER — Ambulatory Visit: Payer: Medicare Other | Attending: Infectious Diseases | Admitting: Infectious Diseases

## 2022-12-15 ENCOUNTER — Telehealth: Payer: Self-pay

## 2022-12-15 VITALS — BP 131/85 | HR 83 | Temp 97.9°F | Ht 64.0 in | Wt 120.0 lb

## 2022-12-15 DIAGNOSIS — A4902 Methicillin resistant Staphylococcus aureus infection, unspecified site: Secondary | ICD-10-CM

## 2022-12-15 DIAGNOSIS — R634 Abnormal weight loss: Secondary | ICD-10-CM | POA: Diagnosis not present

## 2022-12-15 DIAGNOSIS — K6812 Psoas muscle abscess: Secondary | ICD-10-CM | POA: Diagnosis present

## 2022-12-15 DIAGNOSIS — F17210 Nicotine dependence, cigarettes, uncomplicated: Secondary | ICD-10-CM | POA: Insufficient documentation

## 2022-12-15 DIAGNOSIS — Z792 Long term (current) use of antibiotics: Secondary | ICD-10-CM | POA: Diagnosis not present

## 2022-12-15 DIAGNOSIS — G062 Extradural and subdural abscess, unspecified: Secondary | ICD-10-CM | POA: Insufficient documentation

## 2022-12-15 DIAGNOSIS — B9562 Methicillin resistant Staphylococcus aureus infection as the cause of diseases classified elsewhere: Secondary | ICD-10-CM | POA: Diagnosis not present

## 2022-12-15 DIAGNOSIS — R635 Abnormal weight gain: Secondary | ICD-10-CM | POA: Insufficient documentation

## 2022-12-15 DIAGNOSIS — Z23 Encounter for immunization: Secondary | ICD-10-CM | POA: Diagnosis not present

## 2022-12-15 LAB — COMPREHENSIVE METABOLIC PANEL
ALT: 27 U/L (ref 0–44)
AST: 26 U/L (ref 15–41)
Albumin: 3.7 g/dL (ref 3.5–5.0)
Alkaline Phosphatase: 129 U/L — ABNORMAL HIGH (ref 38–126)
Anion gap: 8 (ref 5–15)
BUN: 16 mg/dL (ref 8–23)
CO2: 27 mmol/L (ref 22–32)
Calcium: 9.4 mg/dL (ref 8.9–10.3)
Chloride: 105 mmol/L (ref 98–111)
Creatinine, Ser: 0.61 mg/dL (ref 0.44–1.00)
GFR, Estimated: >60 mL/min (ref >60)
Glucose, Bld: 95 mg/dL (ref 70–99)
Potassium: 3.7 mmol/L (ref 3.5–5.1)
Sodium: 140 mmol/L (ref 135–145)
Total Bilirubin: 0.5 mg/dL (ref 0.3–1.2)
Total Protein: 7.4 g/dL (ref 6.5–8.1)

## 2022-12-15 LAB — SEDIMENTATION RATE: Sed Rate: 11 mm/h (ref 0–30)

## 2022-12-15 LAB — C-REACTIVE PROTEIN: CRP: 1 mg/dL — ABNORMAL HIGH (ref <1.0)

## 2022-12-15 LAB — CBC WITH DIFFERENTIAL/PLATELET
Abs Immature Granulocytes: 0.02 10*3/uL (ref 0.00–0.07)
Basophils Absolute: 0.1 10*3/uL (ref 0.0–0.1)
Basophils Relative: 1 %
Eosinophils Absolute: 0.1 10*3/uL (ref 0.0–0.5)
Eosinophils Relative: 2 %
HCT: 42 % (ref 36.0–46.0)
Hemoglobin: 13.3 g/dL (ref 12.0–15.0)
Immature Granulocytes: 0 %
Lymphocytes Relative: 23 %
Lymphs Abs: 1.3 10*3/uL (ref 0.7–4.0)
MCH: 26.9 pg (ref 26.0–34.0)
MCHC: 31.7 g/dL (ref 30.0–36.0)
MCV: 85 fL (ref 80.0–100.0)
Monocytes Absolute: 0.4 10*3/uL (ref 0.1–1.0)
Monocytes Relative: 7 %
Neutro Abs: 3.7 10*3/uL (ref 1.7–7.7)
Neutrophils Relative %: 67 %
Platelets: 173 10*3/uL (ref 150–400)
RBC: 4.94 MIL/uL (ref 3.87–5.11)
RDW: 16.9 % — ABNORMAL HIGH (ref 11.5–15.5)
WBC: 5.6 10*3/uL (ref 4.0–10.5)
nRBC: 0 % (ref 0.0–0.2)

## 2022-12-15 NOTE — Telephone Encounter (Signed)
I attempted to contact the patient to relay lab results. I LVM on secured VM with lab results and to continue the doxy.  Christy Ray Christy Ray, CMA

## 2022-12-15 NOTE — Patient Instructions (Signed)
  Dear Sherrill Raring, it was a pleasure to see you for your follow-up visit. I'm glad to hear that you're feeling better and your energy levels have improved since your last visit. However, I understand your concerns about your weight gain and the white lines on your nails. We discussed your ongoing treatment for your spinal infection, your difficulty in gaining weight, and your tobacco use.  YOUR PLAN:  -SPINAL INFECTION: You had a surgery to treat a spinal infection and have been on antibiotics. We need to continue the antibiotics for a total of 12 weeks to ensure the infection is completely cleared.  -UNINTENTIONAL WEIGHT LOSS: Despite eating more, you're having trouble gaining weight. This could be due to your medical history of dermatomyositis or possibly a thyroid issue. Please ask your PCP to look into this further when you see her next week  -TOBACCO USE: You're smoking about 10 cigarettes a day. Quitting smoking can be hard, but there are aids that can help. Please  discuss this with your primary care provider.  INSTRUCTIONS:  Please continue taking your Doxycycline for a total of 12 weeks. We'll check your inflammatory markers today to see how your body is responding to the treatment. We'll also consult with Dr. Katrinka Blazing about when to schedule a repeat MRI. Your primary care provider will check your thyroid function to see if it's contributing to your difficulty in gaining weight. We'll also consider other possible causes, including an evaluation for underlying malignancy. Please consider starting weightlifting or resistance training to help build muscle mass. Lastly, we encourage you to quit smoking. We'll discuss alternative smoking cessation aids with your primary care provider. We'll see you again in 12 weeks at the completion of your Doxycycline course.

## 2022-12-15 NOTE — Progress Notes (Signed)
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 Bactrim which was stopped due to rash dermatomyositis, polymyositis, and a recent spinal infection, presents for follow-up. She has been on oral doxycycline for a little over five weeks following a six-week course of vancomycin via a PICC line which she finished on 11/08/12 for a spinal infection. The infection was managed surgically with a laminectomy and washout. She reports feeling better and has noticed an improvement in her physical capabilities and energy levels since her last visit.  However, she has been struggling with weight gain despite an improved appetite and increased caloric intake, including nutritional drinks. She has only gained a pound since her last visit and is concerned about her inability to regain her weight. She also reports white lines on her nails, which she was informed are likely due to the stress her body underwent during her recent illness.     Past Medical History:  Diagnosis Date   Dermatomyositis (HCC)    MRSA (methicillin resistant Staphylococcus aureus)    Polyarthritis     Past Surgical History:  Procedure Laterality Date   INCISION AND DRAINAGE OF WOUND Left 07/14/2021   Procedure: IRRIGATION AND DEBRIDEMENT WOUND;  Surgeon: Rosetta Posner, DPM;  Location: [facility];  Service: Podiatry;  Laterality: Left;   IR RADIOLOGIST EVAL & MGMT  07/29/2021   LUMBAR LAMINECTOMY FOR EPIDURAL ABSCESS N/A 07/16/2021   Procedure: LUMBAR LAMINECTOMY FOR EPIDURAL ABSCESS;  Surgeon: Venetia Night, MD;  Location: [facility];  Service: Neurosurgery;  Laterality:  N/A;   LUMBAR LAMINECTOMY FOR EPIDURAL ABSCESS N/A 09/29/2022   Procedure: LUMBAR LAMINECTOMY FOR EPIDURAL ABSCESS;  Surgeon: Loreen Freud, MD;  Location: [facility];  Service: Neurosurgery;  Laterality: N/A;   OSTECTOMY Left 07/14/2021   Procedure: JOINT RESECTION;  Surgeon: Rosetta Posner, DPM;  Location: [facility];  Service: Podiatry;  Laterality: Left;    Social History   Socioeconomic History   Marital status: Married    Spouse name: Not on file   Number of children: Not on file   Years of education: Not on file   Highest education level: Not on file  Occupational History   Not on file  Tobacco Use   Smoking status: Every Day    Current packs/day: 0.00    Types: Cigarettes    Last attempt to quit: 07/14/2021    Years since quitting: 1.4   Smokeless tobacco: Never  Substance and Sexual Activity   Alcohol use: No   Drug use: Never   Sexual activity: Yes  Other Topics Concern   Not on file  Social History Narrative   Not on file   Social Determinants of Health   Financial Resource Strain: Low Risk  (02/10/2022)   Received from [facility]   Overall Financial Resource Strain (CARDIA)    Difficulty of Paying Living Expenses: Not hard at all  Food Insecurity: Patient Unable To Answer (09/23/2022)   Hunger Vital Sign    Worried About Running Out of Food in the Last Year: Patient unable to answer    Ran Out of Food in the Last Year: Patient unable  to answer  Transportation Needs: Patient Declined (09/23/2022)   PRAPARE - Administrator, Civil Service (Medical): Patient declined    Lack of Transportation (Non-Medical): Patient declined  Physical Activity: Not on file  Stress: Not on file  Social Connections: Not on file  Intimate Partner Violence: Patient Declined (09/23/2022)   Humiliation, Afraid, Rape, and Kick questionnaire    Fear of Current or Ex-Partner: Patient declined    Emotionally Abused: Patient declined    Physically Abused:  Patient declined    Sexually Abused: Patient declined    Family History  Problem Relation Age of Onset   Breast cancer Mother    Hypertension Father    Alzheimer's disease Father    Gout Father    Allergies  Allergen Reactions   Sulfa Antibiotics Rash   I? Current Outpatient Medications  Medication Sig Dispense Refill   acetaminophen (TYLENOL) 325 MG tablet Take 2 tablets (650 mg total) by mouth every 6 (six) hours as needed for mild pain.     acyclovir (ZOVIRAX) 400 MG tablet Take 400 mg by mouth 3 (three) times daily.  TAKE 1 TABLET (400 MG TOTAL) BY MOUTH 3 (THREE) TIMES DAILY FOR 5 DAYS AS NEEDED FOR OUTBREAK     amLODipine (NORVASC) 5 MG tablet Take 1 tablet by mouth daily.     augmented betamethasone dipropionate (DIPROLENE-AF) 0.05 % cream Apply 1 application  topically daily as needed.     doxycycline (ADOXA) 100 MG tablet Take 1 tablet (100 mg total) by mouth 2 (two) times daily. 60 tablet 2   esomeprazole (NEXIUM) 40 MG capsule Take 1 capsule (40 mg total) by mouth 2 (two) times daily.     folic acid (FOLVITE) 1 MG tablet Take 1 mg by mouth daily.     losartan (COZAAR) 100 MG tablet Take 1 tablet (100 mg total) by mouth daily. 30 tablet 0   polyethylene glycol (MIRALAX / GLYCOLAX) 17 g packet Take 17 g by mouth daily as needed for moderate constipation. 30 each 0   rosuvastatin (CRESTOR) 10 MG tablet Take 10 mg by mouth daily.     senna-docusate (SENOKOT-S) 8.6-50 MG tablet Take 2 tablets by mouth 2 (two) times daily as needed for mild constipation. 100 tablet 0   No current facility-administered medications for this visit.     Abtx:  Anti-infectives (From admission, onward)    None       REVIEW OF SYSTEMS:  Const: negative fever, negative chills, negative weight loss Eyes: negative diplopia or visual changes, negative eye pain ENT: negative coryza, negative sore throat Resp: negative cough, hemoptysis, dyspnea Cards: negative for chest pain, palpitations, lower  extremity edema GU: negative for frequency, dysuria and hematuria GI: Negative for abdominal pain, diarrhea, bleeding, constipation Skin: negative for rash and pruritus Heme: negative for easy bruising and gum/nose bleeding MS: negative for myalgias, arthralgias, back pain and muscle weakness Neurolo:negative for headaches, dizziness, vertigo, memory problems  Psych: negative for feelings of anxiety, depression  Endocrine: negative for thyroid, diabetes Allergy/Immunology-sulfa Objective:  VITALS:  BP 131/85   Pulse 83   Temp 97.9 F (36.6 C) (Temporal)   Ht 5\' 4"  (1.626 m)   Wt 120 lb (54.4 kg)   SpO2 98%   BMI 20.60 kg/m   PHYSICAL EXAM:  General: Alert, cooperative, no distress, appears stated age.  Head: Normocephalic, without obvious abnormality, atraumatic. Eyes: Conjunctivae clear, anicteric sclerae. Pupils are equal ENT Nares normal. No drainage or sinus tenderness. Lips, mucosa,  and tongue normal. No Thrush Neck: Supple, symmetrical, no adenopathy, thyroid: non tender no carotid bruit and no JVD. Back: Surgical site looks well.  Sutures have been removed.  Small scab present. Lungs: Clear to auscultation bilaterally. No Wheezing or Rhonchi. No rales. Heart: Regular rate and rhythm, no murmur, rub or gallop. Abdomen: Soft, non-tender,not distended. Bowel sounds normal. No masses Extremities: atraumatic, no cyanosis. No edema. No clubbing Skin: No rashes or lesions. Or bruising Lymph: Cervical, supraclavicular normal. Neurologic: Grossly non-focal Pertinent Labs none  ? Impression/Recommendation MRSA infection extensive infection of the lumbar spine with bilateral paraspinal abscess, left psoas abscess which was drained, epidural abscess involving L3-L4 and L4-L5 and facet joint septic arthritis on the left L3 L5 She underwent laminectomy of L3-L4 and washout.  She also had washout of the right paraspinal abscess on 09/29/2022  Completed 6 weeks of IV Vancomycin  until 11/09/2022. Currently on oral Doxycycline for the past 5 weeks. Discussed the need for a total of 12 weeks of antibiotics to ensure complete clearance of infection. -Continue Doxycycline for a total of 12 weeks. -Check inflammatory markers today to assess response to treatment. -Consult with Dr. Katrinka Blazing regarding timing for repeat MRI.  Unintentional Weight Loss Reports difficulty gaining weight despite adequate caloric intake. History of dermatomyositis and polymyositis. Family history of thyroid disease. -Recommend primary care provider check thyroid function. -Consider weightlifting/resistance training to build muscle mass. -Consider evaluation for underlying malignancy given history of dermatomyositis and polymyositis.  Tobacco Use Reports smoking approximately 10 cigarettes per day. Attempted to quit using Chantix but experienced adverse effects. -Encourage smoking cessation. -Consider alternative smoking cessation aids such as Bupropion with primary care provider.  Follow-up in 12 weeks at completion of Doxycycline course. ___________________________________________________ Discussed with patient, in detail.

## 2022-12-15 NOTE — Telephone Encounter (Signed)
-----   Message from Lynn Ito sent at 12/15/2022  3:30 PM EDT ----- Please let her know that her labs ( ESR and CRP) are normal now. Compared to when she had the active infection- she will continue with PO Doxy as planned ----- Message ----- From: Interface, Lab In [place] Sent: 12/15/2022  10:50 AM EDT To: Lynn Ito, MD

## 2022-12-20 ENCOUNTER — Telehealth: Payer: Self-pay

## 2022-12-20 ENCOUNTER — Encounter: Payer: Self-pay | Admitting: Neurosurgery

## 2022-12-20 ENCOUNTER — Ambulatory Visit (INDEPENDENT_AMBULATORY_CARE_PROVIDER_SITE_OTHER): Payer: Medicare Other | Admitting: Neurosurgery

## 2022-12-20 VITALS — BP 144/96 | Temp 97.9°F | Ht 64.0 in | Wt 120.0 lb

## 2022-12-20 DIAGNOSIS — G062 Extradural and subdural abscess, unspecified: Secondary | ICD-10-CM

## 2022-12-20 DIAGNOSIS — Z09 Encounter for follow-up examination after completed treatment for conditions other than malignant neoplasm: Secondary | ICD-10-CM

## 2022-12-20 NOTE — Progress Notes (Signed)
   DOS: 09/29/2022  HISTORY OF PRESENT ILLNESS:\ 12/20/22 Christy Ray is a 67 y.o status post lumbar laminectomy for epidural abscess evacuation on 09/29/22.  Today she is doing very well.  She states that she does have some soreness in her back with bending activities but overall things are very tolerable.  She is currently on oral doxycycline per infectious disease.  She is still struggling with weight gain.  11/09/22 Dr. Katrinka Blazing Christy Ray is status post lumbar laminectomy for epidural abscess evacuation.  She was doing well the last time we saw her, continues to do well.  Had a small portion of her incision which was scabbing.  Notably she has continued to have some weight loss and has been encouraged to improve her nutrition.  She is finishing up her IV antibiotic regimen.  Going to switch to oral soon.  PHYSICAL EXAMINATION:   Vitals:   12/20/22 1028  BP: (!) 144/96  Temp: 97.9 F (36.6 C)   A&Ox3  5/5 throughout BLE  Incision well healed   ROS (Neurologic): Negative except as noted above  IMAGING: No interval imaging to review   ASSESSMENT/PLAN:  Christy Ray is doing well after her lumbar laminectomy for epidural abscess washout.  She is doing very well despite some continued soreness in her back with activity.  Overall she feels things are very manageable.  She is following up with infectious disease for ongoing antibiotic treatment.  She mentioned that they are planning to get a MRI scan of her lumbar spine in about 3 months and will communicate this with Dr. Katrinka Blazing.  She is to follow-up with Dr. Katrinka Blazing in 6 months or sooner should she have any questions or concerns.  She expressed understanding and was in agreement with this plan.  Manning Charity PA-C Department of Neurosurgery  I spent a total of 20 minutes in both face-to-face and non-face-to-face activities for this visit on the date of this encounter including review of records, review of symptoms, discussion of  progress, and physical exam.

## 2022-12-20 NOTE — Telephone Encounter (Signed)
Spoke with patient and she confirmed she received the VM and is continuing the doxy

## 2022-12-20 NOTE — Telephone Encounter (Signed)
-----   Message from Lynn Ito sent at 12/15/2022  3:30 PM EDT ----- Please let her know that her labs ( ESR and CRP) are normal now. Compared to when she had the active infection- she will continue with PO Doxy as planned ----- Message ----- From: Interface, Lab In [place] Sent: 12/15/2022  10:50 AM EDT To: Lynn Ito, MD

## 2023-01-31 ENCOUNTER — Encounter: Payer: Self-pay | Admitting: Infectious Diseases

## 2023-01-31 ENCOUNTER — Ambulatory Visit: Payer: Medicare Other | Attending: Infectious Diseases | Admitting: Infectious Diseases

## 2023-01-31 VITALS — BP 158/92 | HR 81 | Temp 97.2°F | Ht 64.0 in | Wt 121.0 lb

## 2023-01-31 DIAGNOSIS — B009 Herpesviral infection, unspecified: Secondary | ICD-10-CM | POA: Diagnosis not present

## 2023-01-31 DIAGNOSIS — M3399 Dermatopolymyositis, unspecified with other organ involvement: Secondary | ICD-10-CM | POA: Insufficient documentation

## 2023-01-31 DIAGNOSIS — M6008 Infective myositis, other site: Secondary | ICD-10-CM

## 2023-01-31 DIAGNOSIS — F17210 Nicotine dependence, cigarettes, uncomplicated: Secondary | ICD-10-CM | POA: Diagnosis not present

## 2023-01-31 DIAGNOSIS — A4902 Methicillin resistant Staphylococcus aureus infection, unspecified site: Secondary | ICD-10-CM | POA: Diagnosis present

## 2023-01-31 DIAGNOSIS — B9562 Methicillin resistant Staphylococcus aureus infection as the cause of diseases classified elsewhere: Secondary | ICD-10-CM | POA: Insufficient documentation

## 2023-01-31 DIAGNOSIS — K6812 Psoas muscle abscess: Secondary | ICD-10-CM | POA: Insufficient documentation

## 2023-01-31 DIAGNOSIS — G061 Intraspinal abscess and granuloma: Secondary | ICD-10-CM | POA: Diagnosis not present

## 2023-01-31 DIAGNOSIS — M4656 Other infective spondylopathies, lumbar region: Secondary | ICD-10-CM | POA: Insufficient documentation

## 2023-01-31 DIAGNOSIS — M4646 Discitis, unspecified, lumbar region: Secondary | ICD-10-CM

## 2023-01-31 MED ORDER — DOXYCYCLINE MONOHYDRATE 100 MG PO TABS: 100.0000 mg | ORAL_TABLET | Freq: Two times a day (BID) | ORAL | 3 refills | Status: AC

## 2023-01-31 NOTE — Progress Notes (Signed)
NAME: Christy Ray  DOB: 02-24-1956  MRN: [id number]  Date/Time: 01/31/2023 8:42 AM   Subjective:  The patient, with a history of MRSA infection in the spine, presents for follow-up. She reports feeling 'wonderful' with no back pain, except for occasional discomfort when overexerting herself. She has been able to engage in physical activities such as yard work and playing with her grandchildren without significant discomfort. She is currently on a three-month course of doxycycline for the MRSA infection, which is about to end. This is her second occurrence of MRSA infection in the same location.  The patient also has dermatomyositis and polymyositis, for which she is taking hydroxychloroquine. She reports no recent outbreaks requiring acyclovir. She continues to smoke despite acknowledging the need to quit. She has not lost weight and feels more energetic than she has in years.   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 Bactrim which was stopped due to rash, h/o  dermatomyositis, polymyositis, with recurrence of MRSA spinal infection, presents for follow-up. She has been on oral doxycycline for 12 weeks  following a six-week course of vancomycin via a PICC line which she finished on 11/08/12 for a spinal infection. The infection was managed surgically with a laminectomy and washout. Now on Doxy since 9/11/124    Past Medical History:  Diagnosis Date   Dermatomyositis (HCC)    MRSA (methicillin resistant Staphylococcus aureus)    Polyarthritis     Past Surgical History:  Procedure Laterality Date   INCISION AND DRAINAGE OF WOUND Left 07/14/2021   Procedure: IRRIGATION AND DEBRIDEMENT WOUND;  Surgeon: Rosetta Posner, DPM;  Location: [facility];  Service:  Podiatry;  Laterality: Left;   IR RADIOLOGIST EVAL & MGMT  07/29/2021   LUMBAR LAMINECTOMY FOR EPIDURAL ABSCESS N/A 07/16/2021   Procedure: LUMBAR LAMINECTOMY FOR EPIDURAL ABSCESS;  Surgeon: Venetia Night, MD;  Location: [facility];  Service: Neurosurgery;  Laterality: N/A;   LUMBAR LAMINECTOMY FOR EPIDURAL ABSCESS N/A 09/29/2022   Procedure: LUMBAR LAMINECTOMY FOR EPIDURAL ABSCESS;  Surgeon: Loreen Freud, MD;  Location: [facility];  Service: Neurosurgery;  Laterality: N/A;   OSTECTOMY Left 07/14/2021   Procedure: JOINT RESECTION;  Surgeon: Rosetta Posner, DPM;  Location: [facility];  Service: Podiatry;  Laterality: Left;    Social History   Socioeconomic History   Marital status: Married    Spouse name: Not on file   Number of children: Not on file   Years of education: Not on file   Highest education level: Not on file  Occupational History   Not on file  Tobacco Use   Smoking status: Every Day    Current packs/day: 0.00    Types: Cigarettes    Last attempt to quit: 07/14/2021    Years since quitting: 1.5   Smokeless tobacco: Never  Substance and Sexual Activity   Alcohol use: No   Drug use: Never   Sexual activity: Yes  Other Topics Concern   Not on file  Social History Narrative   Not on file   Social Determinants of Health   Financial Resource Strain: Low Risk  (02/10/2022)   Received from [facility]   Overall Financial Resource Strain (CARDIA)    Difficulty of Paying Living Expenses: Not hard at all  Food Insecurity:  Patient Unable To Answer (09/23/2022)   Hunger Vital Sign    Worried About Running Out of Food in the Last Year: Patient unable to answer    Ran Out of Food in the Last Year: Patient unable to answer  Transportation Needs: Patient Declined (09/23/2022)   PRAPARE - Administrator, Civil Service (Medical): Patient declined    Lack of Transportation (Non-Medical): Patient declined  Physical Activity: Not on file  Stress: Not  on file  Social Connections: Not on file  Intimate Partner Violence: Patient Declined (09/23/2022)   Humiliation, Afraid, Rape, and Kick questionnaire    Fear of Current or Ex-Partner: Patient declined    Emotionally Abused: Patient declined    Physically Abused: Patient declined    Sexually Abused: Patient declined    Family History  Problem Relation Age of Onset   Breast cancer Mother    Hypertension Father    Alzheimer's disease Father    Gout Father    Allergies  Allergen Reactions   Sulfa Antibiotics Rash   I? Current Outpatient Medications  Medication Sig Dispense Refill   acetaminophen (TYLENOL) 325 MG tablet Take 2 tablets (650 mg total) by mouth every 6 (six) hours as needed for mild pain.     acyclovir (ZOVIRAX) 400 MG tablet Take 400 mg by mouth 3 (three) times daily.  TAKE 1 TABLET (400 MG TOTAL) BY MOUTH 3 (THREE) TIMES DAILY FOR 5 DAYS AS NEEDED FOR OUTBREAK     amLODipine (NORVASC) 5 MG tablet Take 1 tablet by mouth daily.     augmented betamethasone dipropionate (DIPROLENE-AF) 0.05 % cream Apply 1 application  topically daily as needed.     doxycycline (ADOXA) 100 MG tablet Take 1 tablet (100 mg total) by mouth 2 (two) times daily. 60 tablet 2   esomeprazole (NEXIUM) 40 MG capsule Take 1 capsule (40 mg total) by mouth 2 (two) times daily.     folic acid (FOLVITE) 1 MG tablet Take 1 mg by mouth daily.     hydroxychloroquine (PLAQUENIL) 200 MG tablet Take by mouth.     losartan (COZAAR) 100 MG tablet Take 1 tablet (100 mg total) by mouth daily. 30 tablet 0   rosuvastatin (CRESTOR) 10 MG tablet Take 10 mg by mouth daily.     No current facility-administered medications for this visit.     Abtx:  Anti-infectives (From admission, onward)    None       REVIEW OF SYSTEMS:  Const: negative fever, negative chills, trying to gain weight  Eyes: negative diplopia or visual changes, negative eye pain ENT: negative coryza, negative sore throat Resp: negative cough,  hemoptysis, dyspnea Cards: negative for chest pain, palpitations, lower extremity edema GU: negative for frequency, dysuria and hematuria GI: Negative for abdominal pain, diarrhea, bleeding, constipation Skin: negative for rash and pruritus Heme: negative for easy bruising and gum/nose bleeding MS: negative for myalgias, arthralgias, back pain and muscle weakness Neurolo:negative for headaches, dizziness, vertigo, memory problems  Psych: negative for feelings of anxiety, depression  Endocrine: negative for thyroid, diabetes Allergy/Immunology-sulfa Objective:  VITALS:  BP (!) 158/92   Pulse 81   Temp (!) 97.2 F (36.2 C) (Temporal)   Ht 5\' 4"  (1.626 m)   Wt 121 lb (54.9 kg)   BMI 20.77 kg/m   PHYSICAL EXAM:  General: Alert, cooperative, no distress, appears stated age.  Head: Normocephalic, without obvious abnormality, atraumatic. Eyes: Conjunctivae clear, anicteric sclerae. Pupils are equal ENT Nares normal. No drainage or  sinus tenderness. Lips, mucosa, and tongue normal. No Thrush Neck: Supple, symmetrical, no adenopathy, thyroid: non tender no carotid bruit and no JVD. Back: Surgicalscarooks well.   Lungs: Clear to auscultation bilaterally. No Wheezing or Rhonchi. No rales. Heart: Regular rate and rhythm, no murmur, rub or gallop. Abdomen: Soft, non-tender,not distended. Bowel sounds normal. No masses Extremities: atraumatic, no cyanosis. No edema. No clubbing Skin: No rashes or lesions. Or bruising Lymph: Cervical, supraclavicular normal. Neurologic: Grossly non-focal Pertinent Labs 12/15/22 Sed rate 11 Crp 1  ? Impression/Recommendation MRSA infection extensive infection of the lumbar spine with bilateral paraspinal abscess, left psoas abscess which was drained, epidural abscess involving L3-L4 and L4-L5 and facet joint septic arthritis on the left L3 L5 She underwent laminectomy of L3-L4 and washout.  She also had washout of the right paraspinal abscess on  09/29/2022  Completed 6 weeks of IV Vancomycin until 11/09/2022. Currently on oral Doxycycline for the past 12 weeks.-Continue Doxycycline till we get MRI -inflammatory markers much better Will get MRI after discussing with neurosurgeon    Dermatomyositis/Polymyositis Patient is asymptomatic and currently on Hydroxychloroquine. -Continue Hydroxychloroquine.  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 Patient is on Acyclovir as needed for outbreaks. -Continue Acyclovir as needed for outbreaks.  Tobacco Use Patient continues to smoke. -Encourage smoking cessation.  Follow-up in 3 months  ___________________________________________________ Discussed with patient, in detail.

## 2023-01-31 NOTE — Patient Instructions (Signed)
  You had a follow-up appointment today to check on your MRSA spine infection, dermatomyositis, and polymyositis. You reported feeling wonderful with no significant back pain and more energy than you've had in years. We discussed your current medications and the need for further imaging to assess the status of your MRSA infection.  YOUR PLAN:  -MRSA SPINE INFECTION: MRSA is a type of bacterial infection that is resistant to many antibiotics. You have completed a three-month course of Doxycycline and are currently asymptomatic. We will continue the Doxycycline until an MRI of your spine is performed to assess the status of the infection. We will also communicate with your neurosurgeon, Dr. Katrinka Blazing, regarding the need for further imaging.  -DERMATOMYOSITIS/POLYMYOSITIS: Dermatomyositis and polymyositis are inflammatory diseases that cause muscle weakness and skin rashes. You are currently asymptomatic and taking Hydroxychloroquine. Continue with your current medication.  -HERPES SIMPLEX VIRUS: Herpes Simplex Virus causes outbreaks of sores and blisters. Your last outbreak was about a month ago, and you are taking Acyclovir as needed for outbreaks. Continue taking Acyclovir as needed.  -TOBACCO USE: Smoking can have many harmful effects on your health. We encourage you to quit smoking to improve your overall health.  INSTRUCTIONS:  Please follow up in 3 months or sooner based on the MRI results. Continue taking your medications as prescribed . We will communicate with Dr. Katrinka Blazing and schedule MRI

## 2023-02-01 ENCOUNTER — Other Ambulatory Visit: Payer: Self-pay | Admitting: Infectious Diseases

## 2023-02-01 DIAGNOSIS — G062 Extradural and subdural abscess, unspecified: Secondary | ICD-10-CM

## 2023-02-01 DIAGNOSIS — M6008 Infective myositis, other site: Secondary | ICD-10-CM

## 2023-02-01 DIAGNOSIS — M4620 Osteomyelitis of vertebra, site unspecified: Secondary | ICD-10-CM

## 2023-02-01 DIAGNOSIS — A4902 Methicillin resistant Staphylococcus aureus infection, unspecified site: Secondary | ICD-10-CM

## 2023-02-01 DIAGNOSIS — M4645 Discitis, unspecified, thoracolumbar region: Secondary | ICD-10-CM

## 2023-02-01 NOTE — Progress Notes (Signed)
Follow up MRI thoracic and lumbar spine ordered after discussing with neurosurgeon, for recurrent MRSA spine infection

## 2023-02-08 ENCOUNTER — Ambulatory Visit
Admission: RE | Admit: 2023-02-08 | Discharge: 2023-02-08 | Disposition: A | Discharge status: Home / Self Care | Payer: Medicare Other | Source: Ambulatory Visit | Attending: Infectious Diseases | Admitting: Infectious Diseases

## 2023-02-08 ENCOUNTER — Ambulatory Visit: Payer: Medicare Other

## 2023-02-08 DIAGNOSIS — M6008 Infective myositis, other site: Secondary | ICD-10-CM | POA: Insufficient documentation

## 2023-02-08 DIAGNOSIS — G062 Extradural and subdural abscess, unspecified: Secondary | ICD-10-CM | POA: Insufficient documentation

## 2023-02-08 DIAGNOSIS — M4645 Discitis, unspecified, thoracolumbar region: Secondary | ICD-10-CM | POA: Insufficient documentation

## 2023-02-08 DIAGNOSIS — M4620 Osteomyelitis of vertebra, site unspecified: Secondary | ICD-10-CM | POA: Insufficient documentation

## 2023-02-08 DIAGNOSIS — A4902 Methicillin resistant Staphylococcus aureus infection, unspecified site: Secondary | ICD-10-CM | POA: Diagnosis present

## 2023-02-08 MED ORDER — GADOBUTROL 1 MMOL/ML IV SOLN
10.0000 mL | Freq: Once | INTRAVENOUS | Status: AC | PRN
  Administered 2023-02-08: 10 mL via INTRAVENOUS

## 2023-02-23 ENCOUNTER — Telehealth: Payer: Self-pay

## 2023-02-23 NOTE — Telephone Encounter (Signed)
Patient informed of results by Dr. Rivka Safer.  Patient verbalized understanding and scheduled for an appointment already  05/09/23

## 2023-02-23 NOTE — Telephone Encounter (Signed)
-----   Message from [facility] sent at 02/23/2023  2:45 PM EST ----- Please let her know that her MRI looked great- no residual infection- She could stop antibiotic Doxy - and follow her in 6 weeks or sooner if needed. thx ----- Message ----- From: Interface, Rad Results In Sent: 02/20/2023   5:52 PM EST To: Lynn Ito, MD

## 2023-05-09 ENCOUNTER — Ambulatory Visit: Payer: Medicare Other | Admitting: Infectious Diseases

## 2023-05-11 ENCOUNTER — Encounter: Payer: Self-pay | Admitting: Infectious Diseases

## 2023-05-11 ENCOUNTER — Ambulatory Visit: Payer: Medicare Other | Attending: Infectious Diseases | Admitting: Infectious Diseases

## 2023-05-11 ENCOUNTER — Other Ambulatory Visit
Admission: RE | Admit: 2023-05-11 | Discharge: 2023-05-11 | Disposition: A | Discharge status: Home / Self Care | Source: Ambulatory Visit | Attending: Infectious Diseases | Admitting: Infectious Diseases

## 2023-05-11 VITALS — BP 144/89 | HR 79 | Temp 98.2°F | Wt 126.0 lb

## 2023-05-11 DIAGNOSIS — M5134 Other intervertebral disc degeneration, thoracic region: Secondary | ICD-10-CM | POA: Insufficient documentation

## 2023-05-11 DIAGNOSIS — B957 Other staphylococcus as the cause of diseases classified elsewhere: Secondary | ICD-10-CM | POA: Insufficient documentation

## 2023-05-11 DIAGNOSIS — Z8614 Personal history of Methicillin resistant Staphylococcus aureus infection: Secondary | ICD-10-CM

## 2023-05-11 DIAGNOSIS — F17210 Nicotine dependence, cigarettes, uncomplicated: Secondary | ICD-10-CM | POA: Insufficient documentation

## 2023-05-11 DIAGNOSIS — M0008 Staphylococcal arthritis, vertebrae: Secondary | ICD-10-CM | POA: Diagnosis not present

## 2023-05-11 DIAGNOSIS — A4902 Methicillin resistant Staphylococcus aureus infection, unspecified site: Secondary | ICD-10-CM

## 2023-05-11 DIAGNOSIS — B9562 Methicillin resistant Staphylococcus aureus infection as the cause of diseases classified elsewhere: Secondary | ICD-10-CM | POA: Diagnosis not present

## 2023-05-11 DIAGNOSIS — G062 Extradural and subdural abscess, unspecified: Secondary | ICD-10-CM | POA: Insufficient documentation

## 2023-05-11 DIAGNOSIS — B009 Herpesviral infection, unspecified: Secondary | ICD-10-CM | POA: Diagnosis not present

## 2023-05-11 DIAGNOSIS — K6812 Psoas muscle abscess: Secondary | ICD-10-CM | POA: Diagnosis not present

## 2023-05-11 DIAGNOSIS — M47896 Other spondylosis, lumbar region: Secondary | ICD-10-CM | POA: Diagnosis not present

## 2023-05-11 DIAGNOSIS — Z09 Encounter for follow-up examination after completed treatment for conditions other than malignant neoplasm: Secondary | ICD-10-CM | POA: Diagnosis not present

## 2023-05-11 DIAGNOSIS — Z79899 Other long term (current) drug therapy: Secondary | ICD-10-CM | POA: Diagnosis not present

## 2023-05-11 LAB — COMPREHENSIVE METABOLIC PANEL
ALT: 27 U/L (ref 0–44)
AST: 27 U/L (ref 15–41)
Albumin: 3.8 g/dL (ref 3.5–5.0)
Alkaline Phosphatase: 105 U/L (ref 38–126)
Anion gap: 8 (ref 5–15)
BUN: 18 mg/dL (ref 8–23)
CO2: 27 mmol/L (ref 22–32)
Calcium: 10 mg/dL (ref 8.9–10.3)
Chloride: 105 mmol/L (ref 98–111)
Creatinine, Ser: 0.77 mg/dL (ref 0.44–1.00)
GFR, Estimated: >60 mL/min (ref >60)
Glucose, Bld: 87 mg/dL (ref 70–99)
Potassium: 3.9 mmol/L (ref 3.5–5.1)
Sodium: 140 mmol/L (ref 135–145)
Total Bilirubin: 0.7 mg/dL (ref 0.0–1.2)
Total Protein: 7.3 g/dL (ref 6.5–8.1)

## 2023-05-11 LAB — C-REACTIVE PROTEIN: CRP: 0.6 mg/dL (ref <1.0)

## 2023-05-11 LAB — CBC WITH DIFFERENTIAL/PLATELET
Abs Immature Granulocytes: 0.04 10*3/uL (ref 0.00–0.07)
Basophils Absolute: 0.1 10*3/uL (ref 0.0–0.1)
Basophils Relative: 1 %
Eosinophils Absolute: 0.2 10*3/uL (ref 0.0–0.5)
Eosinophils Relative: 2 %
HCT: 44.8 % (ref 36.0–46.0)
Hemoglobin: 15.1 g/dL — ABNORMAL HIGH (ref 12.0–15.0)
Immature Granulocytes: 1 %
Lymphocytes Relative: 19 %
Lymphs Abs: 1.7 10*3/uL (ref 0.7–4.0)
MCH: 30.1 pg (ref 26.0–34.0)
MCHC: 33.7 g/dL (ref 30.0–36.0)
MCV: 89.2 fL (ref 80.0–100.0)
Monocytes Absolute: 0.8 10*3/uL (ref 0.1–1.0)
Monocytes Relative: 9 %
Neutro Abs: 6 10*3/uL (ref 1.7–7.7)
Neutrophils Relative %: 68 %
Platelets: 177 10*3/uL (ref 150–400)
RBC: 5.02 MIL/uL (ref 3.87–5.11)
RDW: 13.8 % (ref 11.5–15.5)
WBC: 8.6 10*3/uL (ref 4.0–10.5)
nRBC: 0 % (ref 0.0–0.2)

## 2023-05-11 LAB — SEDIMENTATION RATE: Sed Rate: 9 mm/h (ref 0–30)

## 2023-05-11 NOTE — Progress Notes (Signed)
 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 Bactrim which was stopped due to rash, h/o  dermatomyositis, polymyositis, with recurrence of MRSA spinal infection in 2024 presents for follow-up. She has been on oral doxycycline for 12 weeks  following a six-week course of vancomycin via a PICC line which she finished on 11/08/12 for a spinal infection. The infection was managed surgically with a laminectomy and washout. Was on  Doxy since 11/09/2022 and then had a repeat MRI Thoracic on 02/08/23 No evidence of thoracic spinal infection . No evidence of residual or recurrent discitis or osteomyelitis or of epidural abscess. 2. 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 No compressive stenosis. MRI of lumbar soine   Complete resolution of large regional soft tissue abscesses. No residual fluid components. 2. Previous laminectomy on the left at L4-5 for decompression of epidural abscess. No active bone disease is identified presently. No residual epidural component. 3. Chronic facet arthritis at L3-4 and L4-5 with 2-3 mm of anterolisthesis.   Labs     She finished Doxy 12/26 She has been doing very well No back pain except when she does a lot of yard work [employer] more energy  Past Medical History:  Diagnosis Date   Dermatomyositis (HCC)    MRSA (methicillin resistant Staphylococcus aureus)    Polyarthritis     Past Surgical History:  Procedure Laterality Date   INCISION AND DRAINAGE OF WOUND Left  07/14/2021   Procedure: IRRIGATION AND DEBRIDEMENT WOUND;  Surgeon: Rosetta Posner, DPM;  Location: [facility];  Service: Podiatry;  Laterality: Left;   IR RADIOLOGIST EVAL & MGMT  07/29/2021   LUMBAR LAMINECTOMY FOR EPIDURAL ABSCESS N/A 07/16/2021   Procedure: LUMBAR LAMINECTOMY FOR EPIDURAL ABSCESS;  Surgeon: Venetia Night, MD;  Location: [facility];  Service: Neurosurgery;  Laterality: N/A;   LUMBAR LAMINECTOMY FOR EPIDURAL ABSCESS N/A 09/29/2022   Procedure: LUMBAR LAMINECTOMY FOR EPIDURAL ABSCESS;  Surgeon: Loreen Freud, MD;  Location: [facility];  Service: Neurosurgery;  Laterality: N/A;   OSTECTOMY Left 07/14/2021   Procedure: JOINT RESECTION;  Surgeon: Rosetta Posner, DPM;  Location: [facility];  Service: Podiatry;  Laterality: Left;    Social History   Socioeconomic History   Marital status: Married    Spouse name: Not on file   Number of children: Not on file   Years of education: Not on file   Highest education level: Not on file  Occupational History   Not on file  Tobacco Use   Smoking status: Every Day    Current packs/day: 0.00    Types: Cigarettes    Last attempt to quit: 07/14/2021    Years since quitting: 1.8   Smokeless tobacco: Never  Substance and Sexual Activity   Alcohol use: No   Drug use: Never   Sexual activity: Yes  Other Topics Concern   Not on file  Social History Narrative   Not on file   Social Drivers of Health   Financial Resource Strain: Low Risk  (02/16/2023)   Received from  [facility]   Overall Financial Resource Strain (CARDIA)    Difficulty of Paying Living Expenses: Not hard at all  Food Insecurity: No Food Insecurity (02/16/2023)   Received from [facility]   Hunger Vital Sign    Worried About Running Out of Food in the Last Year: Never true    Ran Out of Food in the Last Year: Never true  Transportation Needs: No Transportation Needs (02/16/2023)   Received from [facility] - Transportation    In the past 12 months, has lack of transportation kept you from medical appointments or from getting medications?: No    Lack of Transportation (Non-Medical): No  Physical Activity: Not on file  Stress: Not on file  Social Connections: Not on file  Intimate Partner Violence: Patient Declined (09/23/2022)   Humiliation, Afraid, Rape, and Kick questionnaire    Fear of Current or Ex-Partner: Patient declined    Emotionally Abused: Patient declined    Physically Abused: Patient declined    Sexually Abused: Patient declined    Family History  Problem Relation Age of Onset   Breast cancer Mother    Hypertension Father    Alzheimer's disease Father    Gout Father    Allergies  Allergen Reactions   Sulfa Antibiotics Rash   I? Current Outpatient Medications  Medication Sig Dispense Refill   acetaminophen (TYLENOL) 325 MG tablet Take 2 tablets (650 mg total) by mouth every 6 (six) hours as needed for mild pain.     acyclovir (ZOVIRAX) 400 MG tablet Take 400 mg by mouth 3 (three) times daily.  TAKE 1 TABLET (400 MG TOTAL) BY MOUTH 3 (THREE) TIMES DAILY FOR 5 DAYS AS NEEDED FOR OUTBREAK     amLODipine (NORVASC) 5 MG tablet Take 1 tablet by mouth daily.     augmented betamethasone dipropionate (DIPROLENE-AF) 0.05 % cream Apply 1 application  topically daily as needed.     doxycycline (ADOXA) 100 MG tablet Take 1 tablet (100 mg total) by mouth 2 (two) times daily. 60 tablet 3   esomeprazole (NEXIUM) 40 MG capsule Take 1 capsule (40 mg total) by mouth 2 (two) times daily.     folic acid (FOLVITE) 1 MG tablet Take 1 mg by mouth daily.     hydroxychloroquine (PLAQUENIL) 200 MG tablet Take by mouth.     losartan (COZAAR) 100 MG tablet Take 1 tablet (100 mg total) by mouth daily. 30 tablet 0   rosuvastatin (CRESTOR) 10 MG tablet Take 10 mg by mouth daily.     No current facility-administered medications for this visit.     Abtx:  Anti-infectives (From admission,  onward)    None       REVIEW OF SYSTEMS:  Const: negative fever, negative chills, trying to gain weight  Eyes: negative diplopia or visual changes, negative eye pain ENT: negative coryza, negative sore throat Resp: negative cough, hemoptysis, dyspnea Cards: negative for chest pain, palpitations, lower extremity edema GU: negative for frequency, dysuria and hematuria GI: Negative for abdominal pain, diarrhea, bleeding, constipation Skin: negative for rash and pruritus Heme: negative for easy bruising and gum/nose bleeding MS: negative for myalgias, arthralgias, back pain and muscle weakness Neurolo:negative for headaches, dizziness, vertigo, memory problems  Psych: negative for feelings of anxiety, depression  Endocrine: negative for thyroid, diabetes Allergy/Immunology-sulfa Objective:  VITALS:  BP (!) 144/89   Pulse 79   Temp 98.2 F (36.8 C) (Temporal)   Wt 126  lb (57.2 kg)   BMI 21.63 kg/m   PHYSICAL EXAM:  General: Alert, cooperative, no distress, appears stated age.  Head: Normocephalic, without obvious abnormality, atraumatic. Eyes: Conjunctivae clear, anicteric sclerae. Pupils are equal ENT Nares normal. No drainage or sinus tenderness. Lips, mucosa, and tongue normal. No Thrush Neck: Supple, symmetrical, no adenopathy, thyroid: non tender no carotid bruit and no JVD. Back: Surgicalscarooks well.   Lungs: Clear to auscultation bilaterally. No Wheezing or Rhonchi. No rales. Heart: Regular rate and rhythm, no murmur, rub or gallop. Abdomen: Soft, non-tender,not distended. Bowel sounds normal. No masses Extremities: atraumatic, no cyanosis. No edema. No clubbing Skin: No rashes or lesions. Or bruising Lymph: Cervical, supraclavicular normal. Neurologic: Grossly non-focal Pertinent Labs 12/15/22 Sed rate 11 Crp 1  ? Impression/Recommendation MRSA infection extensive infection of the lumbar spine with bilateral paraspinal abscess, left psoas abscess which was  drained, epidural abscess involving L3-L4 and L4-L5 and facet joint septic arthritis on the left L3 L5 She underwent laminectomy of L3-L4 and washout.  She also had washout of the right paraspinal abscess on 09/29/2022  Completed 6 weeks of IV Vancomycin until 11/09/2022.followed by 14 weeks of Doxy- Repeat MRI on 02/08/23 clear of any infection She completed doxy on 02/23/24 Today ESR CRP remains normal     Dermatomyositis/Polymyositis  Herpes Simplex Virus Last outbreak approximately 1 month ago. Patient is on Acyclovir as needed for outbreaks. -Continue Acyclovir as needed for outbreaks.    Will follow PRN __________________________________________________

## 2023-05-12 ENCOUNTER — Telehealth: Payer: Self-pay

## 2023-05-12 NOTE — Telephone Encounter (Signed)
-----   Message from Lynn Ito sent at 05/11/2023  5:19 PM EDT ----- Please let her know tomorrow that ESR/CRP looks great- no evidence of inflammation- thx ----- Message ----- From: Interface, Lab In [place] Sent: 05/11/2023   9:35 AM EDT To: Lynn Ito, MD

## 2023-05-12 NOTE — Telephone Encounter (Signed)
 Patient informed of labs and verbalized understanding. Natale Barba Jonathon Resides, CMA

## 2023-06-21 ENCOUNTER — Ambulatory Visit: Payer: Medicare Other | Admitting: Neurosurgery

## 2024-03-11 ENCOUNTER — Other Ambulatory Visit: Payer: Self-pay | Admitting: Family Medicine

## 2024-03-11 DIAGNOSIS — Z780 Asymptomatic menopausal state: Secondary | ICD-10-CM

## 2024-03-11 DIAGNOSIS — Z1231 Encounter for screening mammogram for malignant neoplasm of breast: Secondary | ICD-10-CM
